# Patient Record
Sex: Female | Born: 1940 | Race: White | Hispanic: No | Smoking: Never smoker
Health system: Southern US, Community
[De-identification: ages and names within clinical notes are randomized; demographics above are authoritative.]

## PROBLEM LIST (undated history)

## (undated) DIAGNOSIS — H269 Unspecified cataract: Secondary | ICD-10-CM

## (undated) DIAGNOSIS — H3530 Unspecified macular degeneration: Secondary | ICD-10-CM

## (undated) DIAGNOSIS — G8929 Other chronic pain: Secondary | ICD-10-CM

## (undated) DIAGNOSIS — R7303 Prediabetes: Secondary | ICD-10-CM

## (undated) DIAGNOSIS — Z972 Presence of dental prosthetic device (complete) (partial): Secondary | ICD-10-CM

## (undated) DIAGNOSIS — F32A Depression, unspecified: Secondary | ICD-10-CM

## (undated) DIAGNOSIS — F419 Anxiety disorder, unspecified: Secondary | ICD-10-CM

## (undated) DIAGNOSIS — R51 Headache: Secondary | ICD-10-CM

## (undated) DIAGNOSIS — K5792 Diverticulitis of intestine, part unspecified, without perforation or abscess without bleeding: Secondary | ICD-10-CM

## (undated) DIAGNOSIS — F329 Major depressive disorder, single episode, unspecified: Secondary | ICD-10-CM

## (undated) DIAGNOSIS — K5790 Diverticulosis of intestine, part unspecified, without perforation or abscess without bleeding: Secondary | ICD-10-CM

## (undated) DIAGNOSIS — D649 Anemia, unspecified: Secondary | ICD-10-CM

## (undated) DIAGNOSIS — G43909 Migraine, unspecified, not intractable, without status migrainosus: Secondary | ICD-10-CM

## (undated) DIAGNOSIS — I4510 Unspecified right bundle-branch block: Secondary | ICD-10-CM

## (undated) DIAGNOSIS — H547 Unspecified visual loss: Secondary | ICD-10-CM

## (undated) DIAGNOSIS — K649 Unspecified hemorrhoids: Secondary | ICD-10-CM

## (undated) DIAGNOSIS — I10 Essential (primary) hypertension: Secondary | ICD-10-CM

## (undated) DIAGNOSIS — R197 Diarrhea, unspecified: Secondary | ICD-10-CM

## (undated) DIAGNOSIS — Z780 Asymptomatic menopausal state: Secondary | ICD-10-CM

## (undated) DIAGNOSIS — Z8619 Personal history of other infectious and parasitic diseases: Secondary | ICD-10-CM

## (undated) DIAGNOSIS — K219 Gastro-esophageal reflux disease without esophagitis: Secondary | ICD-10-CM

## (undated) DIAGNOSIS — M1990 Unspecified osteoarthritis, unspecified site: Secondary | ICD-10-CM

## (undated) DIAGNOSIS — Z87440 Personal history of urinary (tract) infections: Secondary | ICD-10-CM

## (undated) DIAGNOSIS — R519 Headache, unspecified: Secondary | ICD-10-CM

## (undated) DIAGNOSIS — IMO0001 Reserved for inherently not codable concepts without codable children: Secondary | ICD-10-CM

## (undated) DIAGNOSIS — Z9289 Personal history of other medical treatment: Secondary | ICD-10-CM

## (undated) HISTORY — DX: Unspecified right bundle-branch block: I45.10

## (undated) HISTORY — PX: ESOPHAGOGASTRODUODENOSCOPY: SHX1529

## (undated) HISTORY — PX: DILATION AND CURETTAGE OF UTERUS: SHX78

## (undated) HISTORY — DX: Essential (primary) hypertension: I10

## (undated) HISTORY — PX: KNEE ARTHROSCOPY: SUR90

## (undated) HISTORY — DX: Migraine, unspecified, not intractable, without status migrainosus: G43.909

## (undated) HISTORY — PX: COLONOSCOPY: SHX174

## (undated) HISTORY — PX: EYE SURGERY: SHX253

## (undated) HISTORY — PX: FOOT SURGERY: SHX648

## (undated) HISTORY — DX: Unspecified osteoarthritis, unspecified site: M19.90

---
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
## 1977-10-07 HISTORY — PX: NASAL SINUS SURGERY: SHX719

## 1981-10-07 HISTORY — PX: TOTAL ABDOMINAL HYSTERECTOMY: SHX209

## 1993-10-07 HISTORY — PX: CHOLECYSTECTOMY: SHX55

## 2008-10-07 HISTORY — PX: CATARACT EXTRACTION: SUR2

## 2012-01-02 ENCOUNTER — Encounter (INDEPENDENT_AMBULATORY_CARE_PROVIDER_SITE_OTHER): Payer: Self-pay | Admitting: *Deleted

## 2012-01-14 ENCOUNTER — Encounter (INDEPENDENT_AMBULATORY_CARE_PROVIDER_SITE_OTHER): Payer: Self-pay | Admitting: Internal Medicine

## 2012-01-14 ENCOUNTER — Ambulatory Visit (INDEPENDENT_AMBULATORY_CARE_PROVIDER_SITE_OTHER): Payer: Self-pay | Admitting: Internal Medicine

## 2012-01-14 ENCOUNTER — Other Ambulatory Visit (INDEPENDENT_AMBULATORY_CARE_PROVIDER_SITE_OTHER): Payer: Self-pay | Admitting: *Deleted

## 2012-01-14 VITALS — BP 164/100 | HR 76 | Temp 98.4°F | Ht 65.5 in | Wt 194.1 lb

## 2012-01-14 DIAGNOSIS — R194 Change in bowel habit: Secondary | ICD-10-CM

## 2012-01-14 DIAGNOSIS — R1031 Right lower quadrant pain: Secondary | ICD-10-CM

## 2012-01-14 DIAGNOSIS — M1990 Unspecified osteoarthritis, unspecified site: Secondary | ICD-10-CM | POA: Insufficient documentation

## 2012-01-14 DIAGNOSIS — R197 Diarrhea, unspecified: Secondary | ICD-10-CM

## 2012-01-14 DIAGNOSIS — I10 Essential (primary) hypertension: Secondary | ICD-10-CM | POA: Insufficient documentation

## 2012-01-14 DIAGNOSIS — R198 Other specified symptoms and signs involving the digestive system and abdomen: Secondary | ICD-10-CM

## 2012-01-14 DIAGNOSIS — K219 Gastro-esophageal reflux disease without esophagitis: Secondary | ICD-10-CM

## 2012-01-14 NOTE — Patient Instructions (Signed)
EGD/ED and colonoscopy

## 2012-01-14 NOTE — Progress Notes (Signed)
Subjective:     Patient ID: Bonnie Gray, female   DOB: 02-Apr-1941, 71 y.o.   MRN: [id number]  HPI Bonnie Gray is a 71 yr old female referred to our office by Dr. Meredith Mody for rt abdominal pain and diarrhea since February  She was given Nexium which helped.  She started having diarrhea.  She would have 15 loose stools a day. She saw Dr Willaim Bane again and was given Robinul 1mg  .  Now she usually has 5-15 stools a day. She usually only has 2 a day.  Her first BM is normal, then the second one will be looser, and as the day goes on her stools become more looser. She has a lot of indigestion and gas. When she goes to be at night, she sleeps on her rt side. She feels like she is lying on a sock or a rock. She feels a pressure there.   NO melena or bright red rectal bleeding. Her last colonoscopy was in early 2000 by Dr. Cleotis Nipper and was normal.   She also c/o that foods are slow to go down. She will quit eating and the food will eventually go down.  After she eats she will bloat. She underwent an EGD in the 70's for dysphagia and was told her pylorus was very small.   Review of Systems see hpi Current Outpatient Prescriptions  Medication Sig Dispense Refill  . aluminum hydroxide-magnesium carbonate (GAVISCON) 31.7-137 MG/5ML suspension Take by mouth every 6 (six) hours as needed.      Marland Kitchen aspirin 81 MG tablet Take 81 mg by mouth daily.      . Azilsartan-Chlorthalidone (EDARBYCLOR) 40-12.5 MG TABS Take by mouth.      . beta carotene w/minerals (OCUVITE) tablet Take 1 tablet by mouth daily.      . Cholecalciferol (VITAMIN D-3 PO) Take by mouth.      Marland Kitchen glycopyrrolate (ROBINUL) 1 MG tablet Take 1 mg by mouth 3 (three) times daily.      Marland Kitchen LORazepam (ATIVAN) 1 MG tablet Take 1 mg by mouth every 8 (eight) hours.      . Multiple Vitamins-Minerals (CENTRUM SILVER PO) Take by mouth.      . Multiple Vitamins-Minerals (PRESERVISION AREDS) TABS Take by mouth.      . naproxen sodium (ANAPROX) 220 MG tablet Take 220  mg by mouth 2 (two) times daily with a meal.      . phenylephrine-shark liver oil-mineral oil-petrolatum (PREPARATION H) 0.25-3-14-71.9 % rectal ointment Place rectally 2 (two) times daily as needed.       Past Medical History  Diagnosis Date  . Hypertension   . Osteoarthritis     knees   Past Surgical History  Procedure Date  . Knee arthroscopy     2009  . Cholecystectomy   . Total abdominal hysterectomy     endometriosis  . Foot surgery     bunion and growth in arch. ( left foot)  . Appendectomy    History   Social History  . Marital Status: Divorced    Spouse Name: N/A    Number of Children: N/A  . Years of Education: N/A   Occupational History  . Not on file.   Social History Main Topics  . Smoking status: Never Smoker   . Smokeless tobacco: Not on file  . Alcohol Use: No  . Drug Use: No  . Sexually Active: Not on file   Other Topics Concern  . Not on file   Social  History Narrative  . No narrative on file      Objective:   Physical Exam Filed Vitals:   01/14/12 1115  Height: 5' 5.5" (1.664 m)  Weight: 194 lb 1.6 oz (88.043 kg)  Alert and oriented. Skin warm and dry. Oral mucosa is moist.   . Sclera anicteric, conjunctivae is pink. Thyroid not enlarged. No cervical lymphadenopathy. Lungs clear. Heart regular rate and rhythm.  Abdomen is soft. Bowel sounds are positive. No hepatomegaly. No abdominal masses felt. No tenderness.  No edema to lower extremities. Patient is alert and oriented.       Assessment:    Diarrhea, rt lower abdominal pain. Colonic neoplasm needs to be ruled out. Inflammatory process also needs to be ruled out.  Dysphagia: Esophageal stricture, ring needs to be ruled out.    Plan:    EGD/Colonoscopy in the near future.The risks and benefits such as perforation, bleeding, and infection were reviewed with the patient and is agreeable.   Rx for Nexium sent to her pharmacy

## 2012-01-15 ENCOUNTER — Encounter (HOSPITAL_COMMUNITY): Payer: Self-pay | Admitting: Pharmacy Technician

## 2012-01-15 MED ORDER — SODIUM CHLORIDE 0.45 % IV SOLN
Freq: Once | INTRAVENOUS | Status: AC
  Administered 2012-01-16: 12:00:00 via INTRAVENOUS

## 2012-01-16 ENCOUNTER — Encounter (HOSPITAL_COMMUNITY)
Admission: RE | Disposition: A | Discharge status: Home / Self Care | Payer: Self-pay | Source: Ambulatory Visit | Attending: Internal Medicine

## 2012-01-16 ENCOUNTER — Ambulatory Visit (HOSPITAL_COMMUNITY)
Admission: RE | Admit: 2012-01-16 | Discharge: 2012-01-16 | Disposition: A | Discharge status: Home / Self Care | Payer: Medicare Other | Source: Ambulatory Visit | Attending: Internal Medicine | Admitting: Internal Medicine

## 2012-01-16 ENCOUNTER — Encounter (HOSPITAL_COMMUNITY): Payer: Self-pay | Admitting: *Deleted

## 2012-01-16 DIAGNOSIS — R1031 Right lower quadrant pain: Secondary | ICD-10-CM

## 2012-01-16 DIAGNOSIS — K222 Esophageal obstruction: Secondary | ICD-10-CM | POA: Insufficient documentation

## 2012-01-16 DIAGNOSIS — D126 Benign neoplasm of colon, unspecified: Secondary | ICD-10-CM

## 2012-01-16 DIAGNOSIS — R197 Diarrhea, unspecified: Secondary | ICD-10-CM

## 2012-01-16 DIAGNOSIS — R194 Change in bowel habit: Secondary | ICD-10-CM

## 2012-01-16 DIAGNOSIS — Z79899 Other long term (current) drug therapy: Secondary | ICD-10-CM | POA: Insufficient documentation

## 2012-01-16 DIAGNOSIS — K2100 Gastro-esophageal reflux disease with esophagitis, without bleeding: Secondary | ICD-10-CM | POA: Insufficient documentation

## 2012-01-16 DIAGNOSIS — K208 Other esophagitis without bleeding: Secondary | ICD-10-CM

## 2012-01-16 DIAGNOSIS — K449 Diaphragmatic hernia without obstruction or gangrene: Secondary | ICD-10-CM

## 2012-01-16 DIAGNOSIS — K219 Gastro-esophageal reflux disease without esophagitis: Secondary | ICD-10-CM

## 2012-01-16 DIAGNOSIS — R1310 Dysphagia, unspecified: Secondary | ICD-10-CM

## 2012-01-16 DIAGNOSIS — K644 Residual hemorrhoidal skin tags: Secondary | ICD-10-CM | POA: Insufficient documentation

## 2012-01-16 DIAGNOSIS — I10 Essential (primary) hypertension: Secondary | ICD-10-CM | POA: Insufficient documentation

## 2012-01-16 DIAGNOSIS — K6389 Other specified diseases of intestine: Secondary | ICD-10-CM

## 2012-01-16 HISTORY — DX: Diarrhea, unspecified: R19.7

## 2012-01-16 HISTORY — PX: MALONEY DILATION: SHX5535

## 2012-01-16 SURGERY — COLONOSCOPY WITH ESOPHAGOGASTRODUODENOSCOPY (EGD)
Anesthesia: Moderate Sedation

## 2012-01-16 MED ORDER — MIDAZOLAM HCL 5 MG/5ML IJ SOLN
INTRAMUSCULAR | Status: DC | PRN
  Administered 2012-01-16: 1 mg via INTRAVENOUS
  Administered 2012-01-16 (×2): 2 mg via INTRAVENOUS
  Administered 2012-01-16 (×2): 1 mg via INTRAVENOUS
  Administered 2012-01-16: 2 mg via INTRAVENOUS
  Administered 2012-01-16: 1 mg via INTRAVENOUS

## 2012-01-16 MED ORDER — MEPERIDINE HCL 50 MG/ML IJ SOLN
INTRAMUSCULAR | Status: AC
  Filled 2012-01-16: qty 1

## 2012-01-16 MED ORDER — DICYCLOMINE HCL 10 MG PO CAPS: 10.0000 mg | ORAL_CAPSULE | Freq: Three times a day (TID) | ORAL | Status: DC

## 2012-01-16 MED ORDER — MEPERIDINE HCL 50 MG/ML IJ SOLN
INTRAMUSCULAR | Status: DC | PRN
Start: 2012-01-16 — End: 2012-01-16
  Administered 2012-01-16 (×4): 25 mg via SUBCUTANEOUS

## 2012-01-16 MED ORDER — MIDAZOLAM HCL 5 MG/5ML IJ SOLN
INTRAMUSCULAR | Status: AC
  Filled 2012-01-16: qty 10

## 2012-01-16 MED ORDER — BUTAMBEN-TETRACAINE-BENZOCAINE 2-2-14 % EX AERO
INHALATION_SPRAY | CUTANEOUS | Status: DC | PRN
  Administered 2012-01-16: 2 via TOPICAL

## 2012-01-16 NOTE — Discharge Instructions (Signed)
No aspirin for one week. Dicyclomine 10 mg by mouth daily before. Stop Robinul. No driving for 24 hours. Continue anti-reflux measures and Nexium as before. Physician will contact you with biopsy resultsGastroesophageal Reflux Disease, Adult Gastroesophageal reflux disease (GERD) happens when acid from your stomach flows up into the esophagus. When acid comes in contact with the esophagus, the acid causes soreness (inflammation) in the esophagus. Over time, GERD may create small holes (ulcers) in the lining of the esophagus. CAUSES   Increased body weight. This puts pressure on the stomach, making acid rise from the stomach into the esophagus.   Smoking. This increases acid production in the stomach.   Drinking alcohol. This causes decreased pressure in the lower esophageal sphincter (valve or ring of muscle between the esophagus and stomach), allowing acid from the stomach into the esophagus.   Late evening meals and a full stomach. This increases pressure and acid production in the stomach.   A malformed lower esophageal sphincter.  Sometimes, no cause is found. SYMPTOMS   Burning pain in the lower part of the mid-chest behind the breastbone and in the mid-stomach area. This may occur twice a week or more often.   Trouble swallowing.   Sore throat.   Dry cough.   Asthma-like symptoms including chest tightness, shortness of breath, or wheezing.  DIAGNOSIS  Your caregiver may be able to diagnose GERD based on your symptoms. In some cases, X-rays and other tests may be done to check for complications or to check the condition of your stomach and esophagus. TREATMENT  Your caregiver may recommend over-the-counter or prescription medicines to help decrease acid production. Ask your caregiver before starting or adding any new medicines.  HOME CARE INSTRUCTIONS   Change the factors that you can control. Ask your caregiver for guidance concerning weight loss, quitting smoking, and  alcohol consumption.   Avoid foods and drinks that make your symptoms worse, such as:   Caffeine or alcoholic drinks.   Chocolate.   Peppermint or mint flavorings.   Garlic and onions.   Spicy foods.   Citrus fruits, such as oranges, lemons, or limes.   Tomato-based foods such as sauce, chili, salsa, and pizza.   Fried and fatty foods.   Avoid lying down for the 3 hours prior to your bedtime or prior to taking a nap.   Eat small, frequent meals instead of large meals.   Wear loose-fitting clothing. Do not wear anything tight around your waist that causes pressure on your stomach.   Raise the head of your bed 6 to 8 inches with wood blocks to help you sleep. Extra pillows will not help.   Only take over-the-counter or prescription medicines for pain, discomfort, or fever as directed by your caregiver.   Do not take aspirin, ibuprofen, or other nonsteroidal anti-inflammatory drugs (NSAIDs).  SEEK IMMEDIATE MEDICAL CARE IF:   You have pain in your arms, neck, jaw, teeth, or back.   Your pain increases or changes in intensity or duration.   You develop nausea, vomiting, or sweating (diaphoresis).   You develop shortness of breath, or you faint.   Your vomit is green, yellow, black, or looks like coffee grounds or blood.   Your stool is red, bloody, or black.  These symptoms could be signs of other problems, such as heart disease, gastric bleeding, or esophageal bleeding. MAKE SURE YOU:   Understand these instructions.   Will watch your condition.   Will get help right away if you are  not doing well or get worse.  Document Released: 07/03/2005 Document Revised: 09/12/2011 Document Reviewed: 04/12/2011 [facility] Patient Information 2012 [place], [place].Hemorrhoids Hemorrhoids are enlarged (dilated) veins around the rectum. There are 2 types of hemorrhoids, and the type of hemorrhoid is determined by its location. Internal hemorrhoids occur in the veins just inside the  rectum.They are usually not painful, but they may bleed.However, they may poke through to the outside and become irritated and painful. External hemorrhoids involve the veins outside the anus and can be felt as a painful swelling or hard lump near the anus.They are often itchy and may crack and bleed. Sometimes clots will form in the veins. This makes them swollen and painful. These are called thrombosed hemorrhoids. CAUSES Causes of hemorrhoids include:  Pregnancy. This increases the pressure in the hemorrhoidal veins.   Constipation.   Straining to have a bowel movement.   Obesity.   Heavy lifting or other activity that caused you to strain.  TREATMENT Most of the time hemorrhoids improve in 1 to 2 weeks. However, if symptoms do not seem to be getting better or if you have a lot of rectal bleeding, your caregiver may perform a procedure to help make the hemorrhoids get smaller or remove them completely.Possible treatments include:  Rubber band ligation. A rubber band is placed at the base of the hemorrhoid to cut off the circulation.   Sclerotherapy. A chemical is injected to shrink the hemorrhoid.   Infrared light therapy. Tools are used to burn the hemorrhoid.   Hemorrhoidectomy. This is surgical removal of the hemorrhoid.  HOME CARE INSTRUCTIONS   Increase fiber in your diet. Ask your caregiver about using fiber supplements.   Drink enough water and fluids to keep your urine clear or pale yellow.   Exercise regularly.   Go to the bathroom when you have the urge to have a bowel movement. Do not wait.   Avoid straining to have bowel movements.   Keep the anal area dry and clean.   Only take over-the-counter or prescription medicines for pain, discomfort, or fever as directed by your caregiver.  If your hemorrhoids are thrombosed:  Take warm sitz baths for 20 to 30 minutes, 3 to 4 times per day.   If the hemorrhoids are very tender and swollen, place ice packs on the  area as tolerated. Using ice packs between sitz baths may be helpful. Fill a plastic bag with ice. Place a towel between the bag of ice and your skin.   Medicated creams and suppositories may be used or applied as directed.   Do not use a donut-shaped pillow or sit on the toilet for long periods. This increases blood pooling and pain.  SEEK MEDICAL CARE IF:   You have increasing pain and swelling that is not controlled with your medicine.   You have uncontrolled bleeding.   You have difficulty or you are unable to have a bowel movement.   You have pain or inflammation outside the area of the hemorrhoids.   You have chills or an oral temperature above 102 F (38.9 C).  MAKE SURE YOU:   Understand these instructions.   Will watch your condition.   Will get help right away if you are not doing well or get worse.  Document Released: 09/20/2000 Document Revised: 09/12/2011 Document Reviewed: 01/26/2008 [facility] Patient Information 2012 [place], [place].Diverticulosis Diverticulosis is a common condition that develops when small pouches (diverticula) form in the wall of the colon. The risk of diverticulosis increases  with age. It happens more often in people who eat a low-fiber diet. Most individuals with diverticulosis have no symptoms. Those individuals with symptoms usually experience abdominal pain, constipation, or loose stools (diarrhea). HOME CARE INSTRUCTIONS   Increase the amount of fiber in your diet as directed by your caregiver or dietician. This may reduce symptoms of diverticulosis.   Your caregiver may recommend taking a dietary fiber supplement.   Drink at least 6 to 8 glasses of water each day to prevent constipation.   Try not to strain when you have a bowel movement.   Your caregiver may recommend avoiding nuts and seeds to prevent complications, although this is still an uncertain benefit.   Only take over-the-counter or prescription medicines for pain, discomfort,  or fever as directed by your caregiver.  FOODS WITH HIGH FIBER CONTENT INCLUDE:  Fruits. Apple, peach, pear, tangerine, raisins, prunes.   Vegetables. Brussels sprouts, asparagus, broccoli, cabbage, carrot, cauliflower, romaine lettuce, spinach, summer squash, tomato, winter squash, zucchini.   Starchy Vegetables. Baked beans, kidney beans, lima beans, split peas, lentils, potatoes (with skin).   Grains. Whole wheat bread, brown rice, bran flake cereal, plain oatmeal, white rice, shredded wheat, bran muffins.  SEEK IMMEDIATE MEDICAL CARE IF:   You develop increasing pain or severe bloating.   You have an oral temperature above 102 F (38.9 C), not controlled by medicine.   You develop vomiting or bowel movements that are bloody or black.  Document Released: 06/20/2004 Document Revised: 09/12/2011 Document Reviewed: 02/21/2010 [facility] Patient Information 2012 [place], [place].Colon Polyps A polyp is extra tissue that grows inside your body. Colon polyps grow in the large intestine. The large intestine, also called the colon, is part of your digestive system. It is a long, hollow tube at the end of your digestive tract where your body makes and stores stool. Most polyps are not dangerous. They are benign. This means they are not cancerous. But over time, some types of polyps can turn into cancer. Polyps that are smaller than a pea are usually not harmful. But larger polyps could someday become or may already be cancerous. To be safe, doctors remove all polyps and test them.  WHO GETS POLYPS? Anyone can get polyps, but certain people are more likely than others. You may have a greater chance of getting polyps if:  You are over 50.   You have had polyps before.   Someone in your family has had polyps.   Someone in your family has had cancer of the large intestine.   Find out if someone in your family has had polyps. You may also be more likely to get polyps if you:   Eat a lot of fatty  foods.   Smoke.   Drink alcohol.   Do not exercise.   Eat too much.  SYMPTOMS  Most small polyps do not cause symptoms. People often do not know they have one until their caregiver finds it during a regular checkup or while testing them for something else. Some people do have symptoms like these:  Bleeding from the anus. You might notice blood on your underwear or on toilet paper after you have had a bowel movement.   Constipation or diarrhea that lasts more than a week.   Blood in the stool. Blood can make stool look black or it can show up as red streaks in the stool.  If you have any of these symptoms, see your caregiver. HOW DOES THE DOCTOR TEST FOR POLYPS? The  doctor can use four tests to check for polyps:  Digital rectal exam. The caregiver wears gloves and checks your rectum (the last part of the large intestine) to see if it feels normal. This test would find polyps only in the rectum. Your caregiver may need to do one of the other tests listed below to find polyps higher up in the intestine.   Barium enema. The caregiver puts a liquid called barium into your rectum before taking x-rays of your large intestine. Barium makes your intestine look white in the pictures. Polyps are dark, so they are easy to see.   Sigmoidoscopy. With this test, the caregiver can see inside your large intestine. A thin flexible tube is placed into your rectum. The device is called a sigmoidoscope, which has a light and a tiny video camera in it. The caregiver uses the sigmoidoscope to look at the last third of your large intestine.   Colonoscopy. This test is like sigmoidoscopy, but the caregiver looks at all of the large intestine. It usually requires sedation. This is the most common method for finding and removing polyps.  TREATMENT   The caregiver will remove the polyp during sigmoidoscopy or colonoscopy. The polyp is then tested for cancer.   If you have had polyps, your caregiver may want you  to get tested regularly in the future.  PREVENTION  There is not one sure way to prevent polyps. You might be able to lower your risk of getting them if you:  Eat more fruits and vegetables and less fatty food.   Do not smoke.   Avoid alcohol.   Exercise every day.   Lose weight if you are overweight.   Eating more calcium and folate can also lower your risk of getting polyps. Some foods that are rich in calcium are milk, cheese, and broccoli. Some foods that are rich in folate are chickpeas, kidney beans, and spinach.   Aspirin might help prevent polyps. Studies are under way.  Document Released: 06/19/2004 Document Revised: 09/12/2011 Document Reviewed: 11/25/2007 [facility] Patient Information 2012 [place], [place].Esophagogastroduodenoscopy This is an endoscopic procedure (a procedure that uses a device like a flexible telescope) that allows your caregiver to view the upper stomach and small bowel. This test allows your caregiver to look at the esophagus. The esophagus carries food from your mouth to your stomach. They can also look at your duodenum. This is the first part of the small intestine that attaches to the stomach. This test is used to detect problems in the bowel such as ulcers and inflammation. PREPARATION FOR TEST Nothing to eat after midnight the day before the test. NORMAL FINDINGS Normal esophagus, stomach, and duodenum. Ranges for normal findings may vary among different laboratories and hospitals. You should always check with your doctor after having lab work or other tests done to discuss the meaning of your test results and whether your values are considered within normal limits. MEANING OF TEST  Your caregiver will go over the test results with you and discuss the importance and meaning of your results, as well as treatment options and the need for additional tests if necessary. OBTAINING THE TEST RESULTS It is your responsibility to obtain your test results. Ask the  lab or department performing the test when and how you will get your results. Document Released: 01/24/2005 Document Revised: 09/12/2011 Document Reviewed: 09/02/2008 [facility] Patient Information 2012 [place], [place].Colonoscopy Care After Read the instructions outlined below and refer to this sheet in the next few weeks.  These discharge instructions provide you with general information on caring for yourself after you leave the hospital. Your doctor may also give you specific instructions. While your treatment has been planned according to the most current medical practices available, unavoidable complications occasionally occur. If you have any problems or questions after discharge, call your doctor. HOME CARE INSTRUCTIONS ACTIVITY:  You may resume your regular activity, but move at a slower pace for the next 24 hours.   Take frequent rest periods for the next 24 hours.   Walking will help get rid of the air and reduce the bloated feeling in your belly (abdomen).   No driving for 24 hours (because of the medicine (anesthesia) used during the test).   You may shower.   Do not sign any important legal documents or operate any machinery for 24 hours (because of the anesthesia used during the test).  NUTRITION:  Drink plenty of fluids.   You may resume your normal diet as instructed by your doctor.   Begin with a light meal and progress to your normal diet. Heavy or fried foods are harder to digest and may make you feel sick to your stomach (nauseated).   Avoid alcoholic beverages for 24 hours or as instructed.  MEDICATIONS:  You may resume your normal medications unless your doctor tells you otherwise.  WHAT TO EXPECT TODAY:  Some feelings of bloating in the abdomen.   Passage of more gas than usual.   Spotting of blood in your stool or on the toilet paper.  IF YOU HAD POLYPS REMOVED DURING THE COLONOSCOPY:  No aspirin products for 7 days or as instructed.   No alcohol for 7  days or as instructed.   Eat a soft diet for the next 24 hours.  FINDING OUT THE RESULTS OF YOUR TEST Not all test results are available during your visit. If your test results are not back during the visit, make an appointment with your caregiver to find out the results. Do not assume everything is normal if you have not heard from your caregiver or the medical facility. It is important for you to follow up on all of your test results.  SEEK IMMEDIATE MEDICAL CARE IF:  You have more than a spotting of blood in your stool.   Your belly is swollen (abdominal distention).   You are nauseated or vomiting.   You have a fever.   You have abdominal pain or discomfort that is severe or gets worse throughout the day.  Document Released: 05/07/2004 Document Revised: 09/12/2011 Document Reviewed: 05/05/2008 [facility] Patient Information 2012 [place], [place].

## 2012-01-16 NOTE — Op Note (Signed)
EGD PROCEDURE REPORT  PATIENT:  Bonnie Gray  MR#:  [id number] Birthdate:  1941/07/10, 71 y.o., female Endoscopist:  Dr. Malissa Hippo, MD Referred By:  Dr. Ardyth Man, M.D. Procedure Date: 01/16/2012  Procedure:   EGD, ED & Colonoscopy.  Indications:  Patient is a 71 year old Caucasian female who was dysphagia primarily to solids. She also has diarrhea of 4 months duration.             Informed Consent:  The risks, benefits, alternatives & imponderables which include, but are not limited to, bleeding, infection, perforation, drug reaction and potential missed lesion have been reviewed.  The potential for biopsy, lesion removal, esophageal dilation, etc. have also been discussed.  Questions have been answered.  All parties agreeable.  Please see history & physical in medical record for more information.  Medications:  Demerol 100 mg IV Versed 10 mg IV Cetacaine spray topically for oropharyngeal anesthesia  EGD  Description of procedure:  The endoscope was introduced through the mouth and advanced to the second portion of the duodenum without difficulty or limitations. The mucosal surfaces were surveyed very carefully during advancement of the scope and upon withdrawal.  Findings:  Esophagus:  Mucosa of the esophagus was normal with exception of single erosion at GE junction. It was 5 cm long. Non-critical ring at GE junction. GEJ:  35 cm Hiatus:  38  cm Stomach:  Bonnie Gray was empty and distended very well with insufflation. Fold in the proximal stomach are normal. Examination mucosa body, antrum, pyloric channel, and there is, fundus and cardia was normal. Duodenum:  Normal bulbar and post bulbar mucosa.  Therapeutic/Diagnostic Maneuvers Performed:  Esophagus dilated by passing a 54 French Maloney dilator for the insertion. 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  COLONOSCOPY Description of procedure:   After a digital rectal exam was performed, that colonoscope was advanced from the anus through the rectum and colon to the area of the cecum, ileocecal valve and appendiceal orifice. The cecum was deeply intubated. These structures were well-seen and photographed for the record. From the level of the cecum and ileocecal valve, the scope was slowly and cautiously withdrawn. The mucosal surfaces were carefully surveyed utilizing scope tip to flexion to facilitate fold flattening as needed. The scope was pulled down into the rectum where a thorough exam including retroflexion was performed.  Findings:   Prep excellent. 8 mm polyp on a fold at the hepatic flexure with difficult approach. Initially it was biopsied and residual polyp snared. Mucosa of the rest of the colon was normal. 2 diverticula and sigmoid colon. MM biopsy taken from mucosa of sigmoid colon looking for microscopic or collagenous colitis. Normal rectal mucosa. Thickened anoderm and small hemorrhoids below the dentate line.  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 Esophagus dilated by passing 54 [place] Maloney dilators of the mucosal disruption and proximal esophagus indicative of web.  8 mm polyp at hepatic flexure removed with combination of biopsy and hot snare(difficult approach). No endoscopic evidence of colitis. Cozaar biopsy taken from sigmoid colon to rule out microscopic and/or collagenous colitis. Small external hemorrhoids.  Recommendations:  Standard instructions given. Discontinue Robinul and start dicyclomine 10 mg by mouth every morning. I will contact patient with results of biopsy and further recommendations.  Bonnie Gray  01/16/2012  2:00 PM  CC: Dr. Ardyth Man, MD, MD & Dr. Bonnetta Barry ref. provider found

## 2012-01-16 NOTE — H&P (Signed)
Bonnie Gray is an 71 y.o. female.   Chief Complaint: Patient is here for EGD, EGD and colonoscopy. HPI: Patient is a 71 year-old Caucasian female who presents to the GI symptoms. She complains of intermittent dysphagia primarily to solids and occasionally to liquids. She points to her suprasternal area where the food seems to hang  before it goes down. She says her heartburn is better since she was begun on Nexium for GERD she is still having intermittent heartburn. She denies epigastric pain or melena. She also complains of diarrhea of 4 months duration. She has anywhere from 5-15 stools per day. Her stool is usually performed and subsequent stools were loose. She is hematochezia she believes is coming from hemorrhoids. She also complains of right-sided abdominal pain. She feels as if she is lump in this area. She also complains of flatulence. She says she has gained 40 pounds over the last 5 years. Most of this occurred while she was caring for her sick mother. Past Medical History  Diagnosis Date  . Hypertension   . Osteoarthritis     knees  . Diarrhea   . Dysphagia     Past Surgical History  Procedure Date  . Knee arthroscopy     2009  . Cholecystectomy   . Total abdominal hysterectomy     endometriosis  . Foot surgery     bunion and growth in arch. ( left foot)  . Appendectomy   . Colonoscopy   . Esophagogastroduodenoscopy     Family History  Problem Relation Age of Onset  . Colon cancer Neg Hx    Social History:  reports that she has never smoked. She does not have any smokeless tobacco history on file. She reports that she does not drink alcohol or use illicit drugs.  Allergies:  Allergies  Allergen Reactions  . Celebrex (Celecoxib)     hives  . Norvasc (Amlodipine Besylate)     Rash    Medications Prior to Admission  Medication Dose Route Frequency Provider Last Rate Last Dose  . 0.45 % sodium chloride infusion   Intravenous Once Malissa Hippo, MD 20 mL/hr  at 01/16/12 1202    . meperidine (DEMEROL) 50 MG/ML injection           . midazolam (VERSED) 5 MG/5ML injection            No current outpatient prescriptions on file as of 01/16/2012.    No results found for this or any previous visit (from the past 48 hour(s)). No results found.  Review of Systems  Constitutional: Negative for weight loss.  Gastrointestinal: Negative for constipation and melena.    Blood pressure 143/77, pulse 90, temperature 98 F (36.7 C), temperature source Oral, resp. rate 20, height 5' 5.5" (1.664 m), weight 194 lb (87.998 kg), SpO2 97.00%. Physical Exam  Constitutional: She appears well-developed and well-nourished.  HENT:  Mouth/Throat: Oropharynx is clear and moist.  Eyes: Conjunctivae are normal. No scleral icterus.  Neck: No thyromegaly present.  Cardiovascular: Normal rate, regular rhythm and normal heart sounds.   No murmur heard. Respiratory: Effort normal and breath sounds normal.  GI: Soft. She exhibits no mass. There is Tenderness: mild tenderness at RUQ and LUQ.Marland Kitchen  Musculoskeletal: She exhibits no edema.  Lymphadenopathy:    She has no cervical adenopathy.  Neurological: She is alert.  Skin: Skin is warm and dry.     Assessment/Plan Dysphagia. Diarrhea. EGD with ED and colonoscopy  Bonnie Gray 01/16/2012, 12:55 PM

## 2012-01-22 ENCOUNTER — Encounter (INDEPENDENT_AMBULATORY_CARE_PROVIDER_SITE_OTHER): Payer: Self-pay

## 2012-01-22 ENCOUNTER — Encounter (HOSPITAL_COMMUNITY): Payer: Self-pay | Admitting: Internal Medicine

## 2012-01-25 ENCOUNTER — Telehealth (INDEPENDENT_AMBULATORY_CARE_PROVIDER_SITE_OTHER): Payer: Self-pay | Admitting: Internal Medicine

## 2012-01-26 NOTE — Telephone Encounter (Signed)
error 

## 2012-01-28 ENCOUNTER — Telehealth (INDEPENDENT_AMBULATORY_CARE_PROVIDER_SITE_OTHER): Payer: Self-pay | Admitting: *Deleted

## 2012-01-28 NOTE — Telephone Encounter (Signed)
Patient called to let Dr. Karilyn Cota know her diarrhea is better with medication and diet changes. She also said thank you for the call.

## 2012-01-28 NOTE — Telephone Encounter (Signed)
Nice to know  that patient is doing better

## 2012-01-29 ENCOUNTER — Encounter (INDEPENDENT_AMBULATORY_CARE_PROVIDER_SITE_OTHER): Payer: Self-pay | Admitting: *Deleted

## 2014-11-08 NOTE — Progress Notes (Signed)
Please put orders in Epic surgery 11-18-14 pre op 11-11-14 Thanks

## 2014-11-10 ENCOUNTER — Ambulatory Visit: Payer: Self-pay | Admitting: Orthopedic Surgery

## 2014-11-10 NOTE — Progress Notes (Signed)
Preoperative surgical orders have been place into the Epic hospital system for Bonnie Gray on 11/10/2014, 1:09 PM  by Mickel Crow for surgery on 11-18-2014.  Preop Total Knee orders including Experal, IV Tylenol, and IV Decadron as long as there are no contraindications to the above medications. [name], PA-C

## 2014-11-11 ENCOUNTER — Inpatient Hospital Stay (HOSPITAL_COMMUNITY): Admission: RE | Admit: 2014-11-11 | Payer: Self-pay | Source: Ambulatory Visit

## 2014-11-14 NOTE — Patient Instructions (Addendum)
Bonnie Gray  11/14/2014   Your procedure is scheduled on: 11/18/2014    Report to [facility] Main  Entrance and follow signs to               [place] at       130pm  Call this number if you have problems the morning of surgery [phone]   Remember:  Do not eat food after midnight. May have clear liquids until 0830am then nothing by mouth.       Take these medicines the morning of surgery with A SIP OF WATER: eye drops, loratadine, flonase                               You may not have any metal on your body including hair pins and              piercings  Do not wear jewelry, make-up, lotions, powders or perfumes.             Do not wear nail polish.  Do not shave  48 hours prior to surgery.   Do not bring valuables to the hospital. [place].  Contacts, dentures or bridgework may not be worn into surgery.  Leave suitcase in the car. After surgery it may be brought to your room.               Please read over the following fact sheets you were given: MRSA information  _____________________________________________________________________             [facility] - Preparing for Surgery Before surgery, you can play an important role.  Because skin is not sterile, your skin needs to be as free of germs as possible.  You can reduce the number of germs on your skin by washing with CHG (chlorahexidine gluconate) soap before surgery.  CHG is an antiseptic cleaner which kills germs and bonds with the skin to continue killing germs even after washing. Please DO NOT use if you have an allergy to CHG or antibacterial soaps.  If your skin becomes reddened/irritated stop using the CHG and inform your nurse when you arrive at Short Stay. Do not shave (including legs and underarms) for at least 48 hours prior to the first CHG shower.  You may shave your face/neck. Please follow these instructions  carefully:  1.  Shower with CHG Soap the night before surgery and the  morning of Surgery.  2.  If you choose to wash your hair, wash your hair first as usual with your  normal  shampoo.  3.  After you shampoo, rinse your hair and body thoroughly to remove the  shampoo.                            4.  Use CHG as you would any other liquid soap.  You can apply chg directly  to the skin and wash                       Gently with a scrungie or clean washcloth.  5.  Apply the CHG Soap to your body ONLY FROM THE NECK DOWN.   Do not  use on face/ open                           Wound or open sores. Avoid contact with eyes, ears mouth and genitals (private parts).                       Wash face,  Genitals (private parts) with your normal soap.             6.  Wash thoroughly, paying special attention to the area where your surgery  will be performed.  7.  Thoroughly rinse your body with warm water from the neck down.  8.  DO NOT shower/wash with your normal soap after using and rinsing off  the CHG Soap.                9.  Pat yourself dry with a clean towel.            10.  Wear clean pajamas.            11.  Place clean sheets on your bed the night of your first shower and do not  sleep with pets. Day of Surgery : Do not apply any lotions/deodorants the morning of surgery.  Please wear clean clothes to the hospital/surgery center.  FAILURE TO FOLLOW THESE INSTRUCTIONS MAY RESULT IN THE CANCELLATION OF YOUR SURGERY PATIENT SIGNATURE_________________________________  NURSE SIGNATURE__________________________________  ________________________________________________________________________    CLEAR LIQUID DIET   Foods Allowed                                                                     Foods Excluded  Coffee and tea, regular and decaf                             liquids that you cannot  Plain Jell-O in any flavor                                             see through such as: Fruit  ices (not with fruit pulp)                                     milk, soups, orange juice  Iced Popsicles                                    All solid food Carbonated beverages, regular and diet                                    Cranberry, grape and apple juices Sports drinks like Gatorade Lightly seasoned clear broth or consume(fat free) Sugar, honey syrup  Sample Menu Breakfast  Lunch                                     Supper Cranberry juice                    Beef broth                            Chicken broth Jell-O                                     Grape juice                           Apple juice Coffee or tea                        Jell-O                                      Popsicle                                                Coffee or tea                        Coffee or tea  _____________________________________________________________________   WHAT IS A BLOOD TRANSFUSION? Blood Transfusion Information  A transfusion is the replacement of blood or some of its parts. Blood is made up of multiple cells which provide different functions.  Red blood cells carry oxygen and are used for blood loss replacement.  White blood cells fight against infection.  Platelets control bleeding.  Plasma helps clot blood.  Other blood products are available for specialized needs, such as hemophilia or other clotting disorders. BEFORE THE TRANSFUSION  Who gives blood for transfusions?   Healthy volunteers who are fully evaluated to make sure their blood is safe. This is blood bank blood. Transfusion therapy is the safest it has ever been in the practice of medicine. Before blood is taken from a donor, a complete history is taken to make sure that person has no history of diseases nor engages in risky social behavior (examples are intravenous drug use or sexual activity with multiple partners). The donor's travel history is screened to minimize risk of  transmitting infections, such as malaria. The donated blood is tested for signs of infectious diseases, such as HIV and hepatitis. The blood is then tested to be sure it is compatible with you in order to minimize the chance of a transfusion reaction. If you or a relative donates blood, this is often done in anticipation of surgery and is not appropriate for emergency situations. It takes many days to process the donated blood. RISKS AND COMPLICATIONS Although transfusion therapy is very safe and saves many lives, the main dangers of transfusion include:  1. Getting an infectious disease. 2. Developing a transfusion reaction. This is an allergic reaction to something in the blood you were given. Every precaution is taken to prevent this. The decision to have  a blood transfusion has been considered carefully by your caregiver before blood is given. Blood is not given unless the benefits outweigh the risks. AFTER THE TRANSFUSION  Right after receiving a blood transfusion, you will usually feel much better and more energetic. This is especially true if your red blood cells have gotten low (anemic). The transfusion raises the level of the red blood cells which carry oxygen, and this usually causes an energy increase.  The nurse administering the transfusion will monitor you carefully for complications. HOME CARE INSTRUCTIONS  No special instructions are needed after a transfusion. You may find your energy is better. Speak with your caregiver about any limitations on activity for underlying diseases you may have. SEEK MEDICAL CARE IF:   Your condition is not improving after your transfusion.  You develop redness or irritation at the intravenous (IV) site. SEEK IMMEDIATE MEDICAL CARE IF:  Any of the following symptoms occur over the next 12 hours:  Shaking chills.  You have a temperature by mouth above 102 F (38.9 C), not controlled by medicine.  Chest, back, or muscle pain.  People around you  feel you are not acting correctly or are confused.  Shortness of breath or difficulty breathing.  Dizziness and fainting.  You get a rash or develop hives.  You have a decrease in urine output.  Your urine turns a dark color or changes to pink, red, or brown. Any of the following symptoms occur over the next 10 days:  You have a temperature by mouth above 102 F (38.9 C), not controlled by medicine.  Shortness of breath.  Weakness after normal activity.  The white part of the eye turns yellow (jaundice).  You have a decrease in the amount of urine or are urinating less often.  Your urine turns a dark color or changes to pink, red, or brown. Document Released: 09/20/2000 Document Revised: 12/16/2011 Document Reviewed: 05/09/2008 ExitCare Patient Information 2014 [place].  _______________________________________________________________________  Incentive Spirometer  An incentive spirometer is a tool that can help keep your lungs clear and active. This tool measures how well you are filling your lungs with each breath. Taking long deep breaths may help reverse or decrease the chance of developing breathing (pulmonary) problems (especially infection) following:  A long period of time when you are unable to move or be active. BEFORE THE PROCEDURE   If the spirometer includes an indicator to show your best effort, your nurse or respiratory therapist will set it to a desired goal.  If possible, sit up straight or lean slightly forward. Try not to slouch.  Hold the incentive spirometer in an upright position. INSTRUCTIONS FOR USE  3. Sit on the edge of your bed if possible, or sit up as far as you can in bed or on a chair. 4. Hold the incentive spirometer in an upright position. 5. Breathe out normally. 6. Place the mouthpiece in your mouth and seal your lips tightly around it. 7. Breathe in slowly and as deeply as possible, raising the piston or the ball toward the top  of the column. 8. Hold your breath for 3-5 seconds or for as long as possible. Allow the piston or ball to fall to the bottom of the column. 9. Remove the mouthpiece from your mouth and breathe out normally. 10. Rest for a few seconds and repeat Steps 1 through 7 at least 10 times every 1-2 hours when you are awake. Take your time and take a few normal breaths between  deep breaths. 11. The spirometer may include an indicator to show your best effort. Use the indicator as a goal to work toward during each repetition. 12. After each set of 10 deep breaths, practice coughing to be sure your lungs are clear. If you have an incision (the cut made at the time of surgery), support your incision when coughing by placing a pillow or rolled up towels firmly against it. Once you are able to get out of bed, walk around indoors and cough well. You may stop using the incentive spirometer when instructed by your caregiver.  RISKS AND COMPLICATIONS  Take your time so you do not get dizzy or light-headed.  If you are in pain, you may need to take or ask for pain medication before doing incentive spirometry. It is harder to take a deep breath if you are having pain. AFTER USE  Rest and breathe slowly and easily.  It can be helpful to keep track of a log of your progress. Your caregiver can provide you with a simple table to help with this. If you are using the spirometer at home, follow these instructions: Center IF:   You are having difficultly using the spirometer.  You have trouble using the spirometer as often as instructed.  Your pain medication is not giving enough relief while using the spirometer.  You develop fever of 100.5 F (38.1 C) or higher. SEEK IMMEDIATE MEDICAL CARE IF:   You cough up bloody sputum that had not been present before.  You develop fever of 102 F (38.9 C) or greater.  You develop worsening pain at or near the incision site. MAKE SURE YOU:   Understand  these instructions.  Will watch your condition.  Will get help right away if you are not doing well or get worse. Document Released: 02/03/2007 Document Revised: 12/16/2011 Document Reviewed: 04/06/2007 [facility] Patient Information 2014 [place], [place].   ________________________________________________________________________

## 2014-11-15 ENCOUNTER — Encounter (HOSPITAL_COMMUNITY): Payer: Self-pay

## 2014-11-15 ENCOUNTER — Encounter (HOSPITAL_COMMUNITY)
Admission: RE | Admit: 2014-11-15 | Discharge: 2014-11-15 | Disposition: A | Discharge status: Home / Self Care | Payer: Medicare Other | Source: Ambulatory Visit | Attending: Orthopedic Surgery | Admitting: Orthopedic Surgery

## 2014-11-15 ENCOUNTER — Ambulatory Visit (HOSPITAL_COMMUNITY)
Admission: RE | Admit: 2014-11-15 | Discharge: 2014-11-15 | Disposition: A | Discharge status: Home / Self Care | Payer: Medicare Other | Source: Ambulatory Visit | Attending: Anesthesiology | Admitting: Anesthesiology

## 2014-11-15 DIAGNOSIS — Z01818 Encounter for other preprocedural examination: Secondary | ICD-10-CM | POA: Insufficient documentation

## 2014-11-15 DIAGNOSIS — R0602 Shortness of breath: Secondary | ICD-10-CM

## 2014-11-15 HISTORY — DX: Anxiety disorder, unspecified: F41.9

## 2014-11-15 HISTORY — DX: Unspecified cataract: H26.9

## 2014-11-15 HISTORY — DX: Reserved for inherently not codable concepts without codable children: IMO0001

## 2014-11-15 HISTORY — DX: Diverticulosis of intestine, part unspecified, without perforation or abscess without bleeding: K57.90

## 2014-11-15 HISTORY — DX: Anemia, unspecified: D64.9

## 2014-11-15 LAB — URINALYSIS, ROUTINE W REFLEX MICROSCOPIC
Bilirubin Urine: NEGATIVE
Glucose, UA: NEGATIVE mg/dL
HGB URINE DIPSTICK: NEGATIVE
Ketones, ur: NEGATIVE mg/dL
Leukocytes, UA: NEGATIVE
NITRITE: NEGATIVE
PROTEIN: NEGATIVE mg/dL
SPECIFIC GRAVITY, URINE: 1.007 (ref 1.005–1.030)
UROBILINOGEN UA: 0.2 mg/dL (ref 0.0–1.0)
pH: 7 (ref 5.0–8.0)

## 2014-11-15 LAB — CBC
HCT: 41.8 % (ref 36.0–46.0)
Hemoglobin: 13.6 g/dL (ref 12.0–15.0)
MCH: 29.2 pg (ref 26.0–34.0)
MCHC: 32.5 g/dL (ref 30.0–36.0)
MCV: 89.7 fL (ref 78.0–100.0)
PLATELETS: 257 10*3/uL (ref 150–400)
RBC: 4.66 MIL/uL (ref 3.87–5.11)
RDW: 13.4 % (ref 11.5–15.5)
WBC: 9.2 10*3/uL (ref 4.0–10.5)

## 2014-11-15 LAB — COMPREHENSIVE METABOLIC PANEL
ALBUMIN: 4.3 g/dL (ref 3.5–5.2)
ALK PHOS: 49 U/L (ref 39–117)
ALT: 15 U/L (ref 0–35)
AST: 18 U/L (ref 0–37)
Anion gap: 11 (ref 5–15)
BILIRUBIN TOTAL: 0.9 mg/dL (ref 0.3–1.2)
BUN: 10 mg/dL (ref 6–23)
CHLORIDE: 100 mmol/L (ref 96–112)
CO2: 28 mmol/L (ref 19–32)
Calcium: 9.3 mg/dL (ref 8.4–10.5)
Creatinine, Ser: 0.67 mg/dL (ref 0.50–1.10)
GFR calc Af Amer: >90 mL/min (ref >90)
GFR calc non Af Amer: 84 mL/min — ABNORMAL LOW (ref >90)
Glucose, Bld: 101 mg/dL — ABNORMAL HIGH (ref 70–99)
POTASSIUM: 4 mmol/L (ref 3.5–5.1)
SODIUM: 139 mmol/L (ref 135–145)
Total Protein: 7.5 g/dL (ref 6.0–8.3)

## 2014-11-15 LAB — PROTIME-INR
INR: 1.03 (ref 0.00–1.49)
PROTHROMBIN TIME: 13.6 s (ref 11.6–15.2)

## 2014-11-15 LAB — ABO/RH: ABO/RH(D): A POS

## 2014-11-15 LAB — SURGICAL PCR SCREEN
MRSA, PCR: NEGATIVE
Staphylococcus aureus: NEGATIVE

## 2014-11-15 LAB — APTT: aPTT: 34 seconds (ref 24–37)

## 2014-11-15 NOTE — Progress Notes (Signed)
Pt requests anxiety medicine for morning of surgery. Please address

## 2014-11-15 NOTE — Progress Notes (Signed)
Patient was seen preop by Dr. Hampton Abbot and felt to be stable for upcoming surgery.  [name], PA-C

## 2014-11-15 NOTE — Progress Notes (Addendum)
Spoke with dr Marcell Barlow and made aware 11-15-14 ekg results, pt needs cardiac clearance prior to 11-18-14 surgery, left message for wendy caton pt needs cardiac clearance per dr Marcell Barlow prior to 11-18-14 surgery, pt aware abnoram ekg, pt states feeling ok, does not need to go to emergency room at this time.

## 2014-11-15 NOTE — H&P (Signed)
TOTAL KNEE ADMISSION H&P  Patient is being admitted for right total knee arthroplasty.  Subjective:  Chief Complaint:right knee pain.  HPI: Bonnie Gray, 74 y.o. female, has a history of pain and functional disability in the right knee due to arthritis and has failed non-surgical conservative treatments for greater than 12 weeks to includeNSAID's and/or analgesics, corticosteriod injections, viscosupplementation injections and activity modification.  Onset of symptoms was gradual, starting 7 years ago with gradually worsening course since that time. The patient noted prior procedures on the knee to include  arthroscopy and menisectomy on the right knee(s).  Patient currently rates pain in the right knee(s) at 8 out of 10 with activity. Patient has night pain, worsening of pain with activity and weight bearing, pain that interferes with activities of daily living, pain with passive range of motion, crepitus and joint swelling.  Patient has evidence of periarticular osteophytes and joint space narrowing by imaging studies.  There is no active infection.  Patient Active Problem List   Diagnosis Date Noted  . Hypertension 01/14/2012  . Osteoarthritis 01/14/2012  . Change in bowel habits 01/14/2012  . Diarrhea 01/14/2012   Past Medical History  Diagnosis Date  . Hypertension   . Osteoarthritis     knees  . Diarrhea   . Dysphagia     Past Surgical History  Procedure Laterality Date  . Knee arthroscopy      2009  . Cholecystectomy    . Total abdominal hysterectomy      endometriosis  . Foot surgery      bunion and growth in arch. ( left foot)  . Appendectomy    . Colonoscopy    . Esophagogastroduodenoscopy    . Maloney dilation  01/16/2012    Procedure: MALONEY DILATION;  Surgeon: Rogene Houston, MD;  Location: [facility];  Service: Endoscopy;  Laterality: N/A;     Current outpatient prescriptions:  .  alum hydroxide-mag trisilicate (GAVISCON) 23-30 MG CHEW, Chew 2  tablets by mouth 2 (two) times daily as needed for indigestion. , Disp: , Rfl:  .  aspirin EC 81 MG tablet, Take 81 mg by mouth daily., Disp: , Rfl:  .  beta carotene w/minerals (OCUVITE) tablet, Take 1 tablet by mouth daily., Disp: , Rfl:  .  cholecalciferol (VITAMIN D) 1000 UNITS tablet, Take 1,000 Units by mouth daily as needed (if feeling sluggish)., Disp: , Rfl:  .  Cyanocobalamin (VITAMIN B 12 PO), Take 1 tablet by mouth daily as needed (if feeling sluggish)., Disp: , Rfl:  .  cyclobenzaprine (FLEXERIL) 5 MG tablet, Take 5 mg by mouth 2 (two) times daily as needed for muscle spasms., Disp: , Rfl:  .  fluticasone (FLONASE) 50 MCG/ACT nasal spray, Place 1 spray into both nostrils daily as needed for allergies or rhinitis., Disp: , Rfl:  .  guaiFENesin-codeine (ROBITUSSIN AC) 100-10 MG/5ML syrup, Take 15 mLs by mouth daily as needed for cough., Disp: , Rfl:  .  lisinopril-hydrochlorothiazide (PRINZIDE,ZESTORETIC) 20-12.5 MG per tablet, Take 2 tablets by mouth every morning., Disp: , Rfl:  .  loratadine (CLARITIN) 10 MG tablet, Take 10 mg by mouth daily., Disp: , Rfl:  .  meloxicam (MOBIC) 15 MG tablet, Take 15 mg by mouth daily., Disp: , Rfl:  .  Multiple Vitamin (MULTIVITAMIN WITH MINERALS) TABS tablet, Take 1 tablet by mouth daily., Disp: , Rfl:  .  Multiple Vitamins-Minerals (PRESERVISION AREDS) TABS, Take 1 tablet by mouth 2 (two) times daily. , Disp: , Rfl:  .  Polyethyl Glycol-Propyl Glycol (SYSTANE OP), Apply 1 drop to eye 2 (two) times daily as needed (Dry eyes)., Disp: , Rfl:     Allergies  Allergen Reactions  . Celebrex [Celecoxib]     hives  . Norvasc [Amlodipine Besylate]     Rash    History  Substance Use Topics  . Smoking status: Never Smoker   . Smokeless tobacco: No  . Alcohol Use: No    Family History  Problem Relation Age of Onset  . Colon cancer Neg Hx      Review of Systems  Constitutional: Negative.   HENT: Negative for congestion, ear discharge, ear pain,  hearing loss, nosebleeds, sore throat and tinnitus.   Respiratory: Positive for shortness of breath. Negative for cough, hemoptysis, sputum production, wheezing and stridor.        SOB with exertion  Gastrointestinal: Positive for diarrhea.  Genitourinary: Positive for frequency. Negative for dysuria, urgency, hematuria and flank pain.  Musculoskeletal: Positive for myalgias, back pain and joint pain. Negative for falls and neck pain.       Right knee pain  Skin: Positive for rash. Negative for itching.  Neurological: Positive for headaches. Negative for dizziness, tingling, tremors, sensory change, speech change, focal weakness, seizures and loss of consciousness.  Endo/Heme/Allergies: Positive for environmental allergies. Negative for polydipsia. Does not bruise/bleed easily.  Psychiatric/Behavioral: Negative.     Objective:  Physical Exam  Constitutional: She is oriented to person, place, and time. She appears well-developed. No distress.  Obese  HENT:  Head: Normocephalic and atraumatic.  Right Ear: External ear normal.  Left Ear: External ear normal.  Nose: Nose normal.  Mouth/Throat: Oropharynx is clear and moist.  Eyes: Conjunctivae and EOM are normal.  Neck: Normal range of motion. Neck supple.  Cardiovascular: Normal rate, regular rhythm, normal heart sounds and intact distal pulses.   No murmur heard. Respiratory: Effort normal and breath sounds normal. No respiratory distress. She has no wheezes.  GI: Soft. Bowel sounds are normal. She exhibits no distension. There is no tenderness.  Musculoskeletal:       Right hip: Normal.       Left hip: Normal.       Right knee: She exhibits decreased range of motion and swelling. She exhibits no effusion and no erythema. Tenderness found. Medial joint line and lateral joint line tenderness noted.       Left knee: She exhibits decreased range of motion and swelling. She exhibits no effusion and no erythema. Tenderness found. Medial  joint line and lateral joint line tenderness noted.       Right lower leg: She exhibits no tenderness and no swelling.       Left lower leg: She exhibits no tenderness and no swelling.  Evaluation of her hips show normal range of motion with no discomfort. The right knee shows no effusion. Her range of motion on the right is about 5 to 120. There is marked crepitus on range of motion. She is tender lateral greater than medial with no instability noted. Her left knee shows no effusion, with range about 5 to 125 with marked crepitus on range of motion, tender medial greater than lateral with no instability.  Neurological: She is alert and oriented to person, place, and time. She has normal strength and normal reflexes. No sensory deficit.  Skin: No rash noted. She is not diaphoretic. No erythema.  Psychiatric: She has a normal mood and affect. Her behavior is normal.    Vitals  Weight: 203 lb Height: 64.5in Body Surface Area: 1.98 m Body Mass Index: 34.31 kg/m  Pulse: 80 (Regular)  BP: 158/84 (Sitting, Left Arm, Standard)   Imaging Review Plain radiographs demonstrate severe degenerative joint disease of the right knee(s). The overall alignment ismild valgus. The bone quality appears to be good for age and reported activity level.  Assessment/Plan:  End stage primary osteoarthritis, right knee   The patient history, physical examination, clinical judgment of the provider and imaging studies are consistent with end stage degenerative joint disease of the right knee(s) and total knee arthroplasty is deemed medically necessary. The treatment options including medical management, injection therapy arthroscopy and arthroplasty were discussed at length. The risks and benefits of total knee arthroplasty were presented and reviewed. The risks due to aseptic loosening, infection, stiffness, patella tracking problems, thromboembolic complications and other imponderables were discussed. The  patient acknowledged the explanation, agreed to proceed with the plan and consent was signed. Patient is being admitted for inpatient treatment for surgery, pain control, PT, OT, prophylactic antibiotics, VTE prophylaxis, progressive ambulation and ADL's and discharge planning. The patient is planning to be discharged home with home health services   TXA IV PCP: Dr. Johnnette Gourd, PA-C

## 2014-11-16 ENCOUNTER — Telehealth: Payer: Self-pay | Admitting: Cardiovascular Disease

## 2014-11-16 NOTE — Telephone Encounter (Signed)
Received records from Dr Hector Shade for appointment with Dr Gwenlyn Found on 11/17/14.  Records given to [facility] (medical records) for Dr Kennon Holter schedule on 11/17/14.  lp

## 2014-11-17 ENCOUNTER — Ambulatory Visit (INDEPENDENT_AMBULATORY_CARE_PROVIDER_SITE_OTHER): Payer: Medicare Other | Admitting: Cardiovascular Disease

## 2014-11-17 ENCOUNTER — Encounter (HOSPITAL_COMMUNITY): Payer: Self-pay | Admitting: *Deleted

## 2014-11-17 ENCOUNTER — Encounter: Payer: Self-pay | Admitting: Cardiovascular Disease

## 2014-11-17 VITALS — BP 178/100 | HR 95 | Ht 64.5 in | Wt 203.5 lb

## 2014-11-17 DIAGNOSIS — I4510 Unspecified right bundle-branch block: Secondary | ICD-10-CM | POA: Insufficient documentation

## 2014-11-17 DIAGNOSIS — R0600 Dyspnea, unspecified: Secondary | ICD-10-CM | POA: Insufficient documentation

## 2014-11-17 DIAGNOSIS — Z01818 Encounter for other preprocedural examination: Secondary | ICD-10-CM

## 2014-11-17 DIAGNOSIS — R0609 Other forms of dyspnea: Secondary | ICD-10-CM

## 2014-11-17 DIAGNOSIS — I10 Essential (primary) hypertension: Secondary | ICD-10-CM

## 2014-11-17 NOTE — Progress Notes (Signed)
11/17/2014 Bonnie Gray   12/21/1940  [id number]  Primary Physician Bonnie Palms, MD Primary Cardiologist: Bonnie Harp MD Bonnie Gray   HPI:  Bonnie Gray is a 74 year old moderately overweight divorced Caucasian female mother of 2 children, grandmother of 2 grandchildren who is accompanied by her daughter Bonnie Gray today. She was referred by Bonnie Gray office for preoperative clearance for elective right total knee replacement because of dyspnea and right bundle-branch block. Her cardiovascular risk factor profile is notable for hypertension and family history with his father who had a myocardial infarction at age 25. She has never had a heart attack or stroke and denies chest pain but does get dyspneic on exertion.   Current Outpatient Prescriptions  Medication Sig Dispense Refill  . alum hydroxide-mag trisilicate (GAVISCON) 41-28 MG CHEW Chew 2 tablets by mouth 2 (two) times daily as needed for indigestion.     Marland Kitchen aspirin EC 81 MG tablet Take 81 mg by mouth daily.    . beta carotene w/minerals (OCUVITE) tablet Take 1 tablet by mouth daily.    . cholecalciferol (VITAMIN D) 1000 UNITS tablet Take 1,000 Units by mouth daily as needed (if feeling sluggish).    . Cyanocobalamin (VITAMIN B 12 PO) Take 1 tablet by mouth daily as needed (if feeling sluggish).    . fluticasone (FLONASE) 50 MCG/ACT nasal spray Place 1 spray into both nostrils daily as needed for allergies or rhinitis.    Marland Kitchen guaiFENesin-codeine (ROBITUSSIN AC) 100-10 MG/5ML syrup Take 15 mLs by mouth daily as needed for cough.    Marland Kitchen lisinopril-hydrochlorothiazide (PRINZIDE,ZESTORETIC) 20-12.5 MG per tablet Take 2 tablets by mouth every morning.    . loratadine (CLARITIN) 10 MG tablet Take 10 mg by mouth daily.    . meloxicam (MOBIC) 15 MG tablet Take 15 mg by mouth daily.    . Multiple Vitamin (MULTIVITAMIN WITH MINERALS) TABS tablet Take 1 tablet by mouth daily.    . Multiple Vitamins-Minerals  (PRESERVISION AREDS) TABS Take 1 tablet by mouth 2 (two) times daily.     Bonnie Gray (SYSTANE OP) Apply 1 drop to eye 2 (two) times daily as needed (Dry eyes).    Marland Kitchen dicyclomine (BENTYL) 10 MG capsule Take 1 capsule (10 mg total) by mouth 4 (four) times daily -  before meals and at bedtime. (Patient not taking: Reported on 11/09/2014) 30 capsule 2   No current facility-administered medications for this visit.    Allergies  Allergen Reactions  . Celebrex [Celecoxib]     hives  . Norvasc [Amlodipine Besylate]     Rash    History   Social History  . Marital Status: Divorced    Spouse Name: N/A  . Number of Children: N/A  . Years of Education: N/A   Occupational History  . Not on file.   Social History Main Topics  . Smoking status: Never Smoker   . Smokeless tobacco: Never Used  . Alcohol Use: Yes     Comment: rare  . Drug Use: No  . Sexual Activity: Not on file   Other Topics Concern  . Not on file   Social History Narrative     Review of Systems: General: negative for chills, fever, night sweats or weight changes.  Cardiovascular: negative for chest pain, dyspnea on exertion, edema, orthopnea, palpitations, paroxysmal nocturnal dyspnea or shortness of breath Dermatological: negative for rash Respiratory: negative for cough or wheezing Urologic: negative for hematuria Abdominal: negative for nausea, vomiting, diarrhea, bright  red blood per rectum, melena, or hematemesis Neurologic: negative for visual changes, syncope, or dizziness All other systems reviewed and are otherwise negative except as noted above.    Blood pressure 178/100, pulse 95, height 5' 4.5" (1.638 m), weight 203 lb 8 oz (92.307 kg).  General appearance: alert and no distress Neck: no adenopathy, no carotid bruit, no JVD, supple, symmetrical, trachea midline and thyroid not enlarged, symmetric, no tenderness/mass/nodules Lungs: clear to auscultation bilaterally Heart: regular  rate and rhythm, S1, S2 normal, no murmur, click, rub or gallop Extremities: extremities normal, atraumatic, no cyanosis or edema and 2+ pedal pulses bilaterally  EKG normal sinus rhythm at 95 with right bundle branch block left axis deviation with left ventricular hypertrophy. I personally reviewed this EKG  ASSESSMENT AND PLAN:   Hypertension History of hypertension with blood pressure measured today at 178/100 though at home this, this morning, her blood pressure measured 140/82. She is on lisinopril hydrochlorothiazide 20/12.5 twice a day. Continue current meds at current dose   Dyspnea on exertion History of dyspnea on exertion though she presents to her weight. She does have hypertension and family history. I'm going to get a 2-D echo for LV function as well as a pharmacologic Myoview stress test to rule out an ischemic etiology       Bonnie Harp MD [facility], [facility] 11/17/2014 2:16 PM

## 2014-11-17 NOTE — Patient Instructions (Signed)
Your physician has requested that you have a lexiscan myoview. For further information please visit [email or web address]. Please follow instruction sheet, as given.   Your physician has requested that you have an echocardiogram. Echocardiography is a painless test that uses sound waves to create images of your heart. It provides your doctor with information about the size and shape of your heart and how well your heart's chambers and valves are working. This procedure takes approximately one hour. There are no restrictions for this procedure.   Your physician recommends that you schedule a follow-up appointment as needed. Sooner if tests are abnormal.

## 2014-11-17 NOTE — Assessment & Plan Note (Signed)
History of hypertension with blood pressure measured today at 178/100 though at home this, this morning, her blood pressure measured 140/82. She is on lisinopril hydrochlorothiazide 20/12.5 twice a day. Continue current meds at current dose

## 2014-11-17 NOTE — Assessment & Plan Note (Signed)
History of dyspnea on exertion though she presents to her weight. She does have hypertension and family history. I'm going to get a 2-D echo for LV function as well as a pharmacologic Myoview stress test to rule out an ischemic etiology

## 2014-11-18 ENCOUNTER — Encounter (HOSPITAL_COMMUNITY): Admission: RE | Payer: Self-pay | Source: Ambulatory Visit

## 2014-11-18 ENCOUNTER — Inpatient Hospital Stay (HOSPITAL_COMMUNITY): Admission: RE | Admit: 2014-11-18 | Payer: Medicare Other | Source: Ambulatory Visit | Admitting: Orthopedic Surgery

## 2014-11-18 LAB — TYPE AND SCREEN
ABO/RH(D): A POS
Antibody Screen: NEGATIVE

## 2014-11-18 SURGERY — ARTHROPLASTY, KNEE, TOTAL
Anesthesia: Choice | Site: Knee | Laterality: Right

## 2014-11-22 ENCOUNTER — Telehealth (HOSPITAL_COMMUNITY): Payer: Self-pay

## 2014-11-22 NOTE — Telephone Encounter (Signed)
Encounter complete. 

## 2014-11-23 ENCOUNTER — Telehealth (HOSPITAL_COMMUNITY): Payer: Self-pay

## 2014-11-23 NOTE — Telephone Encounter (Signed)
Encounter complete. 

## 2014-11-24 ENCOUNTER — Ambulatory Visit (HOSPITAL_COMMUNITY)
Admission: RE | Admit: 2014-11-24 | Discharge: 2014-11-24 | Disposition: A | Discharge status: Home / Self Care | Payer: Medicare Other | Source: Ambulatory Visit | Attending: Cardiology | Admitting: Cardiology

## 2014-11-24 DIAGNOSIS — Z8249 Family history of ischemic heart disease and other diseases of the circulatory system: Secondary | ICD-10-CM | POA: Insufficient documentation

## 2014-11-24 DIAGNOSIS — R002 Palpitations: Secondary | ICD-10-CM | POA: Insufficient documentation

## 2014-11-24 DIAGNOSIS — R0609 Other forms of dyspnea: Secondary | ICD-10-CM | POA: Diagnosis not present

## 2014-11-24 DIAGNOSIS — R42 Dizziness and giddiness: Secondary | ICD-10-CM | POA: Insufficient documentation

## 2014-11-24 DIAGNOSIS — I4510 Unspecified right bundle-branch block: Secondary | ICD-10-CM | POA: Insufficient documentation

## 2014-11-24 DIAGNOSIS — E669 Obesity, unspecified: Secondary | ICD-10-CM | POA: Diagnosis not present

## 2014-11-24 DIAGNOSIS — I10 Essential (primary) hypertension: Secondary | ICD-10-CM

## 2014-11-24 DIAGNOSIS — R079 Chest pain, unspecified: Secondary | ICD-10-CM | POA: Diagnosis not present

## 2014-11-24 DIAGNOSIS — R5383 Other fatigue: Secondary | ICD-10-CM | POA: Insufficient documentation

## 2014-11-24 DIAGNOSIS — Z01818 Encounter for other preprocedural examination: Secondary | ICD-10-CM | POA: Insufficient documentation

## 2014-11-24 DIAGNOSIS — R0600 Dyspnea, unspecified: Secondary | ICD-10-CM

## 2014-11-24 MED ORDER — TECHNETIUM TC 99M SESTAMIBI GENERIC - CARDIOLITE
10.4000 | Freq: Once | INTRAVENOUS | Status: AC | PRN
  Administered 2014-11-24: 10 via INTRAVENOUS

## 2014-11-24 MED ORDER — REGADENOSON 0.4 MG/5ML IV SOLN
0.4000 mg | Freq: Once | INTRAVENOUS | Status: AC
  Administered 2014-11-24: 0.4 mg via INTRAVENOUS

## 2014-11-24 MED ORDER — TECHNETIUM TC 99M SESTAMIBI GENERIC - CARDIOLITE
30.5000 | Freq: Once | INTRAVENOUS | Status: AC | PRN
  Administered 2014-11-24: 31 via INTRAVENOUS

## 2014-11-24 NOTE — Progress Notes (Signed)
2D Echocardiogram Complete.  11/24/2014   Akashdeep Chuba Frazer, RDCS

## 2014-11-24 NOTE — Procedures (Addendum)
[facility] [address] [place] [address] [phone]  Cardiology Nuclear Med Study  DENNISSE SWADER is a 74 y.o. female     MRN : [id number]     DOB: [birth year]  Procedure Date: 11/24/2014  Nuclear Med Background Indication for Stress Test:  Surgical Clearance History:  No prior cardiac or respiratory history reported;No prior NUC MPI for comparison. Cardiac Risk Factors: Family History - CAD, Hypertension, Obesity and RBBB  Symptoms:  Chest Pain, Dizziness, DOE, Fatigue, Light-Headedness and Palpitations   Nuclear Pre-Procedure Caffeine/Decaff Intake:  7:00pm NPO After: 5:00am   IV Site: R Forearm  IV 0.9% NS with Angio Cath:  20g  Chest Size (in):  n/a IV Started by: Rolene Course, RN  Height: 5\' 4"  (1.626 m)  Cup Size: D  BMI:  Body mass index is 34.83 kg/(m^2). Weight:  203 lb (92.08 kg)   Tech Comments:  n/a    Nuclear Med Study 1 or 2 day study: 1 day  Stress Test Type:  [place] Provider:  Quay Burow, MD   Resting Radionuclide: Technetium 38m Sestamibi  Resting Radionuclide Dose: 10.4 mCi   Stress Radionuclide:  Technetium 38m Sestamibi  Stress Radionuclide Dose: 30.5 mCi           Stress Protocol Rest HR: 77 Stress HR: 90  Rest BP: 182/87 Stress BP: 185/85  Exercise Time (min): n/a METS: n/a   Predicted Max HR: 145 bpm % Max HR: 62.07 bpm Rate Pressure Product: 16830  Dose of Adenosine (mg):  n/a Dose of Lexiscan: 0.4 mg  Dose of Atropine (mg): n/a Dose of Dobutamine: n/a mcg/kg/min (at max HR)  Stress Test Technologist: Leane Para, CCT Nuclear Technologist: Imagene Riches, CNMT   Rest Procedure:  Myocardial perfusion imaging was performed at rest 45 minutes following the intravenous administration of Technetium 30m Sestamibi. Stress Procedure:  The patient received IV Lexiscan 0.4 mg over 15-seconds.  Technetium 44m Sestamibi injected IV at 30-seconds.  There were  no significant changes with Lexiscan.  Quantitative spect images were obtained after a 45 minute delay.  Transient Ischemic Dilatation (Normal <1.22):  1.12  QGS EDV:  62 ml QGS ESV:  19 ml LV Ejection Fraction: 69%  Rest ECG: NSR-RBBB  Stress ECG: No significant change from baseline ECG  QPS Raw Data Images:  Normal; no motion artifact; normal heart/lung ratio. Stress Images:  There is decreased uptake in the apex. Rest Images:  Normal homogeneous uptake in all areas of the myocardium. Subtraction (SDS):  5  Impression Exercise Capacity:  Lexiscan with no exercise. BP Response:  Normal blood pressure response. Clinical Symptoms:  There is dyspnea. ECG Impression:  No significant ECG changes with Lexiscan. Comparison with Prior Nuclear Study: No previous nuclear study performed  Overall Impression:  Intermediate risk stress nuclear study with small area of mild, apical reversibility. Suspect shifting breast artifact, however distal LAD ischemia cannot be excluded..  LV Wall Motion:  NL LV Function; NL Wall Motion; LVEF 69%  Pixie Casino, MD, [facility] Board Certified in Nuclear Cardiology Attending Cardiologist [place], MD  11/24/2014 12:03 PM

## 2014-11-30 ENCOUNTER — Telehealth: Payer: Self-pay | Admitting: Cardiovascular Disease

## 2014-11-30 NOTE — Telephone Encounter (Signed)
Will contact patient with study results once the study has been reviewed by Dr. Gwenlyn Found.

## 2014-11-30 NOTE — Telephone Encounter (Signed)
Pt says she is anxious to get her echo and stress test results from 11-24-14 please.

## 2014-11-30 NOTE — Telephone Encounter (Signed)
Results not read, reported this to patient, she voiced understanding.

## 2014-12-12 ENCOUNTER — Encounter: Payer: Self-pay | Admitting: *Deleted

## 2014-12-12 ENCOUNTER — Telehealth: Payer: Self-pay | Admitting: Cardiovascular Disease

## 2014-12-12 NOTE — Telephone Encounter (Signed)
Low risk Myoview. The patient's cleared for upcoming total hip replacement low risk

## 2014-12-12 NOTE — Telephone Encounter (Signed)
Patient notified of results. Results routed to PCP via EPIC  Patient needs clearance letter sent to Dr. Maureen Ralphs  Will defer to Dr. Gwenlyn Found [place], RN to advise

## 2014-12-12 NOTE — Telephone Encounter (Signed)
Mrs. Calkin is calling to get her results of her Echo and Myoview . Please call .   Thanks

## 2014-12-13 NOTE — Telephone Encounter (Signed)
Hand delivered surgical clearance letter to [facility].   Patient aware she has been cleared

## 2014-12-22 ENCOUNTER — Ambulatory Visit: Payer: Self-pay | Admitting: Orthopedic Surgery

## 2014-12-22 NOTE — Progress Notes (Signed)
Preoperative surgical orders have been place into the Epic hospital system for Bonnie Gray on 12/22/2014, 1:54 PM  by Mickel Crow for surgery on 01-09-15.  Preop Total Knee orders including Experal, IV Tylenol, and IV Decadron as long as there are no contraindications to the above medications. [name], PA-C

## 2014-12-29 ENCOUNTER — Other Ambulatory Visit (HOSPITAL_COMMUNITY): Payer: Self-pay | Admitting: *Deleted

## 2014-12-29 NOTE — Patient Instructions (Addendum)
Bonnie Gray  12/29/2014   Your procedure is scheduled on: Monday April 4th, 2016  Report to [facility] Main  Entrance and follow signs to               [place] at 730 AM.  Call this number if you have problems the morning of surgery [phone]   Remember:  Do not eat food or drink liquids :After Midnight. Sunday NIGHT     Take these medicines the morning of surgery with A SIP OF WATER: Loratadine May take Diazepam if needed                               You may not have any metal on your body including hair pins and              piercings  Do not wear jewelry, make-up, lotions, powders or perfumes.             Do not wear nail polish.  Do not shave  48 hours prior to surgery.              Men may shave face and neck.   Do not bring valuables to the hospital. [place].  Contacts, dentures or bridgework may not be worn into surgery.  Leave suitcase in the car. After surgery it may be brought to your room.     Patients discharged the day of surgery will not be allowed to drive home.    Special Instructions: N/A              Please read over the following fact sheets you were given: _____________________________________________________________________             [facility] - Preparing for Surgery Before surgery, you can play an important role.  Because skin is not sterile, your skin needs to be as free of germs as possible.  You can reduce the number of germs on your skin by washing with CHG (chlorahexidine gluconate) soap before surgery.  CHG is an antiseptic cleaner which kills germs and bonds with the skin to continue killing germs even after washing. Please DO NOT use if you have an allergy to CHG or antibacterial soaps.  If your skin becomes reddened/irritated stop using the CHG and inform your nurse when you arrive at Short Stay. Do not shave (including legs and underarms) for at  least 48 hours prior to the first CHG shower.  You may shave your face/neck. Please follow these instructions carefully:  1.  Shower with CHG Soap the night before surgery and the  morning of Surgery.  2.  If you choose to wash your hair, wash your hair first as usual with your  normal  shampoo.  3.  After you shampoo, rinse your hair and body thoroughly to remove the  shampoo.                           4.  Use CHG as you would any other liquid soap.  You can apply chg directly  to the skin and wash  Gently with a scrungie or clean washcloth.  5.  Apply the CHG Soap to your body ONLY FROM THE NECK DOWN.   Do not use on face/ open                           Wound or open sores. Avoid contact with eyes, ears mouth and genitals (private parts).                       Wash face,  Genitals (private parts) with your normal soap.             6.  Wash thoroughly, paying special attention to the area where your surgery  will be performed.  7.  Thoroughly rinse your body with warm water from the neck down.  8.  DO NOT shower/wash with your normal soap after using and rinsing off  the CHG Soap.                9.  Pat yourself dry with a clean towel.            10.  Wear clean pajamas.            11.  Place clean sheets on your bed the night of your first shower and do not  sleep with pets. Day of Surgery : Do not apply any lotions/deodorants the morning of surgery.  Please wear clean clothes to the hospital/surgery center.  FAILURE TO FOLLOW THESE INSTRUCTIONS MAY RESULT IN THE CANCELLATION OF YOUR SURGERY PATIENT SIGNATURE_________________________________  NURSE SIGNATURE__________________________________  ________________________________________________________________________   [name]  An incentive spirometer is a tool that can help keep your lungs clear and active. This tool measures how well you are filling your lungs with each breath. Taking long deep breaths  may help reverse or decrease the chance of developing breathing (pulmonary) problems (especially infection) following:  A long period of time when you are unable to move or be active. BEFORE THE PROCEDURE   If the spirometer includes an indicator to show your best effort, your nurse or respiratory therapist will set it to a desired goal.  If possible, sit up straight or lean slightly forward. Try not to slouch.  Hold the incentive spirometer in an upright position. INSTRUCTIONS FOR USE   Sit on the edge of your bed if possible, or sit up as far as you can in bed or on a chair.  Hold the incentive spirometer in an upright position.  Breathe out normally.  Place the mouthpiece in your mouth and seal your lips tightly around it.  Breathe in slowly and as deeply as possible, raising the piston or the ball toward the top of the column.  Hold your breath for 3-5 seconds or for as long as possible. Allow the piston or ball to fall to the bottom of the column.  Remove the mouthpiece from your mouth and breathe out normally.  Rest for a few seconds and repeat Steps 1 through 7 at least 10 times every 1-2 hours when you are awake. Take your time and take a few normal breaths between deep breaths.  The spirometer may include an indicator to show your best effort. Use the indicator as a goal to work toward during each repetition.  After each set of 10 deep breaths, practice coughing to be sure your lungs are clear. If you have an incision (the cut made at the time of surgery),  support your incision when coughing by placing a pillow or rolled up towels firmly against it. Once you are able to get out of bed, walk around indoors and cough well. You may stop using the incentive spirometer when instructed by your caregiver.  RISKS AND COMPLICATIONS  Take your time so you do not get dizzy or light-headed.  If you are in pain, you may need to take or ask for pain medication before doing incentive  spirometry. It is harder to take a deep breath if you are having pain. AFTER USE  Rest and breathe slowly and easily.  It can be helpful to keep track of a log of your progress. Your caregiver can provide you with a simple table to help with this. If you are using the spirometer at home, follow these instructions: [place] IF:   You are having difficultly using the spirometer.  You have trouble using the spirometer as often as instructed.  Your pain medication is not giving enough relief while using the spirometer.  You develop fever of 100.5 F (38.1 C) or higher. SEEK IMMEDIATE MEDICAL CARE IF:   You cough up bloody sputum that had not been present before.  You develop fever of 102 F (38.9 C) or greater.  You develop worsening pain at or near the incision site. MAKE SURE YOU:   Understand these instructions.  Will watch your condition.  Will get help right away if you are not doing well or get worse. Document Released: 02/03/2007 Document Revised: 12/16/2011 Document Reviewed: 04/06/2007 Exit[employer] Patient Information 2014 [employer], [place].   ________________________________________________________________________  WHAT IS A BLOOD TRANSFUSION? Blood Transfusion Information  A transfusion is the replacement of blood or some of its parts. Blood is made up of multiple cells which provide different functions.  Red blood cells carry oxygen and are used for blood loss replacement.  White blood cells fight against infection.  Platelets control bleeding.  Plasma helps clot blood.  Other blood products are available for specialized needs, such as hemophilia or other clotting disorders. BEFORE THE TRANSFUSION  Who gives blood for transfusions?   Healthy volunteers who are fully evaluated to make sure their blood is safe. This is blood bank blood. Transfusion therapy is the safest it has ever been in the practice of medicine. Before blood is taken from a donor, a  complete history is taken to make sure that person has no history of diseases nor engages in risky social behavior (examples are intravenous drug use or sexual activity with multiple partners). The donor's travel history is screened to minimize risk of transmitting infections, such as malaria. The donated blood is tested for signs of infectious diseases, such as HIV and hepatitis. The blood is then tested to be sure it is compatible with you in order to minimize the chance of a transfusion reaction. If you or a relative donates blood, this is often done in anticipation of surgery and is not appropriate for emergency situations. It takes many days to process the donated blood. RISKS AND COMPLICATIONS Although transfusion therapy is very safe and saves many lives, the main dangers of transfusion include:   Getting an infectious disease.  Developing a transfusion reaction. This is an allergic reaction to something in the blood you were given. Every precaution is taken to prevent this. The decision to have a blood transfusion has been considered carefully by your caregiver before blood is given. Blood is not given unless the benefits outweigh the risks. AFTER THE TRANSFUSION  Right after receiving a blood transfusion, you will usually feel much better and more energetic. This is especially true if your red blood cells have gotten low (anemic). The transfusion raises the level of the red blood cells which carry oxygen, and this usually causes an energy increase.  The nurse administering the transfusion will monitor you carefully for complications. HOME CARE INSTRUCTIONS  No special instructions are needed after a transfusion. You may find your energy is better. Speak with your caregiver about any limitations on activity for underlying diseases you may have. SEEK MEDICAL CARE IF:   Your condition is not improving after your transfusion.  You develop redness or irritation at the intravenous (IV)  site. SEEK IMMEDIATE MEDICAL CARE IF:  Any of the following symptoms occur over the next 12 hours:  Shaking chills.  You have a temperature by mouth above 102 F (38.9 C), not controlled by medicine.  Chest, back, or muscle pain.  People around you feel you are not acting correctly or are confused.  Shortness of breath or difficulty breathing.  Dizziness and fainting.  You get a rash or develop hives.  You have a decrease in urine output.  Your urine turns a dark color or changes to pink, red, or brown. Any of the following symptoms occur over the next 10 days:  You have a temperature by mouth above 102 F (38.9 C), not controlled by medicine.  Shortness of breath.  Weakness after normal activity.  The white part of the eye turns yellow (jaundice).  You have a decrease in the amount of urine or are urinating less often.  Your urine turns a dark color or changes to pink, red, or brown. Document Released: 09/20/2000 Document Revised: 12/16/2011 Document Reviewed: 05/09/2008 [facility] Patient Information 2014 [place], [place].  _______________________________________________________________________

## 2014-12-29 NOTE — Progress Notes (Signed)
Echo 11-24-14 epic Myocardial perfusion 11-24-14 epic Cardiac clearance note dr berry on chart for 01-10-15 surgery Chest xray 11-15-14 epic ekg 11-17-14 epic

## 2015-01-02 ENCOUNTER — Encounter (HOSPITAL_COMMUNITY): Payer: Self-pay

## 2015-01-02 ENCOUNTER — Encounter (HOSPITAL_COMMUNITY)
Admission: RE | Admit: 2015-01-02 | Discharge: 2015-01-02 | Disposition: A | Discharge status: Home / Self Care | Payer: Medicare Other | Source: Ambulatory Visit | Attending: Orthopedic Surgery | Admitting: Orthopedic Surgery

## 2015-01-02 DIAGNOSIS — M1711 Unilateral primary osteoarthritis, right knee: Secondary | ICD-10-CM | POA: Insufficient documentation

## 2015-01-02 DIAGNOSIS — Z01812 Encounter for preprocedural laboratory examination: Secondary | ICD-10-CM | POA: Diagnosis not present

## 2015-01-02 HISTORY — DX: Personal history of other medical treatment: Z92.89

## 2015-01-02 LAB — CBC
HCT: 41.3 % (ref 36.0–46.0)
Hemoglobin: 13.7 g/dL (ref 12.0–15.0)
MCH: 29.5 pg (ref 26.0–34.0)
MCHC: 33.2 g/dL (ref 30.0–36.0)
MCV: 88.8 fL (ref 78.0–100.0)
PLATELETS: 239 10*3/uL (ref 150–400)
RBC: 4.65 MIL/uL (ref 3.87–5.11)
RDW: 13.2 % (ref 11.5–15.5)
WBC: 8.5 10*3/uL (ref 4.0–10.5)

## 2015-01-02 LAB — URINALYSIS, ROUTINE W REFLEX MICROSCOPIC
Bilirubin Urine: NEGATIVE
Glucose, UA: NEGATIVE mg/dL
Hgb urine dipstick: NEGATIVE
Ketones, ur: NEGATIVE mg/dL
LEUKOCYTES UA: NEGATIVE
Nitrite: NEGATIVE
PH: 7.5 (ref 5.0–8.0)
Protein, ur: NEGATIVE mg/dL
SPECIFIC GRAVITY, URINE: 1.007 (ref 1.005–1.030)
UROBILINOGEN UA: 0.2 mg/dL (ref 0.0–1.0)

## 2015-01-02 LAB — COMPREHENSIVE METABOLIC PANEL
ALT: 17 U/L (ref 0–35)
ANION GAP: 11 (ref 5–15)
AST: 18 U/L (ref 0–37)
Albumin: 4.5 g/dL (ref 3.5–5.2)
Alkaline Phosphatase: 52 U/L (ref 39–117)
BILIRUBIN TOTAL: 0.8 mg/dL (ref 0.3–1.2)
BUN: 10 mg/dL (ref 6–23)
CHLORIDE: 98 mmol/L (ref 96–112)
CO2: 29 mmol/L (ref 19–32)
Calcium: 9.7 mg/dL (ref 8.4–10.5)
Creatinine, Ser: 0.63 mg/dL (ref 0.50–1.10)
GFR calc non Af Amer: 86 mL/min — ABNORMAL LOW (ref >90)
GLUCOSE: 104 mg/dL — AB (ref 70–99)
POTASSIUM: 3.9 mmol/L (ref 3.5–5.1)
SODIUM: 138 mmol/L (ref 135–145)
Total Protein: 7.5 g/dL (ref 6.0–8.3)

## 2015-01-02 LAB — PROTIME-INR
INR: 0.96 (ref 0.00–1.49)
Prothrombin Time: 12.9 seconds (ref 11.6–15.2)

## 2015-01-02 LAB — SURGICAL PCR SCREEN
MRSA, PCR: NEGATIVE
Staphylococcus aureus: NEGATIVE

## 2015-01-02 LAB — APTT: aPTT: 30 seconds (ref 24–37)

## 2015-01-06 NOTE — H&P (Signed)
TOTAL KNEE ADMISSION H&P  Patient is being admitted for right total knee arthroplasty.  Subjective:  Chief Complaint:right knee pain.  HPI: Bonnie Gray, 74 y.o. female, has a history of pain and functional disability in the right knee due to arthritis and has failed non-surgical conservative treatments for greater than 12 weeks to includeNSAID's and/or analgesics, corticosteriod injections, viscosupplementation injections, use of assistive devices and activity modification.  Onset of symptoms was gradual, starting 5 years ago with gradually worsening course since that time. The patient noted prior procedures on the knee to include  arthroscopy and menisectomy on the right knee(s).  Patient currently rates pain in the right knee(s) at 8 out of 10 with activity. Patient has night pain, worsening of pain with activity and weight bearing, pain that interferes with activities of daily living, pain with passive range of motion, crepitus and joint swelling.  Patient has evidence of periarticular osteophytes and joint space narrowing by imaging studies.  There is no active infection.  Patient Active Problem List   Diagnosis Date Noted  . Dyspnea on exertion 11/17/2014  . Right bundle branch block 11/17/2014  . Hypertension 01/14/2012  . Osteoarthritis 01/14/2012  . Change in bowel habits 01/14/2012  . Diarrhea 01/14/2012   Past Medical History  Diagnosis Date  . Hypertension   . Osteoarthritis     knees  . Diarrhea   . Diverticulosis   . Anxiety   . Anemia     as a child  . Cataract     right  . Right bundle branch block   . History of blood transfusion   . Shortness of breath dyspnea     "because of the weight"/01/02/15- states unchanged    Past Surgical History  Procedure Laterality Date  . Knee arthroscopy Right     2009  . Foot surgery      bunion and growth in arch. ( left foot)  . Colonoscopy  4 years ago  . Esophagogastroduodenoscopy  4 years ago  . Maloney dilation   01/16/2012    Procedure: MALONEY DILATION;  Surgeon: Rogene Houston, MD;  Location: [facility];  Service: Endoscopy;  Laterality: N/A;  . Total abdominal hysterectomy  1983    endometriosis  . Appendectomy  1956  . Cholecystectomy  1995  . Nasal sinus surgery  1979  . Cataract extraction Left 2010  . Eye surgery Left     cataract extraction with IOL     Current outpatient prescriptions:  .  alum hydroxide-mag trisilicate (GAVISCON) 96-28 MG CHEW, Chew 2 tablets by mouth 2 (two) times daily as needed for indigestion. , Disp: , Rfl:  .  aspirin EC 81 MG tablet, Take 81 mg by mouth every morning. , Disp: , Rfl:  .  beta carotene w/minerals (OCUVITE) tablet, Take 1 tablet by mouth every morning. , Disp: , Rfl:  .  cholecalciferol (VITAMIN D) 1000 UNITS tablet, Take 1,000 Units by mouth daily as needed (if feeling sluggish)., Disp: , Rfl:  .  Cyanocobalamin (VITAMIN B 12 PO), Take 1 tablet by mouth daily as needed (if feeling sluggish)., Disp: , Rfl:  .  diazepam (VALIUM) 10 MG tablet, Take 5-10 mg by mouth 2 (two) times daily as needed for anxiety (mucsle spasm.)., Disp: , Rfl:  .  DimenhyDRINATE (DRAMAMINE PO), Take 0.5-1 tablets by mouth at bedtime as needed (sleep.)., Disp: , Rfl:  .  fluticasone (FLONASE) 50 MCG/ACT nasal spray, Place 1 spray into both nostrils daily as  needed for allergies or rhinitis., Disp: , Rfl:  .  guaiFENesin-codeine (ROBITUSSIN AC) 100-10 MG/5ML syrup, Take 15 mLs by mouth 2 (two) times daily as needed for cough or congestion. , Disp: , Rfl:  .  lisinopril-hydrochlorothiazide (PRINZIDE,ZESTORETIC) 20-12.5 MG per tablet, Take 2 tablets by mouth every morning., Disp: , Rfl:  .  loratadine (CLARITIN) 10 MG tablet, Take 10 mg by mouth every morning. , Disp: , Rfl:  .  meloxicam (MOBIC) 15 MG tablet, Take 15 mg by mouth every morning. , Disp: , Rfl:  .  Multiple Vitamin (MULTIVITAMIN WITH MINERALS) TABS tablet, Take 1 tablet by mouth daily at 12 noon. Centrum Silver.,  Disp: , Rfl:  .  Multiple Vitamins-Minerals (PRESERVISION AREDS) TABS, Take 1 tablet by mouth 2 (two) times daily. , Disp: , Rfl:  .  Polyethyl Glycol-Propyl Glycol (SYSTANE OP), Apply 1 drop to eye 2 (two) times daily as needed (Dry eyes)., Disp: , Rfl:    Allergies  Allergen Reactions  . Celebrex [Celecoxib]     hives  . Norvasc [Amlodipine Besylate]     Rash  . Flexeril [Cyclobenzaprine] Rash    History  Substance Use Topics  . Smoking status: Never Smoker   . Smokeless tobacco: Never Used  . Alcohol Use: Yes     Comment: rare    Family History  Problem Relation Age of Onset  . Colon cancer Neg Hx      Review of Systems  Constitutional: Negative.   HENT: Negative for congestion, ear discharge, ear pain, hearing loss, nosebleeds, sore throat and tinnitus.   Eyes: Positive for blurred vision.  Respiratory: Positive for shortness of breath. Negative for cough, hemoptysis, sputum production, wheezing and stridor.        SOB on exertion  Cardiovascular: Negative.   Gastrointestinal: Negative.   Genitourinary: Positive for frequency. Negative for dysuria, urgency, hematuria and flank pain.  Musculoskeletal: Positive for back pain and joint pain. Negative for myalgias, falls and neck pain.       Right knee pain  Skin: Negative.   Neurological: Positive for headaches. Negative for dizziness, tingling, tremors, sensory change, speech change, focal weakness, seizures and loss of consciousness.  Endo/Heme/Allergies: Positive for environmental allergies. Negative for polydipsia. Does not bruise/bleed easily.  Psychiatric/Behavioral: Negative for depression, suicidal ideas, hallucinations and memory loss. The patient is nervous/anxious. The patient does not have insomnia.     Objective:  Physical Exam  Constitutional: She appears well-developed. No distress.  Overweight  HENT:  Head: Normocephalic and atraumatic.  Right Ear: External ear normal.  Left Ear: External ear normal.   Nose: Nose normal.  Mouth/Throat: Oropharynx is clear and moist.  Eyes: Conjunctivae and EOM are normal.  Neck: Normal range of motion. Neck supple.  Cardiovascular: Normal rate and intact distal pulses.  An irregular rhythm present.  Murmur heard.  Systolic murmur is present with a grade of 3/6  Respiratory: Effort normal and breath sounds normal. No respiratory distress. She has no wheezes.  GI: Soft. Bowel sounds are normal. She exhibits no distension.  Musculoskeletal:       Right hip: Normal.       Left hip: Normal.       Right knee: She exhibits decreased range of motion and swelling. She exhibits no effusion and no erythema. Tenderness found. Medial joint line and lateral joint line tenderness noted.       Left knee: She exhibits decreased range of motion and swelling. She exhibits no effusion and no  erythema. Tenderness found. Medial joint line and lateral joint line tenderness noted.  Evaluation of her hips show normal range of motion with no discomfort. The right knee shows no effusion. Her range of motion on the right is about 5 to 120. There is marked crepitus on range of motion. She is tender lateral greater than medial with no instability noted. Her left knee shows no effusion, with range about 5 to 125 with marked crepitus on range of motion, tender medial greater than lateral with no instability.  Neurological: She has normal strength. No sensory deficit.  Skin: She is not diaphoretic.    Vitals  Weight: 203 lb Height: 65in Body Surface Area: 1.99 m Body Mass Index: 33.78 kg/m  Pulse: 88 (Regular)  BP: 142/88 (Sitting, Left Arm, Standard)   Imaging Review Plain radiographs demonstrate severe degenerative joint disease of the right knee(s). The overall alignment ismild valgus. The bone quality appears to be good for age and reported activity level.  Assessment/Plan:  End stage primary osteoarthritis, right knee   The patient history, physical  examination, clinical judgment of the provider and imaging studies are consistent with end stage degenerative joint disease of the right knee(s) and total knee arthroplasty is deemed medically necessary. The treatment options including medical management, injection therapy arthroscopy and arthroplasty were discussed at length. The risks and benefits of total knee arthroplasty were presented and reviewed. The risks due to aseptic loosening, infection, stiffness, patella tracking problems, thromboembolic complications and other imponderables were discussed. The patient acknowledged the explanation, agreed to proceed with the plan and consent was signed. Patient is being admitted for inpatient treatment for surgery, pain control, PT, OT, prophylactic antibiotics, VTE prophylaxis, progressive ambulation and ADL's and discharge planning. The patient is planning to be discharged home with home health services   Intra-articular injection of cortisone into the left knee same day as right TKA TXA IV PCP: Dr. Hampton Abbot Cardio: Dr. Bernadette Hoit, PA-C

## 2015-01-09 ENCOUNTER — Encounter (HOSPITAL_COMMUNITY): Payer: Self-pay | Admitting: *Deleted

## 2015-01-09 ENCOUNTER — Inpatient Hospital Stay (HOSPITAL_COMMUNITY)
Admission: RE | Admit: 2015-01-09 | Discharge: 2015-01-11 | DRG: 470 | Disposition: A | Discharge status: Home Health | Payer: Medicare Other | Source: Ambulatory Visit | Attending: Orthopedic Surgery | Admitting: Orthopedic Surgery

## 2015-01-09 ENCOUNTER — Encounter (HOSPITAL_COMMUNITY)
Admission: RE | Disposition: A | Discharge status: Home Health | Payer: Self-pay | Source: Ambulatory Visit | Attending: Orthopedic Surgery

## 2015-01-09 ENCOUNTER — Inpatient Hospital Stay (HOSPITAL_COMMUNITY): Payer: Medicare Other | Admitting: Anesthesiology

## 2015-01-09 ENCOUNTER — Other Ambulatory Visit: Payer: Self-pay

## 2015-01-09 DIAGNOSIS — Z7982 Long term (current) use of aspirin: Secondary | ICD-10-CM

## 2015-01-09 DIAGNOSIS — I10 Essential (primary) hypertension: Secondary | ICD-10-CM | POA: Diagnosis present

## 2015-01-09 DIAGNOSIS — Z79899 Other long term (current) drug therapy: Secondary | ICD-10-CM

## 2015-01-09 DIAGNOSIS — Z01812 Encounter for preprocedural laboratory examination: Secondary | ICD-10-CM

## 2015-01-09 DIAGNOSIS — E663 Overweight: Secondary | ICD-10-CM | POA: Diagnosis present

## 2015-01-09 DIAGNOSIS — R0602 Shortness of breath: Secondary | ICD-10-CM

## 2015-01-09 DIAGNOSIS — M179 Osteoarthritis of knee, unspecified: Secondary | ICD-10-CM | POA: Diagnosis present

## 2015-01-09 DIAGNOSIS — I4510 Unspecified right bundle-branch block: Secondary | ICD-10-CM | POA: Diagnosis present

## 2015-01-09 DIAGNOSIS — M1710 Unilateral primary osteoarthritis, unspecified knee: Secondary | ICD-10-CM | POA: Diagnosis present

## 2015-01-09 DIAGNOSIS — Z6833 Body mass index (BMI) 33.0-33.9, adult: Secondary | ICD-10-CM | POA: Diagnosis not present

## 2015-01-09 DIAGNOSIS — M170 Bilateral primary osteoarthritis of knee: Principal | ICD-10-CM | POA: Diagnosis present

## 2015-01-09 DIAGNOSIS — M1711 Unilateral primary osteoarthritis, right knee: Secondary | ICD-10-CM

## 2015-01-09 DIAGNOSIS — M25561 Pain in right knee: Secondary | ICD-10-CM | POA: Diagnosis present

## 2015-01-09 HISTORY — PX: TOTAL KNEE ARTHROPLASTY: SHX125

## 2015-01-09 HISTORY — PX: INJECTION KNEE: SHX2446

## 2015-01-09 LAB — TYPE AND SCREEN
ABO/RH(D): A POS
Antibody Screen: NEGATIVE

## 2015-01-09 SURGERY — ARTHROPLASTY, KNEE, TOTAL
Anesthesia: Spinal | Site: Knee | Laterality: Right

## 2015-01-09 MED ORDER — DIAZEPAM 5 MG PO TABS
5.0000 mg | ORAL_TABLET | Freq: Two times a day (BID) | ORAL | Status: DC | PRN
Start: 2015-01-09 — End: 2015-01-11

## 2015-01-09 MED ORDER — BUPIVACAINE LIPOSOME 1.3 % IJ SUSP
INTRAMUSCULAR | Status: DC | PRN
  Administered 2015-01-09: 20 mL

## 2015-01-09 MED ORDER — MORPHINE SULFATE 2 MG/ML IJ SOLN
1.0000 mg | INTRAMUSCULAR | Status: DC | PRN
  Administered 2015-01-09 (×2): 1 mg via INTRAVENOUS
  Filled 2015-01-09 (×2): qty 1

## 2015-01-09 MED ORDER — ACETAMINOPHEN 10 MG/ML IV SOLN
INTRAVENOUS | Status: DC | PRN
  Administered 2015-01-09: 1000 mg via INTRAVENOUS

## 2015-01-09 MED ORDER — LACTATED RINGERS IV SOLN
INTRAVENOUS | Status: DC
Start: 2015-01-09 — End: 2015-01-09
  Administered 2015-01-09: 11:00:00 via INTRAVENOUS
  Administered 2015-01-09: 1000 mL via INTRAVENOUS

## 2015-01-09 MED ORDER — METHYLPREDNISOLONE ACETATE 40 MG/ML IJ SUSP
INTRAMUSCULAR | Status: AC
  Filled 2015-01-09: qty 2

## 2015-01-09 MED ORDER — BUPIVACAINE HCL 0.25 % IJ SOLN
INTRAMUSCULAR | Status: DC | PRN
  Administered 2015-01-09: 20 mL

## 2015-01-09 MED ORDER — METOCLOPRAMIDE HCL 5 MG/ML IJ SOLN
INTRAMUSCULAR | Status: AC
  Filled 2015-01-09: qty 2

## 2015-01-09 MED ORDER — PROPOFOL 10 MG/ML IV BOLUS
INTRAVENOUS | Status: DC | PRN
  Administered 2015-01-09: 30 mg via INTRAVENOUS
  Administered 2015-01-09: 20 mg via INTRAVENOUS

## 2015-01-09 MED ORDER — MIDAZOLAM HCL 5 MG/5ML IJ SOLN
INTRAMUSCULAR | Status: DC | PRN
  Administered 2015-01-09: 2 mg via INTRAVENOUS

## 2015-01-09 MED ORDER — DEXAMETHASONE SODIUM PHOSPHATE 10 MG/ML IJ SOLN
10.0000 mg | Freq: Once | INTRAMUSCULAR | Status: AC
  Administered 2015-01-10: 10 mg via INTRAVENOUS
  Filled 2015-01-09: qty 1

## 2015-01-09 MED ORDER — TRANEXAMIC ACID 100 MG/ML IV SOLN
1000.0000 mg | INTRAVENOUS | Status: AC
  Administered 2015-01-09: 1000 mg via INTRAVENOUS
  Filled 2015-01-09: qty 10

## 2015-01-09 MED ORDER — METOCLOPRAMIDE HCL 5 MG PO TABS
5.0000 mg | ORAL_TABLET | Freq: Three times a day (TID) | ORAL | Status: DC | PRN
  Filled 2015-01-09: qty 2

## 2015-01-09 MED ORDER — FLEET ENEMA 7-19 GM/118ML RE ENEM: 1.0000 | ENEMA | Freq: Once | RECTAL | Status: AC | PRN

## 2015-01-09 MED ORDER — PROPOFOL 10 MG/ML IV BOLUS
INTRAVENOUS | Status: AC
  Filled 2015-01-09: qty 20

## 2015-01-09 MED ORDER — ACETAMINOPHEN 10 MG/ML IV SOLN
1000.0000 mg | Freq: Once | INTRAVENOUS | Status: DC
  Filled 2015-01-09: qty 100

## 2015-01-09 MED ORDER — SODIUM CHLORIDE 0.9 % IJ SOLN
INTRAMUSCULAR | Status: DC | PRN
  Administered 2015-01-09: 30 mL

## 2015-01-09 MED ORDER — 0.9 % SODIUM CHLORIDE (POUR BTL) OPTIME
TOPICAL | Status: DC | PRN
  Administered 2015-01-09: 1000 mL

## 2015-01-09 MED ORDER — SODIUM CHLORIDE 0.9 % IJ SOLN
INTRAMUSCULAR | Status: AC
  Filled 2015-01-09: qty 50

## 2015-01-09 MED ORDER — LORATADINE 10 MG PO TABS
10.0000 mg | ORAL_TABLET | Freq: Every morning | ORAL | Status: DC
  Administered 2015-01-10 – 2015-01-11 (×2): 10 mg via ORAL
  Filled 2015-01-09 (×2): qty 1

## 2015-01-09 MED ORDER — METHOCARBAMOL 500 MG PO TABS
500.0000 mg | ORAL_TABLET | Freq: Four times a day (QID) | ORAL | Status: DC | PRN
  Administered 2015-01-09 – 2015-01-11 (×5): 500 mg via ORAL
  Filled 2015-01-09 (×5): qty 1

## 2015-01-09 MED ORDER — PROPOFOL INFUSION 10 MG/ML OPTIME
INTRAVENOUS | Status: DC | PRN
  Administered 2015-01-09: 75 ug/kg/min via INTRAVENOUS

## 2015-01-09 MED ORDER — CEFAZOLIN SODIUM-DEXTROSE 2-3 GM-% IV SOLR
2.0000 g | Freq: Four times a day (QID) | INTRAVENOUS | Status: AC
  Administered 2015-01-09 (×2): 2 g via INTRAVENOUS
  Filled 2015-01-09 (×2): qty 50

## 2015-01-09 MED ORDER — ACETAMINOPHEN 500 MG PO TABS
1000.0000 mg | ORAL_TABLET | Freq: Four times a day (QID) | ORAL | Status: AC
  Administered 2015-01-09 – 2015-01-10 (×4): 1000 mg via ORAL
  Filled 2015-01-09 (×4): qty 2

## 2015-01-09 MED ORDER — ONDANSETRON HCL 4 MG PO TABS: 4.0000 mg | ORAL_TABLET | Freq: Four times a day (QID) | ORAL | Status: DC | PRN

## 2015-01-09 MED ORDER — FLUTICASONE PROPIONATE 50 MCG/ACT NA SUSP
1.0000 | Freq: Every day | NASAL | Status: DC | PRN
  Filled 2015-01-09: qty 16

## 2015-01-09 MED ORDER — FENTANYL CITRATE 0.05 MG/ML IJ SOLN
INTRAMUSCULAR | Status: DC | PRN
  Administered 2015-01-09: 50 ug via INTRAVENOUS

## 2015-01-09 MED ORDER — PHENYLEPHRINE HCL 10 MG/ML IJ SOLN
10.0000 mg | INTRAVENOUS | Status: DC | PRN
  Administered 2015-01-09: 40 ug/min via INTRAVENOUS

## 2015-01-09 MED ORDER — DIPHENHYDRAMINE HCL 12.5 MG/5ML PO ELIX: 12.5000 mg | ORAL_SOLUTION | ORAL | Status: DC | PRN

## 2015-01-09 MED ORDER — METHOCARBAMOL 1000 MG/10ML IJ SOLN
500.0000 mg | Freq: Four times a day (QID) | INTRAVENOUS | Status: DC | PRN
  Administered 2015-01-09: 500 mg via INTRAVENOUS
  Filled 2015-01-09 (×2): qty 5

## 2015-01-09 MED ORDER — MENTHOL 3 MG MT LOZG: 1.0000 | LOZENGE | OROMUCOSAL | Status: DC | PRN

## 2015-01-09 MED ORDER — METHYLPREDNISOLONE ACETATE 40 MG/ML IJ SUSP
INTRAMUSCULAR | Status: DC | PRN
  Administered 2015-01-09: 80 mg via INTRA_ARTICULAR

## 2015-01-09 MED ORDER — POTASSIUM CHLORIDE IN NACL 20-0.9 MEQ/L-% IV SOLN
INTRAVENOUS | Status: DC
  Administered 2015-01-09: 15:00:00 via INTRAVENOUS
  Filled 2015-01-09 (×2): qty 1000

## 2015-01-09 MED ORDER — PROSIGHT PO TABS
1.0000 | ORAL_TABLET | Freq: Every morning | ORAL | Status: DC
  Administered 2015-01-10 – 2015-01-11 (×2): 1 via ORAL
  Filled 2015-01-09 (×2): qty 1

## 2015-01-09 MED ORDER — FAMOTIDINE IN NACL 20-0.9 MG/50ML-% IV SOLN
20.0000 mg | Freq: Once | INTRAVENOUS | Status: AC
  Administered 2015-01-09: 20 mg via INTRAVENOUS
  Filled 2015-01-09: qty 50

## 2015-01-09 MED ORDER — ONDANSETRON HCL 4 MG/2ML IJ SOLN: 4.0000 mg | Freq: Four times a day (QID) | INTRAMUSCULAR | Status: DC | PRN

## 2015-01-09 MED ORDER — SODIUM CHLORIDE 0.9 % IV SOLN: INTRAVENOUS | Status: DC

## 2015-01-09 MED ORDER — TRAMADOL HCL 50 MG PO TABS: 50.0000 mg | ORAL_TABLET | Freq: Four times a day (QID) | ORAL | Status: DC | PRN

## 2015-01-09 MED ORDER — BISACODYL 10 MG RE SUPP
10.0000 mg | Freq: Every day | RECTAL | Status: DC | PRN
  Administered 2015-01-11: 10 mg via RECTAL
  Filled 2015-01-09: qty 1

## 2015-01-09 MED ORDER — OXYCODONE HCL 5 MG PO TABS
5.0000 mg | ORAL_TABLET | ORAL | Status: DC | PRN
  Administered 2015-01-09 – 2015-01-11 (×10): 5 mg via ORAL
  Filled 2015-01-09 (×5): qty 1
  Filled 2015-01-09: qty 2
  Filled 2015-01-09 (×4): qty 1

## 2015-01-09 MED ORDER — DEXAMETHASONE SODIUM PHOSPHATE 10 MG/ML IJ SOLN
10.0000 mg | Freq: Once | INTRAMUSCULAR | Status: AC
  Administered 2015-01-09: 10 mg via INTRAVENOUS

## 2015-01-09 MED ORDER — METHYLPREDNISOLONE SODIUM SUCC 125 MG IJ SOLR
INTRAMUSCULAR | Status: AC
  Filled 2015-01-09: qty 2

## 2015-01-09 MED ORDER — RIVAROXABAN 10 MG PO TABS
10.0000 mg | ORAL_TABLET | Freq: Every day | ORAL | Status: DC
  Administered 2015-01-10 – 2015-01-11 (×2): 10 mg via ORAL
  Filled 2015-01-09 (×3): qty 1

## 2015-01-09 MED ORDER — BUPIVACAINE IN DEXTROSE 0.75-8.25 % IT SOLN
INTRATHECAL | Status: DC | PRN
  Administered 2015-01-09: 1.8 mL via INTRATHECAL

## 2015-01-09 MED ORDER — PHENOL 1.4 % MT LIQD: 1.0000 | OROMUCOSAL | Status: DC | PRN

## 2015-01-09 MED ORDER — HYDROMORPHONE HCL 1 MG/ML IJ SOLN: 0.2500 mg | INTRAMUSCULAR | Status: DC | PRN

## 2015-01-09 MED ORDER — SODIUM CHLORIDE 0.9 % IR SOLN
Status: DC | PRN
  Administered 2015-01-09: 1000 mL

## 2015-01-09 MED ORDER — BUPIVACAINE LIPOSOME 1.3 % IJ SUSP
20.0000 mL | Freq: Once | INTRAMUSCULAR | Status: DC
  Filled 2015-01-09: qty 20

## 2015-01-09 MED ORDER — ACETAMINOPHEN 325 MG PO TABS
650.0000 mg | ORAL_TABLET | Freq: Four times a day (QID) | ORAL | Status: DC | PRN
  Administered 2015-01-10: 650 mg via ORAL
  Filled 2015-01-09: qty 2

## 2015-01-09 MED ORDER — FENTANYL CITRATE 0.05 MG/ML IJ SOLN
INTRAMUSCULAR | Status: AC
  Filled 2015-01-09: qty 2

## 2015-01-09 MED ORDER — DOCUSATE SODIUM 100 MG PO CAPS
100.0000 mg | ORAL_CAPSULE | Freq: Two times a day (BID) | ORAL | Status: DC
  Administered 2015-01-09 – 2015-01-11 (×4): 100 mg via ORAL

## 2015-01-09 MED ORDER — BUPIVACAINE HCL (PF) 0.25 % IJ SOLN
INTRAMUSCULAR | Status: AC
  Filled 2015-01-09: qty 30

## 2015-01-09 MED ORDER — LACTATED RINGERS IV SOLN: INTRAVENOUS | Status: DC

## 2015-01-09 MED ORDER — METOCLOPRAMIDE HCL 5 MG/ML IJ SOLN
5.0000 mg | Freq: Three times a day (TID) | INTRAMUSCULAR | Status: DC | PRN
  Administered 2015-01-09: 5 mg via INTRAVENOUS

## 2015-01-09 MED ORDER — ACETAMINOPHEN 650 MG RE SUPP: 650.0000 mg | Freq: Four times a day (QID) | RECTAL | Status: DC | PRN

## 2015-01-09 MED ORDER — DEXAMETHASONE SODIUM PHOSPHATE 10 MG/ML IJ SOLN
INTRAMUSCULAR | Status: AC
  Filled 2015-01-09: qty 1

## 2015-01-09 MED ORDER — MIDAZOLAM HCL 2 MG/2ML IJ SOLN
INTRAMUSCULAR | Status: AC
  Filled 2015-01-09: qty 2

## 2015-01-09 MED ORDER — CEFAZOLIN SODIUM-DEXTROSE 2-3 GM-% IV SOLR
2.0000 g | INTRAVENOUS | Status: AC
  Administered 2015-01-09: 2 g via INTRAVENOUS

## 2015-01-09 MED ORDER — POLYETHYLENE GLYCOL 3350 17 G PO PACK: 17.0000 g | PACK | Freq: Every day | ORAL | Status: DC | PRN

## 2015-01-09 MED ORDER — PHENYLEPHRINE HCL 10 MG/ML IJ SOLN
INTRAMUSCULAR | Status: AC
  Filled 2015-01-09: qty 1

## 2015-01-09 MED ORDER — CEFAZOLIN SODIUM-DEXTROSE 2-3 GM-% IV SOLR
INTRAVENOUS | Status: AC
  Filled 2015-01-09: qty 50

## 2015-01-09 SURGICAL SUPPLY — 69 items
BAG DECANTER FOR FLEXI CONT (MISCELLANEOUS) IMPLANT
BAG ZIPLOCK 12X15 (MISCELLANEOUS) ×3 IMPLANT
BANDAGE ELASTIC 6 VELCRO ST LF (GAUZE/BANDAGES/DRESSINGS) ×3 IMPLANT
BANDAGE ESMARK 6X9 LF (GAUZE/BANDAGES/DRESSINGS) ×2 IMPLANT
BLADE SAG 18X100X1.27 (BLADE) ×3 IMPLANT
BLADE SAW SGTL 11.0X1.19X90.0M (BLADE) ×3 IMPLANT
BNDG ESMARK 6X9 LF (GAUZE/BANDAGES/DRESSINGS) ×3
BOWL SMART MIX CTS (DISPOSABLE) ×3 IMPLANT
CAP KNEE TOTAL 3 SIGMA ×3 IMPLANT
CEMENT HV SMART SET (Cement) ×6 IMPLANT
CUFF TOURN SGL QUICK 34 (TOURNIQUET CUFF) ×1
CUFF TRNQT CYL 34X4X40X1 (TOURNIQUET CUFF) ×2 IMPLANT
DECANTER SPIKE VIAL GLASS SM (MISCELLANEOUS) ×3 IMPLANT
DRAPE EXTREMITY T 121X128X90 (DRAPE) ×3 IMPLANT
DRAPE POUCH INSTRU U-SHP 10X18 (DRAPES) ×3 IMPLANT
DRAPE U-SHAPE 47X51 STRL (DRAPES) ×3 IMPLANT
DRSG ADAPTIC 3X8 NADH LF (GAUZE/BANDAGES/DRESSINGS) ×3 IMPLANT
DRSG PAD ABDOMINAL 8X10 ST (GAUZE/BANDAGES/DRESSINGS) ×3 IMPLANT
DURAPREP 26ML APPLICATOR (WOUND CARE) ×3 IMPLANT
ELECT REM PT RETURN 9FT ADLT (ELECTROSURGICAL) ×3
ELECTRODE REM PT RTRN 9FT ADLT (ELECTROSURGICAL) ×2 IMPLANT
EVACUATOR 1/8 PVC DRAIN (DRAIN) ×3 IMPLANT
FACESHIELD WRAPAROUND (MASK) ×12 IMPLANT
GAUZE SPONGE 4X4 12PLY STRL (GAUZE/BANDAGES/DRESSINGS) ×3 IMPLANT
GLOVE BIO SURGEON STRL SZ7 (GLOVE) ×3 IMPLANT
GLOVE BIO SURGEON STRL SZ7.5 (GLOVE) ×3 IMPLANT
GLOVE BIO SURGEON STRL SZ8 (GLOVE) ×6 IMPLANT
GLOVE BIOGEL PI IND STRL 6.5 (GLOVE) IMPLANT
GLOVE BIOGEL PI IND STRL 7.0 (GLOVE) ×2 IMPLANT
GLOVE BIOGEL PI IND STRL 7.5 (GLOVE) ×2 IMPLANT
GLOVE BIOGEL PI IND STRL 8 (GLOVE) ×4 IMPLANT
GLOVE BIOGEL PI IND STRL 8.5 (GLOVE) ×2 IMPLANT
GLOVE BIOGEL PI INDICATOR 6.5 (GLOVE)
GLOVE BIOGEL PI INDICATOR 7.0 (GLOVE) ×1
GLOVE BIOGEL PI INDICATOR 7.5 (GLOVE) ×1
GLOVE BIOGEL PI INDICATOR 8 (GLOVE) ×2
GLOVE BIOGEL PI INDICATOR 8.5 (GLOVE) ×1
GLOVE SURG SS PI 6.5 STRL IVOR (GLOVE) IMPLANT
GOWN STRL REUS W/TWL LRG LVL3 (GOWN DISPOSABLE) ×3 IMPLANT
GOWN STRL REUS W/TWL XL LVL3 (GOWN DISPOSABLE) ×9 IMPLANT
HANDPIECE INTERPULSE COAX TIP (DISPOSABLE) ×1
IMMOBILIZER KNEE 20 (SOFTGOODS) ×3
IMMOBILIZER KNEE 20 THIGH 36 (SOFTGOODS) ×2 IMPLANT
KIT BASIN OR (CUSTOM PROCEDURE TRAY) ×3 IMPLANT
MANIFOLD NEPTUNE II (INSTRUMENTS) ×3 IMPLANT
NDL SAFETY ECLIPSE 18X1.5 (NEEDLE) ×4 IMPLANT
NEEDLE HYPO 18GX1.5 SHARP (NEEDLE) ×2
NS IRRIG 1000ML POUR BTL (IV SOLUTION) ×3 IMPLANT
PACK TOTAL JOINT (CUSTOM PROCEDURE TRAY) ×3 IMPLANT
PAD ABD 8X10 STRL (GAUZE/BANDAGES/DRESSINGS) ×3 IMPLANT
PADDING CAST COTTON 6X4 STRL (CAST SUPPLIES) ×3 IMPLANT
PEN SKIN MARKING BROAD (MISCELLANEOUS) ×3 IMPLANT
POSITIONER SURGICAL ARM (MISCELLANEOUS) ×3 IMPLANT
SET HNDPC FAN SPRY TIP SCT (DISPOSABLE) ×2 IMPLANT
STRIP CLOSURE SKIN 1/2X4 (GAUZE/BANDAGES/DRESSINGS) ×3 IMPLANT
SUCTION FRAZIER 12FR DISP (SUCTIONS) ×3 IMPLANT
SUT ETHIBOND NAB CT1 #1 30IN (SUTURE) ×3 IMPLANT
SUT MNCRL AB 4-0 PS2 18 (SUTURE) ×3 IMPLANT
SUT VIC AB 2-0 CT1 27 (SUTURE) ×3
SUT VIC AB 2-0 CT1 TAPERPNT 27 (SUTURE) ×6 IMPLANT
SUT VLOC 180 0 24IN GS25 (SUTURE) ×3 IMPLANT
SYR 20CC LL (SYRINGE) ×3 IMPLANT
SYR 50ML LL SCALE MARK (SYRINGE) ×3 IMPLANT
TOWEL OR 17X26 10 PK STRL BLUE (TOWEL DISPOSABLE) ×3 IMPLANT
TOWEL OR NON WOVEN STRL DISP B (DISPOSABLE) ×3 IMPLANT
TRAY FOLEY CATH 14FRSI W/METER (CATHETERS) ×3 IMPLANT
WATER STERILE IRR 1500ML POUR (IV SOLUTION) ×3 IMPLANT
WRAP KNEE MAXI GEL POST OP (GAUZE/BANDAGES/DRESSINGS) ×3 IMPLANT
YANKAUER SUCT BULB TIP 10FT TU (MISCELLANEOUS) ×3 IMPLANT

## 2015-01-09 NOTE — Op Note (Signed)
Pre-operative diagnosis- Osteoarthritis  Bilateral knee(s)  Post-operative diagnosis- Osteoarthritis Bilateral knee(s)  Procedure-  Right Total Knee Arthroplasty   Left knee cortisone injection  Surgeon- Dione Plover. Tola Meas, MD  Assistant- [name], PA-C   Anesthesia-  Spinal  EBL-* No blood loss amount entered *   Drains Hemovac  Tourniquet time-  Total Tourniquet Time Documented: Thigh (Right) - 32 minutes Total: Thigh (Right) - 32 minutes     Complications- None  Condition-PACU - hemodynamically stable.   Brief Clinical Note  Bonnie Gray is a 74 y.o. year old female with end stage OA of her right knee with progressively worsening pain and dysfunction. She has constant pain, with activity and at rest and significant functional deficits with difficulties even with ADLs. She has had extensive non-op management including analgesics, injections of cortisone and viscosupplements, and home exercise program, but remains in significant pain with significant dysfunction.Radiographs show bone on bone arthritis all 3 compartments. She presents now for right Total Knee Arthroplasty.    Procedure in detail---   The patient is brought into the operating room and positioned supine on the operating table. After successful administration of  Spinal,   a tourniquet is placed high on the  Right thigh(s) and the lower extremity is prepped and draped in the usual sterile fashion. Time out is performed by the operating team. I then prepped the left knee with alcohol and injected 80 mg Depomedrol into the left knee. The  Right lower extremity is then wrapped in Esmarch, knee flexed and the tourniquet inflated to 300 mmHg.       A midline incision is made with a ten blade through the subcutaneous tissue to the level of the extensor mechanism. A fresh blade is used to make a medial parapatellar arthrotomy. Soft tissue over the proximal medial tibia is subperiosteally elevated to the joint line with  a knife and into the semimembranosus bursa with a Cobb elevator. Soft tissue over the proximal lateral tibia is elevated with attention being paid to avoiding the patellar tendon on the tibial tubercle. The patella is everted, knee flexed 90 degrees and the ACL and PCL are removed. Findings are exposed bone all 3 compartments with large global osteophytes.        The drill is used to create a starting hole in the distal femur and the canal is thoroughly irrigated with sterile saline to remove the fatty contents. The 5 degree Right  valgus alignment guide is placed into the femoral canal and the distal femoral cutting block is pinned to remove 10 mm off the distal femur. Resection is made with an oscillating saw.      The tibia is subluxed forward and the menisci are removed. The extramedullary alignment guide is placed referencing proximally at the medial aspect of the tibial tubercle and distally along the second metatarsal axis and tibial crest. The block is pinned to remove 80mm off the more deficient medial  side. Resection is made with an oscillating saw. Size 3is the most appropriate size for the tibia and the proximal tibia is prepared with the modular drill and keel punch for that size.      The femoral sizing guide is placed and size 3 is most appropriate. Rotation is marked off the epicondylar axis and confirmed by creating a rectangular flexion gap at 90 degrees. The size 3 cutting block is pinned in this rotation and the anterior, posterior and chamfer cuts are made with the oscillating saw. The intercondylar  block is then placed and that cut is made.      Trial size 3 tibial component, trial size 3 posterior stabilized femur and a 12.5  mm posterior stabilized rotating platform insert trial is placed. Full extension is achieved with excellent varus/valgus and anterior/posterior balance throughout full range of motion. The patella is everted and thickness measured to be 22  mm. Free hand resection is  taken to 12 mm, a 35 template is placed, lug holes are drilled, trial patella is placed, and it tracks normally. Osteophytes are removed off the posterior femur with the trial in place. All trials are removed and the cut bone surfaces prepared with pulsatile lavage. Cement is mixed and once ready for implantation, the size 3 tibial implant, size  3 posterior stabilized femoral component, and the size 35 patella are cemented in place and the patella is held with the clamp. The trial insert is placed and the knee held in full extension. The Exparel (20 ml mixed with 30 ml saline) and .25% Bupivicaine, are injected into the extensor mechanism, posterior capsule, medial and lateral gutters and subcutaneous tissues.  All extruded cement is removed and once the cement is hard the permanent 12.5 mm posterior stabilized rotating platform insert is placed into the tibial tray.      The wound is copiously irrigated with saline solution and the extensor mechanism closed over a hemovac drain with #1 V-loc suture. The tourniquet is released for a total tourniquet time of 32  minutes. Flexion against gravity is 140 degrees and the patella tracks normally. Subcutaneous tissue is closed with 2.0 vicryl and subcuticular with running 4.0 Monocryl. The incision is cleaned and dried and steri-strips and a bulky sterile dressing are applied. The limb is placed into a knee immobilizer and the patient is awakened and transported to recovery in stable condition.      Please note that a surgical assistant was a medical necessity for this procedure in order to perform it in a safe and expeditious manner. Surgical assistant was necessary to retract the ligaments and vital neurovascular structures to prevent injury to them and also necessary for proper positioning of the limb to allow for anatomic placement of the prosthesis.   Dione Plover Sharetta Ricchio, MD    01/09/2015, 10:50 AM

## 2015-01-09 NOTE — Anesthesia Postprocedure Evaluation (Signed)
  Anesthesia Post-op Note  Patient: Bonnie Gray  Procedure(s) Performed: Procedure(s) (LRB): RIGHT TOTAL KNEE ARTHROPLASTY (Right) KNEE INJECTION (Left)  Patient Location: PACU  Anesthesia Type: Spinal  Level of Consciousness: awake and alert   Airway and Oxygen Therapy: Patient Spontanous Breathing  Post-op Pain: mild  Post-op Assessment: Post-op Vital signs reviewed, Patient's Cardiovascular Status Stable, Respiratory Function Stable, Patent Airway and No signs of Nausea or vomiting  Last Vitals:  Filed Vitals:   01/09/15 1230  BP: 156/76  Pulse: 65  Temp: 36.7 C  Resp: 16    Post-op Vital Signs: stable   Complications: No apparent anesthesia complications

## 2015-01-09 NOTE — Anesthesia Preprocedure Evaluation (Addendum)
Anesthesia Evaluation  Patient identified by MRN, date of birth, ID band Patient awake    Reviewed: Allergy & Precautions, H&P , NPO status , Patient's Chart, lab work & pertinent test results  Airway Mallampati: II  TM Distance: >3 FB Neck ROM: full    Dental  (+) Missing, Dental Advisory Given Many side teeth missing:   Pulmonary shortness of breath and with exertion,  breath sounds clear to auscultation  Pulmonary exam normal       Cardiovascular Exercise Tolerance: Good hypertension, Pt. on medications Rhythm:regular Rate:Normal  RBBB   Neuro/Psych negative neurological ROS  negative psych ROS   GI/Hepatic negative GI ROS, Neg liver ROS,   Endo/Other  negative endocrine ROS  Renal/GU negative Renal ROS  negative genitourinary   Musculoskeletal   Abdominal (+) + obese,   Peds  Hematology negative hematology ROS (+)   Anesthesia Other Findings   Reproductive/Obstetrics negative OB ROS                           Anesthesia Physical Anesthesia Plan  ASA: II  Anesthesia Plan: Spinal   Post-op Pain Management:    Induction:   Airway Management Planned: Simple Face Mask  Additional Equipment:   Intra-op Plan:   Post-operative Plan:   Informed Consent: I have reviewed the patients History and Physical, chart, labs and discussed the procedure including the risks, benefits and alternatives for the proposed anesthesia with the patient or authorized representative who has indicated his/her understanding and acceptance.   Dental Advisory Given  Plan Discussed with: CRNA and Surgeon  Anesthesia Plan Comments:        Anesthesia Quick Evaluation

## 2015-01-09 NOTE — Evaluation (Signed)
Physical Therapy Evaluation Patient Details Name: Bonnie Gray MRN: [id number] DOB: 07/05/1941 Today's Date: 01/09/2015   History of Present Illness  s/p RTKA  Clinical Impression  S/p RTKA presents with decreased mobility, ROM and strength to benefit from PT to increase safety and independence to return home safely.     Follow Up Recommendations Home health PT    Equipment Recommendations  Rolling walker with 5" wheels    Recommendations for Other Services       Precautions / Restrictions Precautions Precautions: Knee Required Braces or Orthoses: Knee Immobilizer - Right Knee Immobilizer - Right: Discontinue once straight leg raise with < 10 degree lag Restrictions Weight Bearing Restrictions: No      Mobility  Bed Mobility Overal bed mobility: Needs Assistance Bed Mobility: Supine to Sit;Sit to Supine     Supine to sit: Min assist     General bed mobility comments: assisted LE   Transfers Overall transfer level: Needs assistance Equipment used: Rolling walker (2 wheeled) Transfers: Sit to/from Stand Sit to Stand: Min assist         General transfer comment: cues for RW safety and assistance to rise due to L knee very painful and "not in that good of shape" per pt  Ambulation/Gait Ambulation/Gait assistance: Min assist Ambulation Distance (Feet): 45 Feet Assistive device: Rolling walker (2 wheeled) Gait Pattern/deviations: Step-to pattern     General Gait Details: cues for sequencing  Stairs            Wheelchair Mobility    Modified Rankin (Stroke Patients Only)       Balance Overall balance assessment: No apparent balance deficits (not formally assessed)                                           Pertinent Vitals/Pain Pain Assessment: 0-10 Pain Score: 4  Pain Location: R knee incision  Pain Descriptors / Indicators: Aching Pain Intervention(s): Monitored during session;Premedicated before session;Ice applied     Home Living Family/patient expects to be discharged to:: Private residence Living Arrangements: Children (dtr coming to stay with her for a while) Available Help at Discharge: Family Type of Home: House Home Access: Stairs to enter Entrance Stairs-Rails: None Entrance Stairs-Number of Steps: 1 +1 Home Layout: One level Home Equipment: Walker - 4 wheels (not her though, it is borrowed)      Prior Function Level of Independence: Independent with assistive device(s)         Comments: used cane     Hand Dominance        Extremity/Trunk Assessment               Lower Extremity Assessment: RLE deficits/detail RLE Deficits / Details: grossly 0-60 supine, adn almost SLR 10x not quite yet       Communication   Communication: No difficulties  Cognition Arousal/Alertness: Awake/alert Behavior During Therapy: WFL for tasks assessed/performed Overall Cognitive Status: Within Functional Limits for tasks assessed                      General Comments      Exercises Total Joint Exercises Ankle Circles/Pumps: PROM;Right;5 reps;Supine Quad Sets: AROM;Right;5 reps;Supine Heel Slides: AAROM;Supine;Right;5 reps Straight Leg Raises: AAROM;Supine;Right;5 reps      Assessment/Plan    PT Assessment Patient needs continued PT services  PT Diagnosis Difficulty walking   PT Problem  List Decreased strength;Decreased range of motion;Decreased mobility;Decreased activity tolerance  PT Treatment Interventions DME instruction;Gait training;Stair training;Patient/family education;Functional mobility training;Therapeutic activities;Therapeutic exercise   PT Goals (Current goals can be found in the Care Plan section) Acute Rehab PT Goals Patient Stated Goal: to be able to walk agian without so much pain and giving way PT Goal Formulation: With patient Time For Goal Achievement: 01/23/15 Potential to Achieve Goals: Good    Frequency 7X/week   Barriers to discharge         Co-evaluation               End of Session Equipment Utilized During Treatment: Gait belt Activity Tolerance: Patient tolerated treatment well Patient left: in chair;with family/visitor present Nurse Communication: Mobility status         Time: 1740-1809 PT Time Calculation (min) (ACUTE ONLY): 29 min   Charges:   PT Evaluation $Initial PT Evaluation Tier I: 1 Procedure PT Treatments $Gait Training: 8-22 mins   PT G CodesClide Dales 01-10-2015, 7:41 PM  Clide Dales, PT Pager: [phone] 2015-01-10

## 2015-01-09 NOTE — Anesthesia Procedure Notes (Signed)
Spinal Patient location during procedure: OR Start time: 01/09/2015 9:52 AM End time: 01/09/2015 9:57 AM Reason for block: at surgeon's request Staffing Resident/CRNA: Anne Fu Performed by: resident/CRNA  Preanesthetic Checklist Completed: patient identified, site marked, surgical consent, pre-op evaluation, timeout performed, IV checked, risks and benefits discussed, monitors and equipment checked and at surgeon's request Spinal Block Patient position: sitting Approach: right paramedian Location: L3-4 Injection technique: single-shot Needle Needle type: Spinocan  Needle gauge: 22 G Needle length: 9 cm Assessment Sensory level: T6 Additional Notes Expiration date of kit checked and confirmed. Patient tolerated procedure well, without complications. X 1 attempt with noted clear CSF return. Loss of motor and sensory on exam post injection.

## 2015-01-09 NOTE — Plan of Care (Signed)
Problem: Consults Goal: Diagnosis- Total Joint Replacement Primary Total Knee, Right

## 2015-01-09 NOTE — Transfer of Care (Signed)
Immediate Anesthesia Transfer of Care Note  Patient: Bonnie Gray  Procedure(s) Performed: Procedure(s) (LRB): RIGHT TOTAL KNEE ARTHROPLASTY (Right) KNEE INJECTION (Left)  Patient Location: PACU  Anesthesia Type: Spinal  Level of Consciousness: sedated, patient cooperative and responds to stimulation  Airway & Oxygen Therapy: Patient Spontanous Breathing and Patient connected to face mask oxgen  Post-op Assessment: Report given to PACU RN and Post -op Vital signs reviewed and stable  Post vital signs: Reviewed and stable  Complications: No apparent anesthesia complications

## 2015-01-09 NOTE — Interval H&P Note (Signed)
History and Physical Interval Note:  01/09/2015 8:56 AM  Bonnie Gray  has presented today for surgery, with the diagnosis of right knee osteoarthritis  The various methods of treatment have been discussed with the patient and family. After consideration of risks, benefits and other options for treatment, the patient has consented to  Procedure(s): RIGHT TOTAL KNEE ARTHROPLASTY (Right) as a surgical intervention .  The patient's history has been reviewed, patient examined, no change in status, stable for surgery.  I have reviewed the patient's chart and labs.  Questions were answered to the patient's satisfaction.     Gearlean Alf

## 2015-01-10 ENCOUNTER — Inpatient Hospital Stay (HOSPITAL_COMMUNITY): Payer: Medicare Other

## 2015-01-10 LAB — CBC
HCT: 34.6 % — ABNORMAL LOW (ref 36.0–46.0)
HEMOGLOBIN: 11.4 g/dL — AB (ref 12.0–15.0)
MCH: 29.5 pg (ref 26.0–34.0)
MCHC: 32.9 g/dL (ref 30.0–36.0)
MCV: 89.4 fL (ref 78.0–100.0)
Platelets: 241 10*3/uL (ref 150–400)
RBC: 3.87 MIL/uL (ref 3.87–5.11)
RDW: 13 % (ref 11.5–15.5)
WBC: 18.3 10*3/uL — ABNORMAL HIGH (ref 4.0–10.5)

## 2015-01-10 LAB — BASIC METABOLIC PANEL
Anion gap: 8 (ref 5–15)
BUN: 8 mg/dL (ref 6–23)
CHLORIDE: 101 mmol/L (ref 96–112)
CO2: 27 mmol/L (ref 19–32)
Calcium: 8.1 mg/dL — ABNORMAL LOW (ref 8.4–10.5)
Creatinine, Ser: 0.61 mg/dL (ref 0.50–1.10)
GFR calc Af Amer: >90 mL/min (ref >90)
GFR calc non Af Amer: 87 mL/min — ABNORMAL LOW (ref >90)
Glucose, Bld: 177 mg/dL — ABNORMAL HIGH (ref 70–99)
POTASSIUM: 4 mmol/L (ref 3.5–5.1)
Sodium: 136 mmol/L (ref 135–145)

## 2015-01-10 MED ORDER — METHOCARBAMOL 500 MG PO TABS: 500.0000 mg | ORAL_TABLET | Freq: Four times a day (QID) | ORAL | Status: DC | PRN

## 2015-01-10 MED ORDER — OXYCODONE HCL 5 MG PO TABS: 5.0000 mg | ORAL_TABLET | ORAL | Status: DC | PRN

## 2015-01-10 MED ORDER — TRAMADOL HCL 50 MG PO TABS: 50.0000 mg | ORAL_TABLET | Freq: Four times a day (QID) | ORAL | Status: DC | PRN

## 2015-01-10 MED ORDER — RIVAROXABAN 10 MG PO TABS: 10.0000 mg | ORAL_TABLET | Freq: Every day | ORAL | Status: DC

## 2015-01-10 NOTE — Progress Notes (Signed)
   Subjective: 1 Day Post-Op Procedure(s) (LRB): RIGHT TOTAL KNEE ARTHROPLASTY (Right) KNEE INJECTION (Left) Patient reports pain as moderate last night but better this morning.  Sat up in the chair for three hours yesterday. Patient seen in rounds with Dr. Wynelle Link. Patient is well, but has had some minor complaints of pain in the knee, requiring pain medications We will start therapy today.  Plan is to go Home after hospital stay.  Objective: Vital signs in last 24 hours: Temp:  [97.5 F (36.4 C)-98.2 F (36.8 C)] 97.9 F (36.6 C) (04/05 0717) Pulse Rate:  [64-84] 70 (04/05 0717) Resp:  [13-18] 16 (04/05 0724) BP: (133-169)/(53-82) 169/77 mmHg (04/05 0717) SpO2:  [91 %-100 %] 95 % (04/05 0717)  Intake/Output from previous day:  Intake/Output Summary (Last 24 hours) at 01/10/15 0855 Last data filed at 01/10/15 0630  Gross per 24 hour  Intake 3973.17 ml  Output   3320 ml  Net 653.17 ml    Intake/Output this shift: UOP 425 since MN  Labs:  Recent Labs  01/10/15 0441  HGB 11.4*    Recent Labs  01/10/15 0441  WBC 18.3*  RBC 3.87  HCT 34.6*  PLT 241    Recent Labs  01/10/15 0441  NA 136  K 4.0  CL 101  CO2 27  BUN 8  CREATININE 0.61  GLUCOSE 177*  CALCIUM 8.1*   No results for input(s): LABPT, INR in the last 72 hours.  EXAM General - Patient is Alert, Appropriate and Oriented Extremity - Neurovascular intact Sensation intact distally Dorsiflexion/Plantar flexion intact Dressing - dressing C/D/I Motor Function - intact, moving foot and toes well on exam.  Hemovac pulled without difficulty.  Past Medical History  Diagnosis Date  . Hypertension   . Osteoarthritis     knees  . Diarrhea   . Diverticulosis   . Anxiety   . Anemia     as a child  . Cataract     right  . Right bundle branch block   . History of blood transfusion   . Shortness of breath dyspnea     "because of the weight"/01/02/15- states unchanged    Assessment/Plan: 1  Day Post-Op Procedure(s) (LRB): RIGHT TOTAL KNEE ARTHROPLASTY (Right) KNEE INJECTION (Left) Principal Problem:   OA (osteoarthritis) of knee  Estimated body mass index is 33.61 kg/(m^2) as calculated from the following:   Height as of this encounter: 5\' 5"  (1.651 m).   Weight as of this encounter: 91.627 kg (202 lb). Advance diet Up with therapy Plan for discharge tomorrow Discharge home with home health  DVT Prophylaxis - Xarelto Weight-Bearing as tolerated to right leg D/C O2 and Pulse OX and try on Room Air  [name], PA-C Orthopaedic Surgery 01/10/2015, 8:55 AM

## 2015-01-10 NOTE — Progress Notes (Signed)
Physical Therapy Treatment Patient Details Name: Bonnie Gray MRN: [id number] DOB: 10-Apr-1941 Today's Date: 01/10/2015    History of Present Illness s/p RTKA    PT Comments    POD # 1 applied KI and assisted pt OOB to [facility] to void then amb in hallway.  Pt progressing well.  RA during gait 96%.  Pt plans to D/C to home tomorrow with help from daughter.  Follow Up Recommendations  Home health PT     Equipment Recommendations  Rolling walker with 5" wheels    Recommendations for Other Services       Precautions / Restrictions Precautions Precautions: Knee;Fall Precaution Comments: instructed pt and daughter on KI use for amb Required Braces or Orthoses: Knee Immobilizer - Right Knee Immobilizer - Right: Discontinue once straight leg raise with < 10 degree lag Restrictions Weight Bearing Restrictions: No    Mobility  Bed Mobility               General bed mobility comments: Pt OOB in recliner  Transfers Overall transfer level: Needs assistance Equipment used: Rolling walker (2 wheeled) Transfers: Sit to/from Stand Sit to Stand: Supervision;Min guard         General transfer comment: 50% VC's on proper hand placement  Ambulation/Gait Ambulation/Gait assistance: Min assist Ambulation Distance (Feet): 55 Feet Assistive device: Rolling walker (2 wheeled) Gait Pattern/deviations: Step-to pattern Gait velocity: decreased   General Gait Details: cues for sequencing and proper walker to self distance   Stairs            Wheelchair Mobility    Modified Rankin (Stroke Patients Only)       Balance                                    Cognition Arousal/Alertness: Awake/alert Behavior During Therapy: WFL for tasks assessed/performed Overall Cognitive Status: Within Functional Limits for tasks assessed                      Exercises      General Comments        Pertinent Vitals/Pain Pain Assessment: 0-10 Pain  Score: 6  Pain Location: R knee Pain Descriptors / Indicators: Aching;Sore;Tender Pain Intervention(s): Monitored during session;Premedicated before session;Repositioned;Ice applied    Home Living                      Prior Function            PT Goals (current goals can now be found in the care plan section) Progress towards PT goals: Progressing toward goals    Frequency  7X/week    PT Plan      Co-evaluation             End of Session Equipment Utilized During Treatment: Gait belt;Right knee immobilizer Activity Tolerance: Patient tolerated treatment well Patient left: in chair;with family/visitor present     Time: 1430-1456 PT Time Calculation (min) (ACUTE ONLY): 26 min  Charges:  $Gait Training: 8-22 mins $Therapeutic Activity: 8-22 mins                    G Codes:      Rica Koyanagi  PTA [facility]  Acute  Rehab Pager      [phone]

## 2015-01-10 NOTE — Plan of Care (Signed)
Problem: Consults Goal: Diagnosis- Total Joint Replacement Outcome: Completed/Met Date Met:  01/10/15 Primary Total Knee RIGHT

## 2015-01-10 NOTE — Discharge Summary (Signed)
Physician Discharge Summary   Patient ID: Bonnie Gray MRN: [id number] DOB/AGE: 06-29-1941 74 y.o.  Admit date: 01/09/2015 Discharge date: 01/11/2015  Primary Diagnosis:  Osteoarthritis Bilateral knee(s) Admission Diagnoses:  Past Medical History  Diagnosis Date  . Hypertension   . Osteoarthritis     knees  . Diarrhea   . Diverticulosis   . Anxiety   . Anemia     as a child  . Cataract     right  . Right bundle branch block   . History of blood transfusion   . Shortness of breath dyspnea     "because of the weight"/01/02/15- states unchanged   Discharge Diagnoses:   Principal Problem:   OA (osteoarthritis) of knee  Estimated body mass index is 33.61 kg/(m^2) as calculated from the following:   Height as of this encounter: _0  (1.651 m).   Weight as of this encounter: 91.627 kg (202 lb).  Procedure:  Procedure(s) (LRB): RIGHT TOTAL KNEE ARTHROPLASTY (Right) KNEE INJECTION (Left)   Consults: None  HPI: Bonnie Gray is a 74 y.o. year old female with end stage OA of her right knee with progressively worsening pain and dysfunction. She has constant pain, with activity and at rest and significant functional deficits with difficulties even with ADLs. She has had extensive non-op management including analgesics, injections of cortisone and viscosupplements, and home exercise program, but remains in significant pain with significant dysfunction.Radiographs show bone on bone arthritis all 3 compartments. She presents now for right Total Knee Arthroplasty.   Laboratory Data: Admission on 01/09/2015  Component Date Value Ref Range Status  . WBC 01/10/2015 18.3* 4.0 - 10.5 K/uL Final  . RBC 01/10/2015 3.87  3.87 - 5.11 MIL/uL Final  . Hemoglobin 01/10/2015 11.4* 12.0 - 15.0 g/dL Final  . HCT 01/10/2015 34.6* 36.0 - 46.0 % Final  . MCV 01/10/2015 89.4  78.0 - 100.0 fL Final  . MCH 01/10/2015 29.5  26.0 - 34.0 pg Final  . MCHC 01/10/2015 32.9  30.0 - 36.0 g/dL Final   . RDW 01/10/2015 13.0  11.5 - 15.5 % Final  . Platelets 01/10/2015 241  150 - 400 K/uL Final  . Sodium 01/10/2015 136  135 - 145 mmol/L Final  . Potassium 01/10/2015 4.0  3.5 - 5.1 mmol/L Final  . Chloride 01/10/2015 101  96 - 112 mmol/L Final  . CO2 01/10/2015 27  19 - 32 mmol/L Final  . Glucose, Bld 01/10/2015 177* 70 - 99 mg/dL Final  . BUN 01/10/2015 8  6 - 23 mg/dL Final  . Creatinine, Ser 01/10/2015 0.61  0.50 - 1.10 mg/dL Final  . Calcium 01/10/2015 8.1* 8.4 - 10.5 mg/dL Final  . GFR calc non Af Amer 01/10/2015 87* >90 mL/min Final  . GFR calc Af Amer 01/10/2015 >90  >90 mL/min Final   Comment: (NOTE) The eGFR has been calculated using the CKD EPI equation. This calculation has not been validated in all clinical situations. eGFR's persistently <90 mL/min signify possible Chronic Kidney Disease.   Georgiann Hahn gap 01/10/2015 8  5 - 15 Final  Hospital Outpatient Visit on 01/02/2015  Component Date Value Ref Range Status  . aPTT 01/02/2015 30  24 - 37 seconds Final  . WBC 01/02/2015 8.5  4.0 - 10.5 K/uL Final  . RBC 01/02/2015 4.65  3.87 - 5.11 MIL/uL Final  . Hemoglobin 01/02/2015 13.7  12.0 - 15.0 g/dL Final  . HCT 01/02/2015 41.3  36.0 - 46.0 % Final  .  MCV 01/02/2015 88.8  78.0 - 100.0 fL Final  . MCH 01/02/2015 29.5  26.0 - 34.0 pg Final  . MCHC 01/02/2015 33.2  30.0 - 36.0 g/dL Final  . RDW 01/02/2015 13.2  11.5 - 15.5 % Final  . Platelets 01/02/2015 239  150 - 400 K/uL Final  . Sodium 01/02/2015 138  135 - 145 mmol/L Final  . Potassium 01/02/2015 3.9  3.5 - 5.1 mmol/L Final  . Chloride 01/02/2015 98  96 - 112 mmol/L Final  . CO2 01/02/2015 29  19 - 32 mmol/L Final  . Glucose, Bld 01/02/2015 104* 70 - 99 mg/dL Final  . BUN 01/02/2015 10  6 - 23 mg/dL Final  . Creatinine, Ser 01/02/2015 0.63  0.50 - 1.10 mg/dL Final  . Calcium 01/02/2015 9.7  8.4 - 10.5 mg/dL Final  . Total Protein 01/02/2015 7.5  6.0 - 8.3 g/dL Final  . Albumin 01/02/2015 4.5  3.5 - 5.2 g/dL Final  .  AST 01/02/2015 18  0 - 37 U/L Final  . ALT 01/02/2015 17  0 - 35 U/L Final  . Alkaline Phosphatase 01/02/2015 52  39 - 117 U/L Final  . Total Bilirubin 01/02/2015 0.8  0.3 - 1.2 mg/dL Final  . GFR calc non Af Amer 01/02/2015 86* >90 mL/min Final  . GFR calc Af Amer 01/02/2015 >90  >90 mL/min Final   Comment: (NOTE) The eGFR has been calculated using the CKD EPI equation. This calculation has not been validated in all clinical situations. eGFR's persistently <90 mL/min signify possible Chronic Kidney Disease.   . Anion gap 01/02/2015 11  5 - 15 Final  . Prothrombin Time 01/02/2015 12.9  11.6 - 15.2 seconds Final  . INR 01/02/2015 0.96  0.00 - 1.49 Final  . ABO/RH(D) 01/02/2015 A POS   Final  . Antibody Screen 01/02/2015 NEG   Final  . Sample Expiration 01/02/2015 01/12/2015   Final  . Color, Urine 01/02/2015 YELLOW  YELLOW Final  . APPearance 01/02/2015 CLEAR  CLEAR Final  . Specific Gravity, Urine 01/02/2015 1.007  1.005 - 1.030 Final  . pH 01/02/2015 7.5  5.0 - 8.0 Final  . Glucose, UA 01/02/2015 NEGATIVE  NEGATIVE mg/dL Final  . Hgb urine dipstick 01/02/2015 NEGATIVE  NEGATIVE Final  . Bilirubin Urine 01/02/2015 NEGATIVE  NEGATIVE Final  . Ketones, ur 01/02/2015 NEGATIVE  NEGATIVE mg/dL Final  . Protein, ur 01/02/2015 NEGATIVE  NEGATIVE mg/dL Final  . Urobilinogen, UA 01/02/2015 0.2  0.0 - 1.0 mg/dL Final  . Nitrite 01/02/2015 NEGATIVE  NEGATIVE Final  . Leukocytes, UA 01/02/2015 NEGATIVE  NEGATIVE Final   MICROSCOPIC NOT DONE ON URINES WITH NEGATIVE PROTEIN, BLOOD, LEUKOCYTES, NITRITE, OR GLUCOSE <1000 mg/dL.  Marland Kitchen MRSA, PCR 01/02/2015 NEGATIVE  NEGATIVE Final  . Staphylococcus aureus 01/02/2015 NEGATIVE  NEGATIVE Final   Comment:        The Xpert SA Assay (FDA approved for NASAL specimens in patients over 71 years of age), is one component of a comprehensive surveillance program.  Test performance has been validated by [facility] for patients greater than or equal to 71  year old. It is not intended to diagnose infection nor to guide or monitor treatment.      X-Rays:Dg Chest Port 1 View  01/10/2015   CLINICAL DATA:  Chronic shortness of breath for 6 years  EXAM: PORTABLE CHEST - 1 VIEW  COMPARISON:  11/15/2014  FINDINGS: Cardiac shadow is stable. The lungs are clear bilaterally. No focal infiltrate or sizable  effusion is seen. No bony abnormality is noted.  IMPRESSION: No active disease.   Electronically Signed   By: Inez Catalina M.D.   On: 01/10/2015 13:44    EKG: Orders placed or performed in visit on 01/09/15  . EKG 12-Lead     Hospital Course: Bonnie Gray is a 74 y.o. who was admitted to [facility]. They were brought to the operating room on 01/09/2015 and underwent Procedure(s): RIGHT TOTAL KNEE ARTHROPLASTY KNEE INJECTION.  Patient tolerated the procedure well and was later transferred to the recovery room and then to the orthopaedic floor for postoperative care.  They were given PO and IV analgesics for pain control following their surgery.  They were given 24 hours of postoperative antibiotics of  Anti-infectives    Start     Dose/Rate Route Frequency Ordered Stop   01/09/15 1600  ceFAZolin (ANCEF) IVPB 2 g/50 mL premix     2 g 100 mL/hr over 30 Minutes Intravenous Every 6 hours 01/09/15 1332 01/09/15 2156   01/09/15 0725  ceFAZolin (ANCEF) IVPB 2 g/50 mL premix     2 g 100 mL/hr over 30 Minutes Intravenous On call to O.R. 01/09/15 0725 01/09/15 1000     and started on DVT prophylaxis in the form of Xarelto.   PT and OT were ordered for total joint protocol.  Discharge planning consulted to help with postop disposition and equipment needs.  Patient had a tough night on the evening of surgery with pain but better the next morning.  They started to get up OOB with therapy on day one. Hemovac drain was pulled without difficulty.  Continued to work with therapy into day two.  Dressing was changed on day two and the incision was healing  well.   Patient was seen in rounds and was ready to go home.  Discharge home with home health Diet - Cardiac diet Follow up - in 2 weeks Activity - WBAT Disposition - Home Condition Upon Discharge - Good D/C Meds - See DC Summary DVT Prophylaxis - Xarelto      Discharge Instructions    Call MD / Call 911    Complete by:  As directed   If you experience chest pain or shortness of breath, CALL 911 and be transported to the hospital emergency room.  If you develope a fever above 101 F, pus (white drainage) or increased drainage or redness at the wound, or calf pain, call your surgeon's office.     Change dressing    Complete by:  As directed   Change dressing daily with sterile 4 x 4 inch gauze dressing and apply TED hose. Do not submerge the incision under water.     Constipation Prevention    Complete by:  As directed   Drink plenty of fluids.  Prune juice may be helpful.  You may use a stool softener, such as Colace (over the counter) 100 mg twice a day.  Use MiraLax (over the counter) for constipation as needed.     Diet - low sodium heart healthy    Complete by:  As directed      Discharge instructions    Complete by:  As directed   Pick up stool softner and laxative for home use following surgery while on pain medications. Do not submerge incision under water. Please use good hand washing techniques while changing dressing each day. May shower starting three days after surgery. Please use a clean towel to pat the incision  dry following showers. Continue to use ice for pain and swelling after surgery. Do not use any lotions or creams on the incision until instructed by your surgeon.  Take Xarelto for two and a half more weeks, then discontinue Xarelto. Once the patient has completed the Xarelto, they may resume the 81 mg Aspirin.  Postoperative Constipation Protocol  Constipation - defined medically as fewer than three stools per week and severe constipation as less than one  stool per week.  One of the most common issues patients have following surgery is constipation.  Even if you have a regular bowel pattern at home, your normal regimen is likely to be disrupted due to multiple reasons following surgery.  Combination of anesthesia, postoperative narcotics, change in appetite and fluid intake all can affect your bowels.  In order to avoid complications following surgery, here are some recommendations in order to help you during your recovery period.  Colace (docusate) - Pick up an over-the-counter form of Colace or another stool softener and take twice a day as long as you are requiring postoperative pain medications.  Take with a full glass of water daily.  If you experience loose stools or diarrhea, hold the colace until you stool forms back up.  If your symptoms do not get better within 1 week or if they get worse, check with your doctor.  Dulcolax (bisacodyl) - Pick up over-the-counter and take as directed by the product packaging as needed to assist with the movement of your bowels.  Take with a full glass of water.  Use this product as needed if not relieved by Colace only.   MiraLax (polyethylene glycol) - Pick up over-the-counter to have on hand.  MiraLax is a solution that will increase the amount of water in your bowels to assist with bowel movements.  Take as directed and can mix with a glass of water, juice, soda, coffee, or tea.  Take if you go more than two days without a movement. Do not use MiraLax more than once per day. Call your doctor if you are still constipated or irregular after using this medication for 7 days in a row.  If you continue to have problems with postoperative constipation, please contact the office for further assistance and recommendations.  If you experience "the worst abdominal pain ever" or develop nausea or vomiting, please contact the office immediatly for further recommendations for treatment.     Do not put a pillow under the  knee. Place it under the heel.    Complete by:  As directed      Do not sit on low chairs, stoools or toilet seats, as it may be difficult to get up from low surfaces    Complete by:  As directed      Driving restrictions    Complete by:  As directed   No driving until released by the physician.     Increase activity slowly as tolerated    Complete by:  As directed      Lifting restrictions    Complete by:  As directed   No lifting until released by the physician.     Patient may shower    Complete by:  As directed   You may shower without a dressing once there is no drainage.  Do not wash over the wound.  If drainage remains, do not shower until drainage stops.     TED hose    Complete by:  As directed   Use stockings (  TED hose) for 3 weeks on both leg(s).  You may remove them at night for sleeping.     Weight bearing as tolerated    Complete by:  As directed   Laterality:  right  Extremity:  Lower            Medication List    STOP taking these medications        aspirin EC 81 MG tablet     cholecalciferol 1000 UNITS tablet  Commonly known as:  VITAMIN D     meloxicam 15 MG tablet  Commonly known as:  MOBIC     multivitamin with minerals Tabs tablet     VITAMIN B 12 PO      TAKE these medications        alum hydroxide-mag trisilicate 41-74 MG Chew chewable tablet  Commonly known as:  GAVISCON  Chew 2 tablets by mouth 2 (two) times daily as needed for indigestion.     beta carotene w/minerals tablet  Take 1 tablet by mouth every morning.     diazepam 10 MG tablet  Commonly known as:  VALIUM  Take 5-10 mg by mouth 2 (two) times daily as needed for anxiety (mucsle spasm.).     dicyclomine 10 MG capsule  Commonly known as:  BENTYL  Take 1 capsule (10 mg total) by mouth 4 (four) times daily -  before meals and at bedtime.     DRAMAMINE PO  Take 0.5-1 tablets by mouth at bedtime as needed (sleep.).     fluticasone 50 MCG/ACT nasal spray  Commonly known as:   FLONASE  Place 1 spray into both nostrils daily as needed for allergies or rhinitis.     guaiFENesin-codeine 100-10 MG/5ML syrup  Commonly known as:  ROBITUSSIN AC  Take 15 mLs by mouth 2 (two) times daily as needed for cough or congestion.     lisinopril-hydrochlorothiazide 20-12.5 MG per tablet  Commonly known as:  PRINZIDE,ZESTORETIC  Take 2 tablets by mouth every morning.     loratadine 10 MG tablet  Commonly known as:  CLARITIN  Take 10 mg by mouth every morning.     methocarbamol 500 MG tablet  Commonly known as:  ROBAXIN  Take 1 tablet (500 mg total) by mouth every 6 (six) hours as needed for muscle spasms.     oxyCODONE 5 MG immediate release tablet  Commonly known as:  Oxy IR/ROXICODONE  Take 1-2 tablets (5-10 mg total) by mouth every 3 (three) hours as needed for moderate pain, severe pain or breakthrough pain.     rivaroxaban 10 MG Tabs tablet  Commonly known as:  XARELTO  - Take 1 tablet (10 mg total) by mouth daily with breakfast. Take Xarelto for two and a half more weeks, then discontinue Xarelto.  - Once the patient has completed the Xarelto, they may resume the 81 mg Aspirin.     SYSTANE OP  Apply 1 drop to eye 2 (two) times daily as needed (Dry eyes).     traMADol 50 MG tablet  Commonly known as:  ULTRAM  Take 1-2 tablets (50-100 mg total) by mouth every 6 (six) hours as needed (mild pain).       Follow-up Information    Follow up with [facility].   Why:  home health physical therapy   Contact information:   [place] [address] [phone]       Follow up with [facility].  Why:  rolling walker   Contact information:   [address] [place] [address] [phone]       Follow up with Gearlean Alf, MD. Schedule an appointment as soon as possible for a visit on 01/24/2015.   Specialty:  Orthopedic Surgery   Why:  Call office at [phone] to setup appointment with Dr. Denman George information:   [place] [address] [phone]       Signed: [name], PA-C Orthopaedic Surgery 01/10/2015, 7:46 PM

## 2015-01-10 NOTE — Care Management Note (Signed)
    Page 1 of 2   01/10/2015     2:39:03 PM CARE MANAGEMENT NOTE 01/10/2015  Patient:  EVAH, RASHID   Account Number:  [id number]  Date Initiated:  01/10/2015  Documentation initiated by:  [facility]  Subjective/Objective Assessment:   adm: RIGHT TOTAL KNEE ARTHROPLASTY (Right)  KNEE INJECTION (Left)     Action/Plan:   discharge planning   Anticipated DC Date:  01/11/2015   Anticipated DC Plan:  [place]  CM consult      [facility] Choice  HOME HEALTH   Choice offered to / List presented to:  C-1 Patient   DME arranged  Vassie Moselle      DME agency  [place] arranged  [place]   Status of service:  Completed, signed off Medicare Important Message given?   (If response is "NO", the following Medicare IM given date fields will be blank) Date Medicare IM given:   Medicare IM given by:   Date Additional Medicare IM given:   Additional Medicare IM given by:    Discharge Disposition:  [place]  Per UR Regulation:    If discussed at Long Length of Stay Meetings, dates discussed:    Comments:  01/10/15 14:00 Cm met with pt in room to offer choice of home health agency.  Pt chooses [facility] to render HHPT.  Address and contact information verified by pt.  Referral emailed to [employer], Tim.  CM called [facility] DME rep, Lecretia to please deliver rolling walker to room. No other CM needs were communicated.  Mariane Masters, BSN, CM [phone].

## 2015-01-10 NOTE — Discharge Instructions (Addendum)
Dr. Gaynelle Arabian Total Joint Specialist [facility] [address] [place], [place] [address] [phone]  TOTAL KNEE REPLACEMENT POSTOPERATIVE DIRECTIONS  Knee Rehabilitation, Guidelines Following Surgery  Results after knee surgery are often greatly improved when you follow the exercise, range of motion and muscle strengthening exercises prescribed by your doctor. Safety measures are also important to protect the knee from further injury. Any time any of these exercises cause you to have increased pain or swelling in your knee joint, decrease the amount until you are comfortable again and slowly increase them. If you have problems or questions, call your caregiver or physical therapist for advice.   HOME CARE INSTRUCTIONS  Remove items at home which could result in a fall. This includes throw rugs or furniture in walking pathways.   ICE to the affected knee every three hours for 30 minutes at a time and then as needed for pain and swelling.  Continue to use ice on the knee for pain and swelling from surgery. You may notice swelling that will progress down to the foot and ankle.  This is normal after surgery.  Elevate the leg when you are not up walking on it.    Continue to use the breathing machine which will help keep your temperature down.  It is common for your temperature to cycle up and down following surgery, especially at night when you are not up moving around and exerting yourself.  The breathing machine keeps your lungs expanded and your temperature down.  Do not place pillow under knee, focus on keeping the knee straight while resting  DIET You may resume your previous home diet once your are discharged from the hospital.  DRESSING / WOUND CARE / SHOWERING You may change your dressing 3-5 days after surgery.  Then change the dressing every day with sterile gauze.  Please use good hand washing techniques before changing the dressing.  Do not use any  lotions or creams on the incision until instructed by your surgeon. You may start showering once you are discharged home but do not submerge the incision under water. Just pat the incision dry and apply a dry gauze dressing on daily. Change the surgical dressing daily and reapply a dry dressing each time.  ACTIVITY Walk with your walker as instructed. Use walker as long as suggested by your caregivers. Avoid periods of inactivity such as sitting longer than an hour when not asleep. This helps prevent blood clots.  You may resume a sexual relationship in one month or when given the OK by your doctor.  You may return to work once you are cleared by your doctor.  Do not drive a car for 6 weeks or until released by you surgeon.  Do not drive while taking narcotics.  WEIGHT BEARING Weight bearing as tolerated with assist device (walker, cane, etc) as directed, use it as long as suggested by your surgeon or therapist, typically at least 4-6 weeks.  POSTOPERATIVE CONSTIPATION PROTOCOL Constipation - defined medically as fewer than three stools per week and severe constipation as less than one stool per week.  One of the most common issues patients have following surgery is constipation.  Even if you have a regular bowel pattern at home, your normal regimen is likely to be disrupted due to multiple reasons following surgery.  Combination of anesthesia, postoperative narcotics, change in appetite and fluid intake all can affect your bowels.  In order to avoid complications following surgery, here are some recommendations  in order to help you during your recovery period.  Colace (docusate) - Pick up an over-the-counter form of Colace or another stool softener and take twice a day as long as you are requiring postoperative pain medications.  Take with a full glass of water daily.  If you experience loose stools or diarrhea, hold the colace until you stool forms back up.  If your symptoms do not get better  within 1 week or if they get worse, check with your doctor.  Dulcolax (bisacodyl) - Pick up over-the-counter and take as directed by the product packaging as needed to assist with the movement of your bowels.  Take with a full glass of water.  Use this product as needed if not relieved by Colace only.   MiraLax (polyethylene glycol) - Pick up over-the-counter to have on hand.  MiraLax is a solution that will increase the amount of water in your bowels to assist with bowel movements.  Take as directed and can mix with a glass of water, juice, soda, coffee, or tea.  Take if you go more than two days without a movement. Do not use MiraLax more than once per day. Call your doctor if you are still constipated or irregular after using this medication for 7 days in a row.  If you continue to have problems with postoperative constipation, please contact the office for further assistance and recommendations.  If you experience "the worst abdominal pain ever" or develop nausea or vomiting, please contact the office immediatly for further recommendations for treatment.  ITCHING  If you experience itching with your medications, try taking only a single pain pill, or even half a pain pill at a time.  You can also use Benadryl over the counter for itching or also to help with sleep.   TED HOSE STOCKINGS Wear the elastic stockings on both legs for three weeks following surgery during the day but you may remove then at night for sleeping.  MEDICATIONS See your medication summary on the After Visit Summary that the nursing staff will review with you prior to discharge.  You may have some home medications which will be placed on hold until you complete the course of blood thinner medication.  It is important for you to complete the blood thinner medication as prescribed by your surgeon.  Continue your approved medications as instructed at time of discharge.  PRECAUTIONS If you experience chest pain or shortness  of breath - call 911 immediately for transfer to the hospital emergency department.  If you develop a fever greater that 101 F, purulent drainage from wound, increased redness or drainage from wound, foul odor from the wound/dressing, or calf pain - CONTACT YOUR SURGEON.                                                   FOLLOW-UP APPOINTMENTS Make sure you keep all of your appointments after your operation with your surgeon and caregivers. You should call the office at the above phone number and make an appointment for approximately two weeks after the date of your surgery or on the date instructed by your surgeon outlined in the "After Visit Summary".   RANGE OF MOTION AND STRENGTHENING EXERCISES  Rehabilitation of the knee is important following a knee injury or an operation. After just a few days of immobilization, the muscles  of the thigh which control the knee become weakened and shrink (atrophy). Knee exercises are designed to build up the tone and strength of the thigh muscles and to improve knee motion. Often times heat used for twenty to thirty minutes before working out will loosen up your tissues and help with improving the range of motion but do not use heat for the first two weeks following surgery. These exercises can be done on a training (exercise) mat, on the floor, on a table or on a bed. Use what ever works the best and is most comfortable for you Knee exercises include:  Leg Lifts - While your knee is still immobilized in a splint or cast, you can do straight leg raises. Lift the leg to 60 degrees, hold for 3 sec, and slowly lower the leg. Repeat 10-20 times 2-3 times daily. Perform this exercise against resistance later as your knee gets better.  Quad and Hamstring Sets - Tighten up the muscle on the front of the thigh (Quad) and hold for 5-10 sec. Repeat this 10-20 times hourly. Hamstring sets are done by pushing the foot backward against an object and holding for 5-10 sec. Repeat as  with quad sets.   Leg Slides: Lying on your back, slowly slide your foot toward your buttocks, bending your knee up off the floor (only go as far as is comfortable). Then slowly slide your foot back down until your leg is flat on the floor again.  Angel Wings: Lying on your back spread your legs to the side as far apart as you can without causing discomfort.  A rehabilitation program following serious knee injuries can speed recovery and prevent re-injury in the future due to weakened muscles. Contact your doctor or a physical therapist for more information on knee rehabilitation.   IF YOU ARE TRANSFERRED TO A SKILLED REHAB FACILITY If the patient is transferred to a skilled rehab facility following release from the hospital, a list of the current medications will be sent to the facility for the patient to continue.  When discharged from the skilled rehab facility, please have the facility set up the patient's [place] prior to being released. Also, the skilled facility will be responsible for providing the patient with their medications at time of release from the facility to include their pain medication, the muscle relaxants, and their blood thinner medication. If the patient is still at the rehab facility at time of the two week follow up appointment, the skilled rehab facility will also need to assist the patient in arranging follow up appointment in our office and any transportation needs.  MAKE SURE YOU:  Understand these instructions.  Get help right away if you are not doing well or get worse.    Pick up stool softner and laxative for home use following surgery while on pain medications. Do not submerge incision under water. Please use good hand washing techniques while changing dressing each day. May shower starting three days after surgery. Please use a clean towel to pat the incision dry following showers. Continue to use ice for pain and swelling after  surgery. Do not use any lotions or creams on the incision until instructed by your surgeon.   Information on my medicine - XARELTO (Rivaroxaban)  This medication education was reviewed with me or my healthcare representative as part of my discharge preparation.  Why was Xarelto prescribed for you? Xarelto was prescribed for you to reduce the risk of blood clots forming after  orthopedic surgery. The medical term for these abnormal blood clots is venous thromboembolism (VTE).  What do you need to know about xarelto ? Take your Xarelto ONCE DAILY at the same time every day. You may take it either with or without food.  If you have difficulty swallowing the tablet whole, you may crush it and mix in applesauce just prior to taking your dose.  Take Xarelto exactly as prescribed by your doctor and DO NOT stop taking Xarelto without talking to the doctor who prescribed the medication.  Stopping without other VTE prevention medication to take the place of Xarelto may increase your risk of developing a clot.  After discharge, you should have regular check-up appointments with your healthcare provider that is prescribing your Xarelto.    What do you do if you miss a dose? If you miss a dose, take it as soon as you remember on the same day then continue your regularly scheduled once daily regimen the next day. Do not take two doses of Xarelto on the same day.   Important Safety Information A possible side effect of Xarelto is bleeding. You should call your healthcare provider right away if you experience any of the following: ? Bleeding from an injury or your nose that does not stop. ? Unusual colored urine (red or dark brown) or unusual colored stools (red or black). ? Unusual bruising for unknown reasons. ? A serious fall or if you hit your head (even if there is no bleeding).  Some medicines may interact with Xarelto and might increase your risk of bleeding while on Xarelto. To help  avoid this, consult your healthcare provider or pharmacist prior to using any new prescription or non-prescription medications, including herbals, vitamins, non-steroidal anti-inflammatory drugs (NSAIDs) and supplements.  This website has more information on Xarelto: [email or web address]

## 2015-01-10 NOTE — Evaluation (Signed)
Occupational Therapy Evaluation and Discharge Summary Patient Details Name: HEDDA CRUMBLEY MRN: [id number] DOB: 1941/08/22 Today's Date: 01/10/2015    History of Present Illness s/p RTKA   Clinical Impression   Pt admitted for the above diagnosis and overall is doing very well with adls only requiring min assist with LE adls.  Daughter to assist pt after d/c and all adl education complete.  Pt has all necessary equipment.    Follow Up Recommendations  No OT follow up;Supervision - Intermittent    Equipment Recommendations  None recommended by OT    Recommendations for Other Services       Precautions / Restrictions Precautions Precautions: Knee Required Braces or Orthoses: Knee Immobilizer - Right Knee Immobilizer - Right: Discontinue once straight leg raise with < 10 degree lag Restrictions Weight Bearing Restrictions: No      Mobility Bed Mobility Overal bed mobility: Needs Assistance Bed Mobility: Supine to Sit     Supine to sit: Min assist     General bed mobility comments: assisted LE   Transfers Overall transfer level: Needs assistance Equipment used: Rolling walker (2 wheeled) Transfers: Sit to/from Stand Sit to Stand: Supervision         General transfer comment: cues for hand placement and to kick out foot when sitting.    Balance Overall balance assessment: Needs assistance         Standing balance support: Bilateral upper extremity supported;During functional activity Standing balance-Leahy Scale: Fair Standing balance comment: Pt able to let go of walker for short amounts of time but could not take challenges.                            ADL Overall ADL's : Needs assistance/impaired Eating/Feeding: Independent   Grooming: Supervision/safety;Standing   Upper Body Bathing: Set up;Sitting   Lower Body Bathing: Minimal assistance;Sit to/from stand   Upper Body Dressing : Set up;Sitting   Lower Body Dressing: Minimal  assistance;Sit to/from stand Lower Body Dressing Details (indicate cue type and reason): assist with socks and shoes.  Pt has reacher and long shoe horn at home.  Discussed sock aid with her if she cannot donn socks by time her daughter leaves. Toilet Transfer: Min guard;Grab bars;Comfort height toilet;Ambulation;RW Toilet Transfer Details (indicate cue type and reason): Pt walked to bathroom and toileted with min guard only for transfers. Toileting- Clothing Manipulation and Hygiene: Supervision/safety;Sit to/from stand   Tub/ Shower Transfer: Walk-in shower;Minimal assistance;Ambulation;Grab bars;Rolling walker Tub/Shower Transfer Details (indicate cue type and reason): instructed on safest technique for home Functional mobility during ADLs: Min guard;Rolling walker General ADL Comments: Pt did very well with all adls. Education complete and family there 24/7 for about a week.     Vision     Perception     Praxis      Pertinent Vitals/Pain Pain Assessment: 0-10 Pain Score: 4  Pain Location: R knee Pain Descriptors / Indicators: Aching Pain Intervention(s): Monitored during session;Repositioned;Ice applied     Hand Dominance Right   Extremity/Trunk Assessment Upper Extremity Assessment Upper Extremity Assessment: Overall WFL for tasks assessed   Lower Extremity Assessment Lower Extremity Assessment: Defer to PT evaluation   Cervical / Trunk Assessment Cervical / Trunk Assessment: Normal   Communication Communication Communication: No difficulties   Cognition Arousal/Alertness: Awake/alert Behavior During Therapy: WFL for tasks assessed/performed Overall Cognitive Status: Within Functional Limits for tasks assessed  General Comments       Exercises       Shoulder Instructions      Home Living Family/patient expects to be discharged to:: Private residence Living Arrangements: Children Available Help at Discharge: Family Type of  Home: House Home Access: Stairs to enter Technical brewer of Steps: 1 +1 Entrance Stairs-Rails: None Home Layout: Laundry or work area in basement     [employer] Shower/Tub: Gaffer;Door   [employer] Toilet: Standard     Home Equipment: Environmental consultant - 4 wheels;Bedside commode;Toilet riser;Shower seat   Additional Comments: Pt has 16 +2 steps to basement where she will eventually need to access to get to laundry and freezer.      Prior Functioning/Environment Level of Independence: Independent with assistive device(s)        Comments: used cane    OT Diagnosis: Generalized weakness   OT Problem List:     OT Treatment/Interventions:      OT Goals(Current goals can be found in the care plan section) Acute Rehab OT Goals Patient Stated Goal: to be able to walk agian without so much pain and giving way OT Goal Formulation: All assessment and education complete, DC therapy  OT Frequency:     Barriers to D/C:            Co-evaluation              End of Session Equipment Utilized During Treatment: Rolling walker;Right knee immobilizer CPM Right Knee CPM Right Knee: Off Nurse Communication: Mobility status  Activity Tolerance: Patient tolerated treatment well Patient left: in chair;with call bell/phone within reach;with family/visitor present   Time: 6144-3154 OT Time Calculation (min): 43 min Charges:  OT General Charges $OT Visit: 1 Procedure OT Evaluation $Initial OT Evaluation Tier I: 1 Procedure OT Treatments $Self Care/Home Management : 8-22 mins G-Codes:    Glenford Peers Feb 07, 2015, 9:34 AM  [phone]

## 2015-01-10 NOTE — Progress Notes (Signed)
Physical Therapy Treatment Patient Details Name: Bonnie Gray MRN: [id number] DOB: [birth year] Today's Date: 01/10/2015    History of Present Illness s/p RTKA    PT Comments    POD # 1 am session.  Pt OOB in recliner.  Applied KI and instructed pt on use.  Assisted with amb in hallway.  Returned to room to perform TKR TE's followed by ICE.  Follow Up Recommendations  Home health PT     Equipment Recommendations  Rolling walker with 5" wheels    Recommendations for Other Services       Precautions / Restrictions Precautions Precautions: Knee;Fall Precaution Comments: instructed pt and daughter on KI use for amb Required Braces or Orthoses: Knee Immobilizer - Right Knee Immobilizer - Right: Discontinue once straight leg raise with < 10 degree lag Restrictions Weight Bearing Restrictions: No    Mobility  Bed Mobility               General bed mobility comments: Pt OOB in recliner  Transfers Overall transfer level: Needs assistance Equipment used: Rolling walker (2 wheeled) Transfers: Sit to/from Stand Sit to Stand: Supervision;Min guard         General transfer comment: 50% VC's on proper hand placement  Ambulation/Gait Ambulation/Gait assistance: Min assist Ambulation Distance (Feet): 55 Feet Assistive device: Rolling walker (2 wheeled) Gait Pattern/deviations: Step-to pattern Gait velocity: decreased   General Gait Details: cues for sequencing and proper walker to self distance   Stairs            Wheelchair Mobility    Modified Rankin (Stroke Patients Only)       Balance                                    Cognition Arousal/Alertness: Awake/alert Behavior During Therapy: WFL for tasks assessed/performed Overall Cognitive Status: Within Functional Limits for tasks assessed                      Exercises   Total Knee Replacement TE's 10 reps B LE ankle pumps 10 reps towel squeezes 10 reps knee  presses 10 reps heel slides  10 reps SAQ's 10 reps SLR's 10 reps ABD Followed by ICE     General Comments        Pertinent Vitals/Pain Pain Assessment: 0-10 Pain Score: 6  Pain Location: R knee Pain Descriptors / Indicators: Aching;Sore;Tender Pain Intervention(s): Monitored during session;Premedicated before session;Repositioned;Ice applied    Home Living                      Prior Function            PT Goals (current goals can now be found in the care plan section) Progress towards PT goals: Progressing toward goals    Frequency  7X/week    PT Plan      Co-evaluation             End of Session Equipment Utilized During Treatment: Gait belt;Right knee immobilizer Activity Tolerance: Patient tolerated treatment well Patient left: in chair;with family/visitor present     Time: 8756-4332 PT Time Calculation (min) (ACUTE ONLY): 28 min  Charges:  $Gait Training: 8-22 mins $Therapeutic Exercise: 8-22 mins                    G Codes:      Rica Koyanagi  PTA [facility]  Acute  Rehab Pager      [phone]

## 2015-01-11 LAB — BASIC METABOLIC PANEL
Anion gap: 7 (ref 5–15)
BUN: 9 mg/dL (ref 6–23)
CO2: 28 mmol/L (ref 19–32)
Calcium: 8.6 mg/dL (ref 8.4–10.5)
Chloride: 102 mmol/L (ref 96–112)
Creatinine, Ser: 0.5 mg/dL (ref 0.50–1.10)
GFR calc Af Amer: >90 mL/min (ref >90)
Glucose, Bld: 144 mg/dL — ABNORMAL HIGH (ref 70–99)
POTASSIUM: 3.7 mmol/L (ref 3.5–5.1)
SODIUM: 137 mmol/L (ref 135–145)

## 2015-01-11 LAB — CBC
HCT: 32.7 % — ABNORMAL LOW (ref 36.0–46.0)
HEMOGLOBIN: 10.8 g/dL — AB (ref 12.0–15.0)
MCH: 29.3 pg (ref 26.0–34.0)
MCHC: 33 g/dL (ref 30.0–36.0)
MCV: 88.9 fL (ref 78.0–100.0)
Platelets: 227 10*3/uL (ref 150–400)
RBC: 3.68 MIL/uL — ABNORMAL LOW (ref 3.87–5.11)
RDW: 13.2 % (ref 11.5–15.5)
WBC: 19.4 10*3/uL — ABNORMAL HIGH (ref 4.0–10.5)

## 2015-01-11 NOTE — Progress Notes (Signed)
Physical Therapy Treatment Patient Details Name: Bonnie Gray MRN: [id number] DOB: [birth year] Today's Date: 01/11/2015    History of Present Illness s/p RTKA    PT Comments    POD #2; Pt was in recliner; Ambulated 8ft; up and down 1 stair; back to bed and performed exercises; Hands on education family with home exercises; exercise copy given to pt and family member present;  Follow Up Recommendations  Home health PT     Equipment Recommendations  Rolling walker with 5" wheels    Recommendations for Other Services       Precautions / Restrictions Precautions Precautions: Knee;Fall Precaution Comments: Pt. able to perfomr straight leg raises no longer needs immoblizer  Restrictions Weight Bearing Restrictions: No    Mobility  Bed Mobility Overal bed mobility: Modified Independent Bed Mobility: Sit to Supine       Sit to supine: Modified independent (Device/Increase time);Min assist   General bed mobility comments: Pt recliner to walker to bed.   Transfers Overall transfer level: Needs assistance Equipment used: Rolling walker (2 wheeled) Transfers: Sit to/from Stand Sit to Stand: Supervision;Min guard            Ambulation/Gait Ambulation/Gait assistance: Supervision Ambulation Distance (Feet): 75 Feet Assistive device: Rolling walker (2 wheeled) Gait Pattern/deviations: Step-to pattern         Stairs Stairs: Yes Stairs assistance: Supervision Stair Management: Step to pattern;With walker;Forwards Number of Stairs: 1 General stair comments: Cues required for step to and sequence of leg and walker going up and down  Wheelchair Mobility    Modified Rankin (Stroke Patients Only)       Balance                                    Cognition Arousal/Alertness: Awake/alert Behavior During Therapy: WFL for tasks assessed/performed Overall Cognitive Status: Within Functional Limits for tasks assessed                       Exercises Total Joint Exercises Ankle Circles/Pumps: Right;Supine;AROM;20 reps Quad Sets: AROM;Right;Supine;10 reps Towel Squeeze: AROM;10 reps;Supine;Right Short Arc Quad: AROM;Supine;10 reps;Right Heel Slides: AROM;Supine;10 reps;Right Hip ABduction/ADduction: AROM;Supine;10 reps;Right Straight Leg Raises: AROM;10 reps;Supine;Right    General Comments        Pertinent Vitals/Pain Pain Assessment: 0-10 Pain Score: 4  Pain Location: R Knee Pain Intervention(s): Repositioned;Ice applied;Monitored during session    Home Living                      Prior Function            PT Goals (current goals can now be found in the care plan section) Progress towards PT goals: Progressing toward goals    Frequency  7X/week    PT Plan      Co-evaluation             End of Session Equipment Utilized During Treatment: Gait belt Activity Tolerance: Patient tolerated treatment well Patient left: in bed;with call bell/phone within reach;with family/visitor present     Time: 2778-2423 PT Time Calculation (min) (ACUTE ONLY): 38 min  Charges:  $Gait Training: 8-22 mins $Therapeutic Exercise: 8-22 mins $Therapeutic Activity: 8-22 mins                    G Codes:      Bonnie Gray,[place] Student, PTA  01/11/2015, 3:53 PM  Reviewed and agree with above  Rica Koyanagi  PTA [facility]  Acute  Rehab Pager      [phone]

## 2015-01-11 NOTE — Progress Notes (Signed)
UR complete 

## 2015-01-11 NOTE — Plan of Care (Signed)
Problem: Phase III Progression Outcomes Goal: Anticoagulant follow-up in place Outcome: Not Applicable Date Met:  79/81/02 Xarelto VTE, no f/u needed.

## 2015-01-12 NOTE — Progress Notes (Signed)
   LATE ENTRY NOTE Date of Service of Visit - 01/11/2015 Date of Discharge  Subjective: 2 Days Post-Op Procedure(s) (LRB): RIGHT TOTAL KNEE ARTHROPLASTY (Right) KNEE INJECTION (Left) Patient reports pain as mild.   Patient seen in rounds for Dr. Wynelle Link. Patient is well, and has had no acute complaints or problems Patient is ready to go home  Objective: Vital signs in last 24 hours: Temp 97.8 Pulse 66 Resp 16 O2% 92%  Intake/Output from previous day:  Intake/Output Summary (Last 24 hours) at 01/12/15 0729 Last data filed at 01/11/15 0839  Gross per 24 hour  Intake    240 ml  Output      0 ml  Net    240 ml    Intake/Output this shift: Total I/O In: 2229.7 [P.O.:2160; I.V.:69.7] Out: 2925 [Urine:2925]  Labs:  Recent Labs  01/10/15 0441 01/11/15 0435  HGB 11.4* 10.8*    Recent Labs  01/10/15 0441 01/11/15 0435  WBC 18.3* 19.4*  RBC 3.87 3.68*  HCT 34.6* 32.7*  PLT 241 227    Recent Labs  01/10/15 0441 01/11/15 0435  NA 136 137  K 4.0 3.7  CL 101 102  CO2 27 28  BUN 8 9  CREATININE 0.61 0.50  GLUCOSE 177* 144*  CALCIUM 8.1* 8.6   No results for input(s): LABPT, INR in the last 72 hours.  EXAM: General - Patient is Alert, Appropriate and Oriented Extremity - Neurovascular intact Sensation intact distally Incision - clean, dry, no drainage, healing Motor Function - intact, moving foot and toes well on exam.   Assessment/Plan: 2 Days Post-Op Procedure(s) (LRB): RIGHT TOTAL KNEE ARTHROPLASTY (Right) KNEE INJECTION (Left) Procedure(s) (LRB): RIGHT TOTAL KNEE ARTHROPLASTY (Right) KNEE INJECTION (Left) Past Medical History  Diagnosis Date  . Hypertension   . Osteoarthritis     knees  . Diarrhea   . Diverticulosis   . Anxiety   . Anemia     as a child  . Cataract     right  . Right bundle branch block   . History of blood transfusion   . Shortness of breath dyspnea     "because of the weight"/01/02/15- states unchanged   Principal  Problem:   OA (osteoarthritis) of knee  Estimated body mass index is 33.61 kg/(m^2) as calculated from the following:   Height as of this encounter: 5\' 5"  (1.651 m).   Weight as of this encounter: 91.627 kg (202 lb). Up with therapy Discharge home with home health Diet - Cardiac diet Follow up - in 2 weeks Activity - WBAT Disposition - Home Condition Upon Discharge - Good D/C Meds - See DC Summary DVT Prophylaxis - Xarelto  [name], PA-C Orthopaedic Surgery 01/12/2015, 7:29 AM

## 2015-02-14 ENCOUNTER — Ambulatory Visit: Payer: Self-pay | Admitting: Orthopedic Surgery

## 2015-03-13 ENCOUNTER — Encounter (HOSPITAL_COMMUNITY): Admission: RE | Payer: Self-pay | Source: Ambulatory Visit

## 2015-03-13 ENCOUNTER — Inpatient Hospital Stay (HOSPITAL_COMMUNITY)
Admission: RE | Admit: 2015-03-13 | Payer: BLUE CROSS/BLUE SHIELD | Source: Ambulatory Visit | Admitting: Orthopedic Surgery

## 2015-03-13 SURGERY — ARTHROPLASTY, KNEE, TOTAL
Anesthesia: Choice | Site: Knee | Laterality: Left

## 2015-06-13 ENCOUNTER — Ambulatory Visit: Payer: Self-pay | Admitting: Orthopedic Surgery

## 2015-06-13 NOTE — Progress Notes (Signed)
Preoperative surgical orders have been place into the Epic hospital system for Bonnie Gray on 06/13/2015, 7:37 AM  by Mickel Crow for surgery on 06-26-2015.  Preop Total Knee orders including Experal, IV Tylenol, and IV Decadron as long as there are no contraindications to the above medications. [name], PA-C

## 2015-06-20 ENCOUNTER — Encounter (HOSPITAL_COMMUNITY): Payer: Self-pay

## 2015-06-20 ENCOUNTER — Encounter (HOSPITAL_COMMUNITY)
Admission: RE | Admit: 2015-06-20 | Discharge: 2015-06-20 | Disposition: A | Discharge status: Home / Self Care | Payer: Medicare Other | Source: Ambulatory Visit | Attending: Orthopedic Surgery | Admitting: Orthopedic Surgery

## 2015-06-20 DIAGNOSIS — M179 Osteoarthritis of knee, unspecified: Secondary | ICD-10-CM | POA: Insufficient documentation

## 2015-06-20 DIAGNOSIS — Z01818 Encounter for other preprocedural examination: Secondary | ICD-10-CM | POA: Diagnosis not present

## 2015-06-20 HISTORY — DX: Asymptomatic menopausal state: Z78.0

## 2015-06-20 HISTORY — DX: Presence of dental prosthetic device (complete) (partial): Z97.2

## 2015-06-20 HISTORY — DX: Headache, unspecified: R51.9

## 2015-06-20 HISTORY — DX: Unspecified visual loss: H54.7

## 2015-06-20 HISTORY — DX: Gastro-esophageal reflux disease without esophagitis: K21.9

## 2015-06-20 HISTORY — DX: Unspecified hemorrhoids: K64.9

## 2015-06-20 HISTORY — DX: Other chronic pain: G89.29

## 2015-06-20 HISTORY — DX: Unspecified macular degeneration: H35.30

## 2015-06-20 HISTORY — DX: Diverticulitis of intestine, part unspecified, without perforation or abscess without bleeding: K57.92

## 2015-06-20 HISTORY — DX: Personal history of other infectious and parasitic diseases: Z86.19

## 2015-06-20 HISTORY — DX: Headache: R51

## 2015-06-20 HISTORY — DX: Personal history of urinary (tract) infections: Z87.440

## 2015-06-20 LAB — COMPREHENSIVE METABOLIC PANEL
ALBUMIN: 4.2 g/dL (ref 3.5–5.0)
ALK PHOS: 44 U/L (ref 38–126)
ALT: 18 U/L (ref 14–54)
AST: 25 U/L (ref 15–41)
Anion gap: 9 (ref 5–15)
BILIRUBIN TOTAL: 1.2 mg/dL (ref 0.3–1.2)
BUN: 9 mg/dL (ref 6–20)
CO2: 31 mmol/L (ref 22–32)
Calcium: 9.4 mg/dL (ref 8.9–10.3)
Chloride: 98 mmol/L — ABNORMAL LOW (ref 101–111)
Creatinine, Ser: 0.64 mg/dL (ref 0.44–1.00)
GFR calc Af Amer: >60 mL/min (ref >60)
GFR calc non Af Amer: >60 mL/min (ref >60)
GLUCOSE: 104 mg/dL — AB (ref 65–99)
POTASSIUM: 3.7 mmol/L (ref 3.5–5.1)
Sodium: 138 mmol/L (ref 135–145)
TOTAL PROTEIN: 7.1 g/dL (ref 6.5–8.1)

## 2015-06-20 LAB — CBC
HEMATOCRIT: 39.2 % (ref 36.0–46.0)
HEMOGLOBIN: 12.8 g/dL (ref 12.0–15.0)
MCH: 29.3 pg (ref 26.0–34.0)
MCHC: 32.7 g/dL (ref 30.0–36.0)
MCV: 89.7 fL (ref 78.0–100.0)
Platelets: 240 10*3/uL (ref 150–400)
RBC: 4.37 MIL/uL (ref 3.87–5.11)
RDW: 13.8 % (ref 11.5–15.5)
WBC: 8.6 10*3/uL (ref 4.0–10.5)

## 2015-06-20 LAB — URINALYSIS, ROUTINE W REFLEX MICROSCOPIC
BILIRUBIN URINE: NEGATIVE
Glucose, UA: NEGATIVE mg/dL
Hgb urine dipstick: NEGATIVE
KETONES UR: NEGATIVE mg/dL
Leukocytes, UA: NEGATIVE
NITRITE: NEGATIVE
PROTEIN: NEGATIVE mg/dL
SPECIFIC GRAVITY, URINE: 1.007 (ref 1.005–1.030)
UROBILINOGEN UA: 0.2 mg/dL (ref 0.0–1.0)
pH: 7 (ref 5.0–8.0)

## 2015-06-20 LAB — SURGICAL PCR SCREEN
MRSA, PCR: NEGATIVE
STAPHYLOCOCCUS AUREUS: NEGATIVE

## 2015-06-20 LAB — PROTIME-INR
INR: 1.01 (ref 0.00–1.49)
Prothrombin Time: 13.5 seconds (ref 11.6–15.2)

## 2015-06-20 LAB — APTT: APTT: 30 s (ref 24–37)

## 2015-06-20 NOTE — Patient Instructions (Addendum)
JILENE SPOHR  06/20/2015   Your procedure is scheduled on: Monday June 26, 2015   Report to [facility]  Entrance take [place]  elevators to 3rd floor to  [place] at 8:30 AM.  Call this number if you have problems the morning of surgery [phone]   Remember: ONLY 1 PERSON MAY GO WITH YOU TO SHORT STAY TO GET  READY MORNING OF [place].  Do not eat food or drink liquids :After Midnight.     Take these medicines the morning of surgery with A SIP OF WATER: Diazepam (Valium) if needed; Loratidine (Claritin) in needed;  Systane eye drops if needed                               You may not have any metal on your body including hair pins and              piercings  Do not wear jewelry, make-up, lotions, powders or perfumes, deodorant             Do not wear nail polish.  Do not shave  48 hours prior to surgery.                Do not bring valuables to the hospital. [place].  Contacts, dentures or bridgework may not be worn into surgery.  Leave suitcase in the car. After surgery it may be brought to your room.                Please read over the following fact sheets you were given:MRSA INFORMATION SHEET; BLOOD TRANSFUSION INFORMATION SHEET; INCENTIVE SPIROMETER  _____________________________________________________________________             [facility] - Preparing for Surgery Before surgery, you can play an important role.  Because skin is not sterile, your skin needs to be as free of germs as possible.  You can reduce the number of germs on your skin by washing with CHG (chlorahexidine gluconate) soap before surgery.  CHG is an antiseptic cleaner which kills germs and bonds with the skin to continue killing germs even after washing. Please DO NOT use if you have an allergy to CHG or antibacterial soaps.  If your skin becomes reddened/irritated stop using the CHG and inform your nurse  when you arrive at Short Stay. Do not shave (including legs and underarms) for at least 48 hours prior to the first CHG shower.  You may shave your face/neck. Please follow these instructions carefully:  1.  Shower with CHG Soap the night before surgery and the  morning of Surgery.  2.  If you choose to wash your hair, wash your hair first as usual with your  normal  shampoo.  3.  After you shampoo, rinse your hair and body thoroughly to remove the  shampoo.                           4.  Use CHG as you would any other liquid soap.  You can apply chg directly  to the skin and wash  Gently with a scrungie or clean washcloth.  5.  Apply the CHG Soap to your body ONLY FROM THE NECK DOWN.   Do not use on face/ open                           Wound or open sores. Avoid contact with eyes, ears mouth and genitals (private parts).                       Wash face,  Genitals (private parts) with your normal soap.             6.  Wash thoroughly, paying special attention to the area where your surgery  will be performed.  7.  Thoroughly rinse your body with warm water from the neck down.  8.  DO NOT shower/wash with your normal soap after using and rinsing off  the CHG Soap.                9.  Pat yourself dry with a clean towel.            10.  Wear clean pajamas.            11.  Place clean sheets on your bed the night of your first shower and do not  sleep with pets. Day of Surgery : Do not apply any lotions/deodorants the morning of surgery.  Please wear clean clothes to the hospital/surgery center.  FAILURE TO FOLLOW THESE INSTRUCTIONS MAY RESULT IN THE CANCELLATION OF YOUR SURGERY PATIENT SIGNATURE_________________________________  NURSE SIGNATURE__________________________________  ________________________________________________________________________   [name]  An incentive spirometer is a tool that can help keep your lungs clear and active. This tool  measures how well you are filling your lungs with each breath. Taking long deep breaths may help reverse or decrease the chance of developing breathing (pulmonary) problems (especially infection) following:  A long period of time when you are unable to move or be active. BEFORE THE PROCEDURE   If the spirometer includes an indicator to show your best effort, your nurse or respiratory therapist will set it to a desired goal.  If possible, sit up straight or lean slightly forward. Try not to slouch.  Hold the incentive spirometer in an upright position. INSTRUCTIONS FOR USE   Sit on the edge of your bed if possible, or sit up as far as you can in bed or on a chair.  Hold the incentive spirometer in an upright position.  Breathe out normally.  Place the mouthpiece in your mouth and seal your lips tightly around it.  Breathe in slowly and as deeply as possible, raising the piston or the ball toward the top of the column.  Hold your breath for 3-5 seconds or for as long as possible. Allow the piston or ball to fall to the bottom of the column.  Remove the mouthpiece from your mouth and breathe out normally.  Rest for a few seconds and repeat Steps 1 through 7 at least 10 times every 1-2 hours when you are awake. Take your time and take a few normal breaths between deep breaths.  The spirometer may include an indicator to show your best effort. Use the indicator as a goal to work toward during each repetition.  After each set of 10 deep breaths, practice coughing to be sure your lungs are clear. If you have an incision (the cut made at the time of surgery),  support your incision when coughing by placing a pillow or rolled up towels firmly against it. Once you are able to get out of bed, walk around indoors and cough well. You may stop using the incentive spirometer when instructed by your caregiver.  RISKS AND COMPLICATIONS  Take your time so you do not get dizzy or light-headed.  If  you are in pain, you may need to take or ask for pain medication before doing incentive spirometry. It is harder to take a deep breath if you are having pain. AFTER USE  Rest and breathe slowly and easily.  It can be helpful to keep track of a log of your progress. Your caregiver can provide you with a simple table to help with this. If you are using the spirometer at home, follow these instructions: [place] IF:   You are having difficultly using the spirometer.  You have trouble using the spirometer as often as instructed.  Your pain medication is not giving enough relief while using the spirometer.  You develop fever of 100.5 F (38.1 C) or higher. SEEK IMMEDIATE MEDICAL CARE IF:   You cough up bloody sputum that had not been present before.  You develop fever of 102 F (38.9 C) or greater.  You develop worsening pain at or near the incision site. MAKE SURE YOU:   Understand these instructions.  Will watch your condition.  Will get help right away if you are not doing well or get worse. Document Released: 02/03/2007 Document Revised: 12/16/2011 Document Reviewed: 04/06/2007 ExitCare Patient Information 2014 ExitCare, [place].   ________________________________________________________________________  WHAT IS A BLOOD TRANSFUSION? Blood Transfusion Information  A transfusion is the replacement of blood or some of its parts. Blood is made up of multiple cells which provide different functions.  Red blood cells carry oxygen and are used for blood loss replacement.  White blood cells fight against infection.  Platelets control bleeding.  Plasma helps clot blood.  Other blood products are available for specialized needs, such as hemophilia or other clotting disorders. BEFORE THE TRANSFUSION  Who gives blood for transfusions?   Healthy volunteers who are fully evaluated to make sure their blood is safe. This is blood bank blood. Transfusion therapy is the  safest it has ever been in the practice of medicine. Before blood is taken from a donor, a complete history is taken to make sure that person has no history of diseases nor engages in risky social behavior (examples are intravenous drug use or sexual activity with multiple partners). The donor's travel history is screened to minimize risk of transmitting infections, such as malaria. The donated blood is tested for signs of infectious diseases, such as HIV and hepatitis. The blood is then tested to be sure it is compatible with you in order to minimize the chance of a transfusion reaction. If you or a relative donates blood, this is often done in anticipation of surgery and is not appropriate for emergency situations. It takes many days to process the donated blood. RISKS AND COMPLICATIONS Although transfusion therapy is very safe and saves many lives, the main dangers of transfusion include:   Getting an infectious disease.  Developing a transfusion reaction. This is an allergic reaction to something in the blood you were given. Every precaution is taken to prevent this. The decision to have a blood transfusion has been considered carefully by your caregiver before blood is given. Blood is not given unless the benefits outweigh the risks. AFTER THE TRANSFUSION  Right after receiving a blood transfusion, you will usually feel much better and more energetic. This is especially true if your red blood cells have gotten low (anemic). The transfusion raises the level of the red blood cells which carry oxygen, and this usually causes an energy increase.  The nurse administering the transfusion will monitor you carefully for complications. HOME CARE INSTRUCTIONS  No special instructions are needed after a transfusion. You may find your energy is better. Speak with your caregiver about any limitations on activity for underlying diseases you may have. SEEK MEDICAL CARE IF:   Your condition is not improving  after your transfusion.  You develop redness or irritation at the intravenous (IV) site. SEEK IMMEDIATE MEDICAL CARE IF:  Any of the following symptoms occur over the next 12 hours:  Shaking chills.  You have a temperature by mouth above 102 F (38.9 C), not controlled by medicine.  Chest, back, or muscle pain.  People around you feel you are not acting correctly or are confused.  Shortness of breath or difficulty breathing.  Dizziness and fainting.  You get a rash or develop hives.  You have a decrease in urine output.  Your urine turns a dark color or changes to pink, red, or brown. Any of the following symptoms occur over the next 10 days:  You have a temperature by mouth above 102 F (38.9 C), not controlled by medicine.  Shortness of breath.  Weakness after normal activity.  The white part of the eye turns yellow (jaundice).  You have a decrease in the amount of urine or are urinating less often.  Your urine turns a dark color or changes to pink, red, or brown. Document Released: 09/20/2000 Document Revised: 12/16/2011 Document Reviewed: 05/09/2008 [facility] Patient Information 2014 [place], [place].  _______________________________________________________________________

## 2015-06-20 NOTE — Progress Notes (Addendum)
Clearance note per chart per Dr Romilda Garret 05/15/2015  EKG per chart 05/16/2015 also EKG / epic 01/09/2015 Stress test epic 11/24/2014 ECHO epic 11/24/2014

## 2015-06-25 ENCOUNTER — Ambulatory Visit: Payer: Self-pay | Admitting: Orthopedic Surgery

## 2015-06-25 NOTE — H&P (Signed)
Bonnie Gray DOB: 04-18-1941 Divorced / Language: English / Race: White Female Date of Admission:  06/26/2015 CC:  Left Knee Pain History of Present Illness The patient is a 74 year old female who comes in for a preoperative History and Physical. The patient is scheduled for a left total knee arthroplasty to be performed by Dr. Dione Plover. Aluisio, MD at [facility] on 06/26/2015. The patient is a 74 year old female presenting for both knees. The patient comes in several months out from right total knee arthroplasty. The patient states that she is doing fair at this time. Pain control at this time and describe their pain as mild. They are currently on Ultram for their pain. The patient is currently doing home exercise program. Her left knee causes her more pain than the right. She feels as though the right knee is doing extremely well at this time. She feels like she has made excellent progress with the right and is ready to go ahead and get the left done. She is subsequentlt admitted for the left total knee replacement at this time. They have been treated conservatively in the past for the above stated problem and despite conservative measures, they continue to have progressive pain and severe functional limitations and dysfunction. They have failed non-operative management including home exercise, medications, and injections. It is felt that they would benefit from undergoing total joint replacement. Risks and benefits of the procedure have been discussed with the patient and they elect to proceed with surgery. There are no active contraindications to surgery such as ongoing infection or rapidly progressive neurological disease.  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) Abnormal EKG (R94.31) Anxiety Disorder Diverticulitis Of Colon Chronic Pain High blood pressure Gastroesophageal Reflux  Disease Migraine Headache Osteoarthritis Macular Degeneration Impaired Vision Cataract Right Eye Dentures Right Bundle Branch Block Hemorrhoids Diverticulosis Urinary Tract Infection Degenerative Disc Disease Measles Mumps Rubella Menopause  Allergies  Caduet *CARDIOVASCULAR AGENTS - MISC.* CeleBREX *ANALGESICS - ANTI-INFLAMMATORY* AmLODIPine Besylate *CALCIUM CHANNEL BLOCKERS* Exforge *ANTIHYPERTENSIVES* OxyCODONE HCl *ANALGESICS - OPIOID* Hallucinations  Family History  Congestive Heart Failure child Depression child Cancer child Diabetes Mellitus Father. First Degree Relatives reported Osteoarthritis Mother. child Cerebrovascular Accident Mother. Heart Disease Father, Maternal Grandfather, Mother, Paternal Grandfather. child  Social History Current drinker 08/26/2014: Currently drinks beer and wine only occasionally per week Children 2 Current work status retired Living situation live alone Exercise Exercises weekly; does running / walking and other Tobacco use Never smoker. 08/26/2014 No history of drug/alcohol rehab Marital status divorced Not under pain contract Tobacco / smoke exposure 08/26/2014: yes Number of flights of stairs before winded 2-3 [place] POA  Medication History  TraMADol HCl (50MG  Tablet, Oral) Active. Gaviscon (Oral) Specific dose unknown - Active. Diazepam (2MG  Tablet, 1/2 -1 Oral daily) Active. Loratadine (10MG  Tablet, Oral daily) Active. Lisinopril-Hydrochlorothiazide (20-12.5MG  Tablet, Oral two times daily) Active. Meloxicam (15MG  Tablet, Oral) Active. Claritin (10MG  Tablet, Oral) Active. Centrum Silver (Oral) Active. Acetaminophen ER (650MG  Tablet ER, Oral) Active. Aspirin (81MG  Tablet Chewable, Oral) Active. Zofran (4MG  Tablet, Oral) Active. PreserVision AREDS (Oral) Active. Coricidin HBP Congestion/Cough (10-200MG  Capsule, Oral) Active.   Past  Surgical History  Sinus Surgery Date: 49. Hysterectomy Date: 57. complete (non-cancerous) Gallbladder Surgery Date: 1995. laporoscopic Arthroscopy of Knee Date: 2009. right Appendectomy Date: 51. Cataract Surgery Date: 2010. left Foot Surgery Date: 65. left Dilation and Curettage of Uterus Total Knee Replacement - Right Date: 01/2015.  Review of Systems General Present- Weight Gain. Not Present- Chills, Fatigue, Fever, Memory Loss, Night Sweats and Weight Loss. Skin Not Present- Eczema, Hives, Itching, Lesions and Rash. HEENT Present- Blurred Vision, Dentures and Headache. Not Present- Double Vision, Hearing Loss, Tinnitus and Visual Loss. Respiratory Present- Cough, Shortness of breath at rest and Shortness of breath with exertion. Not Present- Allergies, Chronic Cough and Coughing up blood. Cardiovascular Present- Difficulty Breathing Lying Down (usually sleeps elevated on two pillows). Not Present- Chest Pain, Murmur, Palpitations, Racing/skipping heartbeats and Swelling. Gastrointestinal Present- Diarrhea. Not Present- Abdominal Pain, Bloody Stool, Constipation, Difficulty Swallowing, Heartburn, Jaundice, Loss of appetitie, Nausea and Vomiting. Female Genitourinary Present- Urinary frequency and Urinating at Night. Not Present- Blood in Urine, Discharge, Flank Pain, Incontinence, Painful Urination, Urgency, Urinary Retention and Weak urinary stream. Musculoskeletal Present- Back Pain, Joint Pain, Joint Swelling, Morning Stiffness, Muscle Pain, Muscle Weakness and Spasms. Neurological Not Present- Blackout spells, Difficulty with balance, Dizziness, Paralysis, Tremor and Weakness. Psychiatric Not Present- Insomnia.  Vitals Weight: 205 lb Height: 65in Weight was reported by patient. Height was reported by patient. Body Surface Area: 2 m Body Mass Index: 34.11 kg/m  BP: 190/110 (Sitting, Right Arm, Standard) Patient states that she has "white coat  syndrome".  Physical Exam General Mental Status -Alert, cooperative and good historian. General Appearance-pleasant, Not in acute distress. Orientation-Oriented X3. Build & Nutrition-Well nourished and Well developed.  Head and Neck Head-normocephalic, atraumatic . Neck Global Assessment - supple, no bruit auscultated on the right, no bruit auscultated on the left. Note: upper partial denture plate   Eye Pupil - Bilateral-Regular and Round. Motion - Bilateral-EOMI.  Chest and Lung Exam Auscultation Breath sounds - clear at anterior chest wall and clear at posterior chest wall. Adventitious sounds - No Adventitious sounds.  Cardiovascular Auscultation Rhythm - Regular rate and rhythm. Heart Sounds - S1 WNL and S2 WNL. Murmurs & Other Heart Sounds - Auscultation of the heart reveals - No Murmurs.  Abdomen Palpation/Percussion Tenderness - Abdomen is non-tender to palpation. Rigidity (guarding) - Abdomen is soft. Auscultation Auscultation of the abdomen reveals - Bowel sounds normal.  Female Genitourinary Note: Not done, not pertinent to present illness   Musculoskeletal Note: On exam, she is alert and oriented, in no apparent distress. Her right knee looks excellent. No swelling. Range of motion about 5 to 120. There is no tenderness or instability. Left knee varus deformity, no effusion, range 5 to 120, marked crepitus on range of motion, tender medial greater than lateral, no instability noted.   Assessment & Plan Primary osteoarthritis of left knee (M17.12) Status post total right knee replacement (O70.962) Note:Surgical Plans: Left Total Knee Replacement  Disposition: Home  PCP: Dr. Hampton Abbot - Patient has been seen preoperatively and felt to be stable for surgery.  IV TXA  Anesthesia Issues: None  Signed electronically by Joelene Millin, III PA-C

## 2015-06-26 ENCOUNTER — Inpatient Hospital Stay (HOSPITAL_COMMUNITY): Payer: Medicare Other | Admitting: Anesthesiology

## 2015-06-26 ENCOUNTER — Encounter (HOSPITAL_COMMUNITY)
Admission: RE | Disposition: A | Discharge status: Home Health | Payer: Self-pay | Source: Ambulatory Visit | Attending: Orthopedic Surgery

## 2015-06-26 ENCOUNTER — Inpatient Hospital Stay (HOSPITAL_COMMUNITY)
Admission: RE | Admit: 2015-06-26 | Discharge: 2015-06-28 | DRG: 470 | Disposition: A | Discharge status: Home Health | Payer: Medicare Other | Source: Ambulatory Visit | Attending: Orthopedic Surgery | Admitting: Orthopedic Surgery

## 2015-06-26 ENCOUNTER — Encounter (HOSPITAL_COMMUNITY): Payer: Self-pay | Admitting: *Deleted

## 2015-06-26 DIAGNOSIS — G8929 Other chronic pain: Secondary | ICD-10-CM | POA: Diagnosis present

## 2015-06-26 DIAGNOSIS — M179 Osteoarthritis of knee, unspecified: Secondary | ICD-10-CM | POA: Diagnosis present

## 2015-06-26 DIAGNOSIS — Z79899 Other long term (current) drug therapy: Secondary | ICD-10-CM

## 2015-06-26 DIAGNOSIS — Z6834 Body mass index (BMI) 34.0-34.9, adult: Secondary | ICD-10-CM

## 2015-06-26 DIAGNOSIS — Z7982 Long term (current) use of aspirin: Secondary | ICD-10-CM | POA: Diagnosis not present

## 2015-06-26 DIAGNOSIS — H3530 Unspecified macular degeneration: Secondary | ICD-10-CM | POA: Diagnosis present

## 2015-06-26 DIAGNOSIS — M1712 Unilateral primary osteoarthritis, left knee: Secondary | ICD-10-CM | POA: Diagnosis present

## 2015-06-26 DIAGNOSIS — K219 Gastro-esophageal reflux disease without esophagitis: Secondary | ICD-10-CM | POA: Diagnosis present

## 2015-06-26 DIAGNOSIS — F419 Anxiety disorder, unspecified: Secondary | ICD-10-CM | POA: Diagnosis present

## 2015-06-26 DIAGNOSIS — I4510 Unspecified right bundle-branch block: Secondary | ICD-10-CM | POA: Diagnosis present

## 2015-06-26 DIAGNOSIS — E669 Obesity, unspecified: Secondary | ICD-10-CM | POA: Diagnosis present

## 2015-06-26 DIAGNOSIS — I10 Essential (primary) hypertension: Secondary | ICD-10-CM | POA: Diagnosis present

## 2015-06-26 DIAGNOSIS — Z96651 Presence of right artificial knee joint: Secondary | ICD-10-CM | POA: Diagnosis present

## 2015-06-26 DIAGNOSIS — Z01812 Encounter for preprocedural laboratory examination: Secondary | ICD-10-CM

## 2015-06-26 DIAGNOSIS — M25562 Pain in left knee: Secondary | ICD-10-CM | POA: Diagnosis present

## 2015-06-26 DIAGNOSIS — M1710 Unilateral primary osteoarthritis, unspecified knee: Secondary | ICD-10-CM | POA: Diagnosis present

## 2015-06-26 HISTORY — PX: TOTAL KNEE ARTHROPLASTY: SHX125

## 2015-06-26 LAB — TYPE AND SCREEN
ABO/RH(D): A POS
ANTIBODY SCREEN: NEGATIVE

## 2015-06-26 SURGERY — ARTHROPLASTY, KNEE, TOTAL
Anesthesia: Spinal | Site: Knee | Laterality: Left

## 2015-06-26 MED ORDER — CEFAZOLIN SODIUM-DEXTROSE 2-3 GM-% IV SOLR
2.0000 g | Freq: Four times a day (QID) | INTRAVENOUS | Status: AC
  Administered 2015-06-26 – 2015-06-27 (×2): 2 g via INTRAVENOUS
  Filled 2015-06-26 (×2): qty 50

## 2015-06-26 MED ORDER — POTASSIUM CHLORIDE IN NACL 20-0.9 MEQ/L-% IV SOLN
INTRAVENOUS | Status: DC
  Administered 2015-06-26 – 2015-06-27 (×2): via INTRAVENOUS
  Filled 2015-06-26 (×2): qty 1000

## 2015-06-26 MED ORDER — DOCUSATE SODIUM 100 MG PO CAPS
100.0000 mg | ORAL_CAPSULE | Freq: Two times a day (BID) | ORAL | Status: DC
  Administered 2015-06-26 – 2015-06-28 (×4): 100 mg via ORAL

## 2015-06-26 MED ORDER — ACETAMINOPHEN 325 MG PO TABS
650.0000 mg | ORAL_TABLET | Freq: Four times a day (QID) | ORAL | Status: DC | PRN
  Filled 2015-06-26: qty 2

## 2015-06-26 MED ORDER — DEXAMETHASONE SODIUM PHOSPHATE 10 MG/ML IJ SOLN
10.0000 mg | Freq: Once | INTRAMUSCULAR | Status: AC
  Administered 2015-06-26: 10 mg via INTRAVENOUS

## 2015-06-26 MED ORDER — HYDROMORPHONE HCL 2 MG PO TABS
2.0000 mg | ORAL_TABLET | ORAL | Status: DC | PRN
Start: 2015-06-26 — End: 2015-06-28
  Administered 2015-06-26 – 2015-06-27 (×3): 2 mg via ORAL
  Administered 2015-06-27 – 2015-06-28 (×4): 4 mg via ORAL
  Filled 2015-06-26: qty 1
  Filled 2015-06-26 (×2): qty 2
  Filled 2015-06-26 (×2): qty 1
  Filled 2015-06-26 (×2): qty 2

## 2015-06-26 MED ORDER — LACTATED RINGERS IV SOLN
INTRAVENOUS | Status: DC | PRN
  Administered 2015-06-26 (×2): via INTRAVENOUS

## 2015-06-26 MED ORDER — RIVAROXABAN 10 MG PO TABS
10.0000 mg | ORAL_TABLET | Freq: Every day | ORAL | Status: DC
  Administered 2015-06-27 – 2015-06-28 (×2): 10 mg via ORAL
  Filled 2015-06-26 (×4): qty 1

## 2015-06-26 MED ORDER — ACETAMINOPHEN 500 MG PO TABS
1000.0000 mg | ORAL_TABLET | Freq: Four times a day (QID) | ORAL | Status: AC
  Administered 2015-06-26 – 2015-06-27 (×4): 1000 mg via ORAL
  Filled 2015-06-26 (×4): qty 2

## 2015-06-26 MED ORDER — MENTHOL 3 MG MT LOZG: 1.0000 | LOZENGE | OROMUCOSAL | Status: DC | PRN

## 2015-06-26 MED ORDER — PHENYLEPHRINE HCL 10 MG/ML IJ SOLN
10.0000 mg | INTRAVENOUS | Status: DC | PRN
  Administered 2015-06-26: 15 ug/min via INTRAVENOUS

## 2015-06-26 MED ORDER — FENTANYL CITRATE (PF) 100 MCG/2ML IJ SOLN
INTRAMUSCULAR | Status: AC
  Filled 2015-06-26: qty 4

## 2015-06-26 MED ORDER — ONDANSETRON HCL 4 MG/2ML IJ SOLN
INTRAMUSCULAR | Status: AC
  Filled 2015-06-26: qty 2

## 2015-06-26 MED ORDER — TRAMADOL HCL 50 MG PO TABS
50.0000 mg | ORAL_TABLET | Freq: Four times a day (QID) | ORAL | Status: DC | PRN
  Administered 2015-06-26 – 2015-06-27 (×2): 100 mg via ORAL
  Filled 2015-06-26 (×2): qty 2

## 2015-06-26 MED ORDER — BUPIVACAINE LIPOSOME 1.3 % IJ SUSP
INTRAMUSCULAR | Status: DC | PRN
  Administered 2015-06-26: 20 mL

## 2015-06-26 MED ORDER — MIDAZOLAM HCL 2 MG/2ML IJ SOLN
INTRAMUSCULAR | Status: AC
  Filled 2015-06-26: qty 4

## 2015-06-26 MED ORDER — PHENOL 1.4 % MT LIQD: 1.0000 | OROMUCOSAL | Status: DC | PRN

## 2015-06-26 MED ORDER — PROPOFOL 10 MG/ML IV BOLUS
INTRAVENOUS | Status: AC
  Filled 2015-06-26: qty 20

## 2015-06-26 MED ORDER — CHLORHEXIDINE GLUCONATE 4 % EX LIQD: 60.0000 mL | Freq: Once | CUTANEOUS | Status: DC

## 2015-06-26 MED ORDER — SODIUM CHLORIDE 0.9 % IJ SOLN
INTRAMUSCULAR | Status: AC
  Filled 2015-06-26: qty 50

## 2015-06-26 MED ORDER — HYDROMORPHONE HCL 1 MG/ML IJ SOLN: 0.2500 mg | INTRAMUSCULAR | Status: DC | PRN

## 2015-06-26 MED ORDER — PROPOFOL INFUSION 10 MG/ML OPTIME
INTRAVENOUS | Status: DC | PRN
  Administered 2015-06-26: 70 ug/kg/min via INTRAVENOUS

## 2015-06-26 MED ORDER — BUPIVACAINE IN DEXTROSE 0.75-8.25 % IT SOLN: INTRATHECAL | Status: DC | PRN

## 2015-06-26 MED ORDER — METOCLOPRAMIDE HCL 5 MG/ML IJ SOLN: 5.0000 mg | Freq: Three times a day (TID) | INTRAMUSCULAR | Status: DC | PRN

## 2015-06-26 MED ORDER — ONDANSETRON HCL 4 MG PO TABS
4.0000 mg | ORAL_TABLET | Freq: Four times a day (QID) | ORAL | Status: DC | PRN
  Filled 2015-06-26: qty 1

## 2015-06-26 MED ORDER — FLUTICASONE PROPIONATE 50 MCG/ACT NA SUSP
1.0000 | Freq: Every day | NASAL | Status: DC | PRN
  Filled 2015-06-26: qty 16

## 2015-06-26 MED ORDER — GUAIFENESIN-CODEINE 100-10 MG/5ML PO SOLN
15.0000 mL | Freq: Two times a day (BID) | ORAL | Status: DC | PRN
  Filled 2015-06-26: qty 15

## 2015-06-26 MED ORDER — METHOCARBAMOL 500 MG PO TABS
500.0000 mg | ORAL_TABLET | Freq: Four times a day (QID) | ORAL | Status: DC | PRN
  Administered 2015-06-27: 500 mg via ORAL
  Filled 2015-06-26 (×2): qty 1

## 2015-06-26 MED ORDER — DIAZEPAM 2 MG PO TABS: 2.0000 mg | ORAL_TABLET | Freq: Two times a day (BID) | ORAL | Status: DC | PRN

## 2015-06-26 MED ORDER — SODIUM CHLORIDE 0.9 % IV SOLN: INTRAVENOUS | Status: DC

## 2015-06-26 MED ORDER — FENTANYL CITRATE (PF) 100 MCG/2ML IJ SOLN
INTRAMUSCULAR | Status: DC | PRN
  Administered 2015-06-26 (×2): 50 ug via INTRAVENOUS

## 2015-06-26 MED ORDER — FLEET ENEMA 7-19 GM/118ML RE ENEM: 1.0000 | ENEMA | Freq: Once | RECTAL | Status: DC | PRN

## 2015-06-26 MED ORDER — METOCLOPRAMIDE HCL 10 MG PO TABS: 5.0000 mg | ORAL_TABLET | Freq: Three times a day (TID) | ORAL | Status: DC | PRN

## 2015-06-26 MED ORDER — LORATADINE 10 MG PO TABS
10.0000 mg | ORAL_TABLET | Freq: Every morning | ORAL | Status: DC
  Administered 2015-06-27 – 2015-06-28 (×2): 10 mg via ORAL
  Filled 2015-06-26 (×2): qty 1

## 2015-06-26 MED ORDER — ONDANSETRON HCL 4 MG/2ML IJ SOLN
4.0000 mg | Freq: Four times a day (QID) | INTRAMUSCULAR | Status: DC | PRN
  Filled 2015-06-26: qty 2

## 2015-06-26 MED ORDER — NEBIVOLOL HCL 5 MG PO TABS
5.0000 mg | ORAL_TABLET | Freq: Every day | ORAL | Status: DC
  Administered 2015-06-26 – 2015-06-27 (×2): 5 mg via ORAL
  Filled 2015-06-26 (×3): qty 1

## 2015-06-26 MED ORDER — ONDANSETRON HCL 4 MG/2ML IJ SOLN
INTRAMUSCULAR | Status: DC | PRN
  Administered 2015-06-26: 4 mg via INTRAVENOUS

## 2015-06-26 MED ORDER — HYDROMORPHONE HCL 1 MG/ML IJ SOLN
0.5000 mg | INTRAMUSCULAR | Status: DC | PRN
  Administered 2015-06-26: 0.5 mg via INTRAVENOUS
  Filled 2015-06-26: qty 1

## 2015-06-26 MED ORDER — METHOCARBAMOL 1000 MG/10ML IJ SOLN
500.0000 mg | Freq: Four times a day (QID) | INTRAVENOUS | Status: DC | PRN
  Administered 2015-06-26: 500 mg via INTRAVENOUS
  Filled 2015-06-26 (×2): qty 5

## 2015-06-26 MED ORDER — LACTATED RINGERS IV SOLN: INTRAVENOUS | Status: DC

## 2015-06-26 MED ORDER — POLYETHYLENE GLYCOL 3350 17 G PO PACK: 17.0000 g | PACK | Freq: Every day | ORAL | Status: DC | PRN

## 2015-06-26 MED ORDER — DIPHENHYDRAMINE HCL 12.5 MG/5ML PO ELIX: 12.5000 mg | ORAL_SOLUTION | ORAL | Status: DC | PRN

## 2015-06-26 MED ORDER — CEFAZOLIN SODIUM-DEXTROSE 2-3 GM-% IV SOLR
2.0000 g | INTRAVENOUS | Status: AC
  Administered 2015-06-26: 2 g via INTRAVENOUS

## 2015-06-26 MED ORDER — BUPIVACAINE LIPOSOME 1.3 % IJ SUSP
20.0000 mL | Freq: Once | INTRAMUSCULAR | Status: DC
  Filled 2015-06-26: qty 20

## 2015-06-26 MED ORDER — BISACODYL 10 MG RE SUPP
10.0000 mg | Freq: Every day | RECTAL | Status: DC | PRN
  Administered 2015-06-28: 10 mg via RECTAL
  Filled 2015-06-26: qty 1

## 2015-06-26 MED ORDER — BUPIVACAINE HCL 0.25 % IJ SOLN
INTRAMUSCULAR | Status: DC | PRN
  Administered 2015-06-26: 20 mL

## 2015-06-26 MED ORDER — SODIUM CHLORIDE 0.9 % IJ SOLN
INTRAMUSCULAR | Status: DC | PRN
  Administered 2015-06-26: 30 mL

## 2015-06-26 MED ORDER — ACETAMINOPHEN 650 MG RE SUPP: 650.0000 mg | Freq: Four times a day (QID) | RECTAL | Status: DC | PRN

## 2015-06-26 MED ORDER — DEXAMETHASONE SODIUM PHOSPHATE 10 MG/ML IJ SOLN
INTRAMUSCULAR | Status: AC
  Filled 2015-06-26: qty 1

## 2015-06-26 MED ORDER — BUPIVACAINE HCL (PF) 0.75 % IJ SOLN
INTRAMUSCULAR | Status: DC | PRN
  Administered 2015-06-26: 15 mg via INTRATHECAL

## 2015-06-26 MED ORDER — TRANEXAMIC ACID 1000 MG/10ML IV SOLN
1000.0000 mg | INTRAVENOUS | Status: AC
  Administered 2015-06-26: 1000 mg via INTRAVENOUS
  Filled 2015-06-26: qty 10

## 2015-06-26 MED ORDER — BUPIVACAINE HCL (PF) 0.25 % IJ SOLN
INTRAMUSCULAR | Status: AC
  Filled 2015-06-26: qty 30

## 2015-06-26 MED ORDER — ACETAMINOPHEN 10 MG/ML IV SOLN
1000.0000 mg | Freq: Once | INTRAVENOUS | Status: AC
  Administered 2015-06-26: 1000 mg via INTRAVENOUS
  Filled 2015-06-26: qty 100

## 2015-06-26 MED ORDER — MIDAZOLAM HCL 5 MG/5ML IJ SOLN
INTRAMUSCULAR | Status: DC | PRN
  Administered 2015-06-26: 2 mg via INTRAVENOUS

## 2015-06-26 SURGICAL SUPPLY — 61 items
BAG DECANTER FOR FLEXI CONT (MISCELLANEOUS) IMPLANT
BAG ZIPLOCK 12X15 (MISCELLANEOUS) ×2 IMPLANT
BANDAGE ELASTIC 6 VELCRO ST LF (GAUZE/BANDAGES/DRESSINGS) ×2 IMPLANT
BANDAGE ESMARK 6X9 LF (GAUZE/BANDAGES/DRESSINGS) ×1 IMPLANT
BLADE SAG 18X100X1.27 (BLADE) ×2 IMPLANT
BLADE SAW SGTL 11.0X1.19X90.0M (BLADE) ×2 IMPLANT
BNDG ESMARK 6X9 LF (GAUZE/BANDAGES/DRESSINGS) ×2
BOWL SMART MIX CTS (DISPOSABLE) ×2 IMPLANT
CAP KNEE TOTAL 3 SIGMA ×2 IMPLANT
CEMENT HV SMART SET (Cement) ×4 IMPLANT
CUFF TOURN SGL QUICK 34 (TOURNIQUET CUFF) ×1
CUFF TRNQT CYL 34X4X40X1 (TOURNIQUET CUFF) ×1 IMPLANT
DECANTER SPIKE VIAL GLASS SM (MISCELLANEOUS) ×2 IMPLANT
DRAPE EXTREMITY T 121X128X90 (DRAPE) ×2 IMPLANT
DRAPE POUCH INSTRU U-SHP 10X18 (DRAPES) ×2 IMPLANT
DRAPE U-SHAPE 47X51 STRL (DRAPES) ×2 IMPLANT
DRSG ADAPTIC 3X8 NADH LF (GAUZE/BANDAGES/DRESSINGS) ×2 IMPLANT
DRSG PAD ABDOMINAL 8X10 ST (GAUZE/BANDAGES/DRESSINGS) ×2 IMPLANT
DURAPREP 26ML APPLICATOR (WOUND CARE) ×2 IMPLANT
ELECT REM PT RETURN 9FT ADLT (ELECTROSURGICAL) ×2
ELECTRODE REM PT RTRN 9FT ADLT (ELECTROSURGICAL) ×1 IMPLANT
EVACUATOR 1/8 PVC DRAIN (DRAIN) ×2 IMPLANT
FACESHIELD WRAPAROUND (MASK) ×10 IMPLANT
GAUZE SPONGE 4X4 12PLY STRL (GAUZE/BANDAGES/DRESSINGS) ×2 IMPLANT
GLOVE BIO SURGEON STRL SZ7.5 (GLOVE) ×2 IMPLANT
GLOVE BIO SURGEON STRL SZ8 (GLOVE) ×2 IMPLANT
GLOVE BIOGEL PI IND STRL 6.5 (GLOVE) ×1 IMPLANT
GLOVE BIOGEL PI IND STRL 8 (GLOVE) ×1 IMPLANT
GLOVE BIOGEL PI INDICATOR 6.5 (GLOVE) ×1
GLOVE BIOGEL PI INDICATOR 8 (GLOVE) ×1
GLOVE SURG SS PI 6.5 STRL IVOR (GLOVE) ×2 IMPLANT
GOWN STRL REUS W/TWL LRG LVL3 (GOWN DISPOSABLE) ×4 IMPLANT
GOWN STRL REUS W/TWL XL LVL3 (GOWN DISPOSABLE) ×2 IMPLANT
HANDPIECE INTERPULSE COAX TIP (DISPOSABLE) ×1
IMMOBILIZER KNEE 20 (SOFTGOODS) ×2
IMMOBILIZER KNEE 20 THIGH 36 (SOFTGOODS) ×1 IMPLANT
KIT BASIN OR (CUSTOM PROCEDURE TRAY) ×2 IMPLANT
MANIFOLD NEPTUNE II (INSTRUMENTS) ×2 IMPLANT
NDL SAFETY ECLIPSE 18X1.5 (NEEDLE) ×2 IMPLANT
NEEDLE HYPO 18GX1.5 SHARP (NEEDLE) ×2
NS IRRIG 1000ML POUR BTL (IV SOLUTION) ×2 IMPLANT
PACK TOTAL JOINT (CUSTOM PROCEDURE TRAY) ×2 IMPLANT
PADDING CAST COTTON 6X4 STRL (CAST SUPPLIES) ×2 IMPLANT
PEN SKIN MARKING BROAD (MISCELLANEOUS) ×2 IMPLANT
POSITIONER SURGICAL ARM (MISCELLANEOUS) ×2 IMPLANT
SET HNDPC FAN SPRY TIP SCT (DISPOSABLE) ×1 IMPLANT
STRIP CLOSURE SKIN 1/2X4 (GAUZE/BANDAGES/DRESSINGS) ×2 IMPLANT
SUCTION FRAZIER 12FR DISP (SUCTIONS) ×2 IMPLANT
SUT MNCRL AB 4-0 PS2 18 (SUTURE) ×2 IMPLANT
SUT VIC AB 2-0 CT1 27 (SUTURE) ×3
SUT VIC AB 2-0 CT1 TAPERPNT 27 (SUTURE) ×3 IMPLANT
SUT VLOC 180 0 24IN GS25 (SUTURE) ×2 IMPLANT
SYR 20CC LL (SYRINGE) ×2 IMPLANT
SYR 50ML LL SCALE MARK (SYRINGE) ×2 IMPLANT
TOWEL OR 17X26 10 PK STRL BLUE (TOWEL DISPOSABLE) ×2 IMPLANT
TOWEL OR NON WOVEN STRL DISP B (DISPOSABLE) ×2 IMPLANT
TRAY FOLEY W/METER SILVER 14FR (SET/KITS/TRAYS/PACK) ×2 IMPLANT
TRAY FOLEY W/METER SILVER 16FR (SET/KITS/TRAYS/PACK) IMPLANT
WATER STERILE IRR 1500ML POUR (IV SOLUTION) ×2 IMPLANT
WRAP KNEE MAXI GEL POST OP (GAUZE/BANDAGES/DRESSINGS) ×2 IMPLANT
YANKAUER SUCT BULB TIP 10FT TU (MISCELLANEOUS) ×2 IMPLANT

## 2015-06-26 NOTE — Op Note (Signed)
Pre-operative diagnosis- Osteoarthritis  Left knee(s)  Post-operative diagnosis- Osteoarthritis Left knee(s)  Procedure-  Left  Total Knee Arthroplasty  Surgeon- Dione Plover. Aluisio, MD  Assistant- Ardeen Jourdain, PA-C   Anesthesia-  Spinal  EBL-* No blood loss amount entered *   Drains Hemovac  Tourniquet time-  Total Tourniquet Time Documented: Thigh (Left) - 29 minutes Total: Thigh (Left) - 29 minutes     Complications- None  Condition-PACU - hemodynamically stable.   Brief Clinical Note  Bonnie Gray is a 74 y.o. 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 She has constant pain, with activity and at rest and significant functional deficits with difficulties even with ADLs. She has had extensive non-op management including analgesics, injections of cortisone and viscosupplements, and home exercise program, but remains in significant pain with significant dysfunction. Radiographs show bone on bone arthritis patellofemoral with patellar erosion. She presents now for left Total Knee Arthroplasty.    Procedure in detail---   The patient is brought into the operating room and positioned supine on the operating table. After successful administration of  Spinal,   a tourniquet is placed high on the  Left thigh(s) and the lower extremity is prepped and draped in the usual sterile fashion. Time out is performed by the operating team and then the  Left lower extremity is wrapped in Esmarch, knee flexed and the tourniquet inflated to 300 mmHg.       A midline incision is made with a ten blade through the subcutaneous tissue to the level of the extensor mechanism. A fresh blade is used to make a medial parapatellar arthrotomy. Soft tissue over the proximal medial tibia is subperiosteally elevated to the joint line with a knife and into the semimembranosus bursa with a Cobb elevator. Soft tissue over the proximal lateral tibia is  elevated with attention being paid to avoiding the patellar tendon on the tibial tubercle. The patella is everted, knee flexed 90 degrees and the ACL and PCL are removed. Findings are bone on bone lateral and patellofemoral with large global osteophytes.        The drill is used to create a starting hole in the distal femur and the canal is thoroughly irrigated with sterile saline to remove the fatty contents. The 5 degree Left  valgus alignment guide is placed into the femoral canal and the distal femoral cutting block is pinned to remove 10 mm off the distal femur. Resection is made with an oscillating saw.      The tibia is subluxed forward and the menisci are removed. The extramedullary alignment guide is placed referencing proximally at the medial aspect of the tibial tubercle and distally along the second metatarsal axis and tibial crest. The block is pinned to remove 91mm off the more deficient medial  side. Resection is made with an oscillating saw. Size 3is the most appropriate size for the tibia and the proximal tibia is prepared with the modular drill and keel punch for that size.      The femoral sizing guide is placed and size 3 is most appropriate. Rotation is marked off the epicondylar axis and confirmed by creating a rectangular flexion gap at 90 degrees. The size 3 cutting block is pinned in this rotation and the anterior, posterior and chamfer cuts are made with the oscillating saw. The intercondylar block is then placed and that cut is made.      Trial size 3 tibial  component, trial size 3 posterior stabilized femur and a 12.5  mm posterior stabilized rotating platform insert trial is placed. Full extension is achieved with excellent varus/valgus and anterior/posterior balance throughout full range of motion. The patella is everted and thickness measured to be 22  mm. Free hand resection is taken to 12 mm, a 35 template is placed, lug holes are drilled, trial patella is placed, and it tracks  normally. Osteophytes are removed off the posterior femur with the trial in place. All trials are removed and the cut bone surfaces prepared with pulsatile lavage. Cement is mixed and once ready for implantation, the size 3 tibial implant, size  3 posterior stabilized femoral component, and the size 35 patella are cemented in place and the patella is held with the clamp. The trial insert is placed and the knee held in full extension. The Exparel (20 ml mixed with 30 ml saline) and .25% Bupivicaine, are injected into the extensor mechanism, posterior capsule, medial and lateral gutters and subcutaneous tissues.  All extruded cement is removed and once the cement is hard the permanent 12.5 mm posterior stabilized rotating platform insert is placed into the tibial tray.      The wound is copiously irrigated with saline solution and the extensor mechanism closed over a hemovac drain with #1 V-loc suture. The tourniquet is released for a total tourniquet time of 29  minutes. Flexion against gravity is 140 degrees and the patella tracks normally. Subcutaneous tissue is closed with 2.0 vicryl and subcuticular with running 4.0 Monocryl. The incision is cleaned and dried and steri-strips and a bulky sterile dressing are applied. The limb is placed into a knee immobilizer and the patient is awakened and transported to recovery in stable condition.      Please note that a surgical assistant was a medical necessity for this procedure in order to perform it in a safe and expeditious manner. Surgical assistant was necessary to retract the ligaments and vital neurovascular structures to prevent injury to them and also necessary for proper positioning of the limb to allow for anatomic placement of the prosthesis.   Dione Plover Aluisio, MD    06/26/2015, 12:57 PM

## 2015-06-26 NOTE — H&P (View-Only) (Signed)
Bonnie Gray DOB: 03-May-1941 Divorced / Language: English / Race: White Female Date of Admission:  06/26/2015 CC:  Left Knee Pain History of Present Illness The patient is a 74 year old female who comes in for a preoperative History and Physical. The patient is scheduled for a left total knee arthroplasty to be performed by Dr. Dione Plover. Aluisio, MD at [facility] on 06/26/2015. The patient is a 74 year old female presenting for both knees. The patient comes in several months out from right total knee arthroplasty. The patient states that she is doing fair at this time. Pain control at this time and describe their pain as mild. They are currently on Ultram for their pain. The patient is currently doing home exercise program. Her left knee causes her more pain than the right. She feels as though the right knee is doing extremely well at this time. She feels like she has made excellent progress with the right and is ready to go ahead and get the left done. She is subsequentlt admitted for the left total knee replacement at this time. They have been treated conservatively in the past for the above stated problem and despite conservative measures, they continue to have progressive pain and severe functional limitations and dysfunction. They have failed non-operative management including home exercise, medications, and injections. It is felt that they would benefit from undergoing total joint replacement. Risks and benefits of the procedure have been discussed with the patient and they elect to proceed with surgery. There are no active contraindications to surgery such as ongoing infection or rapidly progressive neurological disease.  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) Abnormal EKG (R94.31) Anxiety Disorder Diverticulitis Of Colon Chronic Pain High blood pressure Gastroesophageal Reflux  Disease Migraine Headache Osteoarthritis Macular Degeneration Impaired Vision Cataract Right Eye Dentures Right Bundle Branch Block Hemorrhoids Diverticulosis Urinary Tract Infection Degenerative Disc Disease Measles Mumps Rubella Menopause  Allergies  Caduet *CARDIOVASCULAR AGENTS - MISC.* CeleBREX *ANALGESICS - ANTI-INFLAMMATORY* AmLODIPine Besylate *CALCIUM CHANNEL BLOCKERS* Exforge *ANTIHYPERTENSIVES* OxyCODONE HCl *ANALGESICS - OPIOID* Hallucinations  Family History  Congestive Heart Failure child Depression child Cancer child Diabetes Mellitus Father. First Degree Relatives reported Osteoarthritis Mother. child Cerebrovascular Accident Mother. Heart Disease Father, Maternal Grandfather, Mother, Paternal Grandfather. child  Social History Current drinker 08/26/2014: Currently drinks beer and wine only occasionally per week Children 2 Current work status retired Living situation live alone Exercise Exercises weekly; does running / walking and other Tobacco use Never smoker. 08/26/2014 No history of drug/alcohol rehab Marital status divorced Not under pain contract Tobacco / smoke exposure 08/26/2014: yes Number of flights of stairs before winded 2-3 [place] POA  Medication History  TraMADol HCl (50MG  Tablet, Oral) Active. Gaviscon (Oral) Specific dose unknown - Active. Diazepam (2MG  Tablet, 1/2 -1 Oral daily) Active. Loratadine (10MG  Tablet, Oral daily) Active. Lisinopril-Hydrochlorothiazide (20-12.5MG  Tablet, Oral two times daily) Active. Meloxicam (15MG  Tablet, Oral) Active. Claritin (10MG  Tablet, Oral) Active. Centrum Silver (Oral) Active. Acetaminophen ER (650MG  Tablet ER, Oral) Active. Aspirin (81MG  Tablet Chewable, Oral) Active. Zofran (4MG  Tablet, Oral) Active. PreserVision AREDS (Oral) Active. Coricidin HBP Congestion/Cough (10-200MG  Capsule, Oral) Active.   Past  Surgical History  Sinus Surgery Date: 56. Hysterectomy Date: 32. complete (non-cancerous) Gallbladder Surgery Date: 1995. laporoscopic Arthroscopy of Knee Date: 2009. right Appendectomy Date: 10. Cataract Surgery Date: 2010. left Foot Surgery Date: 63. left Dilation and Curettage of Uterus Total Knee Replacement - Right Date: 01/2015.  Review of Systems General Present- Weight Gain. Not Present- Chills, Fatigue, Fever, Memory Loss, Night Sweats and Weight Loss. Skin Not Present- Eczema, Hives, Itching, Lesions and Rash. HEENT Present- Blurred Vision, Dentures and Headache. Not Present- Double Vision, Hearing Loss, Tinnitus and Visual Loss. Respiratory Present- Cough, Shortness of breath at rest and Shortness of breath with exertion. Not Present- Allergies, Chronic Cough and Coughing up blood. Cardiovascular Present- Difficulty Breathing Lying Down (usually sleeps elevated on two pillows). Not Present- Chest Pain, Murmur, Palpitations, Racing/skipping heartbeats and Swelling. Gastrointestinal Present- Diarrhea. Not Present- Abdominal Pain, Bloody Stool, Constipation, Difficulty Swallowing, Heartburn, Jaundice, Loss of appetitie, Nausea and Vomiting. Female Genitourinary Present- Urinary frequency and Urinating at Night. Not Present- Blood in Urine, Discharge, Flank Pain, Incontinence, Painful Urination, Urgency, Urinary Retention and Weak urinary stream. Musculoskeletal Present- Back Pain, Joint Pain, Joint Swelling, Morning Stiffness, Muscle Pain, Muscle Weakness and Spasms. Neurological Not Present- Blackout spells, Difficulty with balance, Dizziness, Paralysis, Tremor and Weakness. Psychiatric Not Present- Insomnia.  Vitals Weight: 205 lb Height: 65in Weight was reported by patient. Height was reported by patient. Body Surface Area: 2 m Body Mass Index: 34.11 kg/m  BP: 190/110 (Sitting, Right Arm, Standard) Patient states that she has "white coat  syndrome".  Physical Exam General Mental Status -Alert, cooperative and good historian. General Appearance-pleasant, Not in acute distress. Orientation-Oriented X3. Build & Nutrition-Well nourished and Well developed.  Head and Neck Head-normocephalic, atraumatic . Neck Global Assessment - supple, no bruit auscultated on the right, no bruit auscultated on the left. Note: upper partial denture plate   Eye Pupil - Bilateral-Regular and Round. Motion - Bilateral-EOMI.  Chest and Lung Exam Auscultation Breath sounds - clear at anterior chest wall and clear at posterior chest wall. Adventitious sounds - No Adventitious sounds.  Cardiovascular Auscultation Rhythm - Regular rate and rhythm. Heart Sounds - S1 WNL and S2 WNL. Murmurs & Other Heart Sounds - Auscultation of the heart reveals - No Murmurs.  Abdomen Palpation/Percussion Tenderness - Abdomen is non-tender to palpation. Rigidity (guarding) - Abdomen is soft. Auscultation Auscultation of the abdomen reveals - Bowel sounds normal.  Female Genitourinary Note: Not done, not pertinent to present illness   Musculoskeletal Note: On exam, she is alert and oriented, in no apparent distress. Her right knee looks excellent. No swelling. Range of motion about 5 to 120. There is no tenderness or instability. Left knee varus deformity, no effusion, range 5 to 120, marked crepitus on range of motion, tender medial greater than lateral, no instability noted.   Assessment & Plan Primary osteoarthritis of left knee (M17.12) Status post total right knee replacement (E83.151) Note:Surgical Plans: Left Total Knee Replacement  Disposition: Home  PCP: Dr. Hampton Abbot - Patient has been seen preoperatively and felt to be stable for surgery.  IV TXA  Anesthesia Issues: None  Signed electronically by Joelene Millin, III PA-C

## 2015-06-26 NOTE — Interval H&P Note (Signed)
History and Physical Interval Note:  06/26/2015 10:42 AM  Bonnie Gray  has presented today for surgery, with the diagnosis of oa left knee  The various methods of treatment have been discussed with the patient and family. After consideration of risks, benefits and other options for treatment, the patient has consented to  Procedure(s): LEFT TOTAL KNEE ARTHROPLASTY (Left) as a surgical intervention .  The patient's history has been reviewed, patient examined, no change in status, stable for surgery.  I have reviewed the patient's chart and labs.  Questions were answered to the patient's satisfaction.     Gearlean Alf

## 2015-06-26 NOTE — Anesthesia Postprocedure Evaluation (Signed)
  Anesthesia Post-op Note  Patient: Bonnie Gray  Procedure(s) Performed: Procedure(s) (LRB): LEFT TOTAL KNEE ARTHROPLASTY (Left)  Patient Location: PACU  Anesthesia Type: Spinal  Level of Consciousness: awake and alert   Airway and Oxygen Therapy: Patient Spontanous Breathing  Post-op Pain: mild  Post-op Assessment: Post-op Vital signs reviewed, Patient's Cardiovascular Status Stable, Respiratory Function Stable, Patent Airway and No signs of Nausea or vomiting  Last Vitals:  Filed Vitals:   06/26/15 1459  BP: 136/61  Pulse: 60  Temp: 36.4 C  Resp: 16    Post-op Vital Signs: stable   Complications: No apparent anesthesia complications

## 2015-06-26 NOTE — Transfer of Care (Signed)
Immediate Anesthesia Transfer of Care Note  Patient: Bonnie Gray  Procedure(s) Performed: Procedure(s): LEFT TOTAL KNEE ARTHROPLASTY (Left)  Patient Location: PACU  Anesthesia Type:SpinalT10  Level of Consciousness:  sedated, patient cooperative and responds to stimulation  Airway & Oxygen Therapy:Patient Spontanous Breathing and Patient connected to face mask oxgen  Post-op Assessment:  Report given to PACU RN and Post -op Vital signs reviewed and stable  Post vital signs:  Reviewed and stable  Last Vitals:  Filed Vitals:   06/26/15 0839  BP: 176/67  Pulse: 71  Temp: 36.7 C  Resp: 20    Complications: No apparent anesthesia complications

## 2015-06-26 NOTE — Anesthesia Preprocedure Evaluation (Signed)
Anesthesia Evaluation  Patient identified by MRN, date of birth, ID band Patient awake    Reviewed: Allergy & Precautions, H&P , NPO status , Patient's Chart, lab work & pertinent test results  Airway Mallampati: II  TM Distance: >3 FB Neck ROM: full    Dental  (+) Missing, Dental Advisory Given Many side teeth missing:   Pulmonary shortness of breath and with exertion,    Pulmonary exam normal breath sounds clear to auscultation       Cardiovascular Exercise Tolerance: Good hypertension, Pt. on medications and Pt. on home beta blockers Normal cardiovascular exam Rhythm:regular Rate:Normal  RBBB   Neuro/Psych negative neurological ROS  negative psych ROS   GI/Hepatic negative GI ROS, Neg liver ROS,   Endo/Other  negative endocrine ROS  Renal/GU negative Renal ROS  negative genitourinary   Musculoskeletal   Abdominal (+) + obese,   Peds  Hematology negative hematology ROS (+)   Anesthesia Other Findings   Reproductive/Obstetrics negative OB ROS                             Anesthesia Physical Anesthesia Plan  ASA: III  Anesthesia Plan: Spinal   Post-op Pain Management:    Induction:   Airway Management Planned: Simple Face Mask  Additional Equipment:   Intra-op Plan:   Post-operative Plan:   Informed Consent:   Plan Discussed with: Surgeon  Anesthesia Plan Comments:         Anesthesia Quick Evaluation

## 2015-06-26 NOTE — Anesthesia Procedure Notes (Signed)
Spinal Patient location during procedure: OR Start time: 06/26/2015 11:55 AM End time: 06/26/2015 12:05 PM Staffing Anesthesiologist: Rod Mae Performed by: anesthesiologist  Preanesthetic Checklist Completed: patient identified, site marked, surgical consent, pre-op evaluation, timeout performed, IV checked, risks and benefits discussed and monitors and equipment checked Spinal Block Patient position: sitting Prep: Betadine Patient monitoring: heart rate, continuous pulse ox and blood pressure Approach: right paramedian Location: L3-4 Injection technique: single-shot Needle Needle type: Spinocan  Needle gauge: 22 G Needle length: 9 cm Assessment Events: other event Additional Notes Expiration date of kit checked and confirmed. Patient tolerated procedure well, without complications. Heme return X 2 during spinal attempts but not when injected.

## 2015-06-27 LAB — CBC
HEMATOCRIT: 33.8 % — AB (ref 36.0–46.0)
HEMOGLOBIN: 11.3 g/dL — AB (ref 12.0–15.0)
MCH: 29 pg (ref 26.0–34.0)
MCHC: 33.4 g/dL (ref 30.0–36.0)
MCV: 86.9 fL (ref 78.0–100.0)
PLATELETS: 194 10*3/uL (ref 150–400)
RBC: 3.89 MIL/uL (ref 3.87–5.11)
RDW: 13.3 % (ref 11.5–15.5)
WBC: 16.5 10*3/uL — AB (ref 4.0–10.5)

## 2015-06-27 LAB — BASIC METABOLIC PANEL
ANION GAP: 8 (ref 5–15)
BUN: 9 mg/dL (ref 6–20)
CHLORIDE: 99 mmol/L — AB (ref 101–111)
CO2: 23 mmol/L (ref 22–32)
Calcium: 8 mg/dL — ABNORMAL LOW (ref 8.9–10.3)
Creatinine, Ser: 0.58 mg/dL (ref 0.44–1.00)
GFR calc Af Amer: >60 mL/min (ref >60)
GLUCOSE: 180 mg/dL — AB (ref 65–99)
POTASSIUM: 4.6 mmol/L (ref 3.5–5.1)
Sodium: 130 mmol/L — ABNORMAL LOW (ref 135–145)

## 2015-06-27 MED ORDER — TRAMADOL HCL 50 MG PO TABS: 50.0000 mg | ORAL_TABLET | Freq: Four times a day (QID) | ORAL | Status: DC | PRN

## 2015-06-27 MED ORDER — RIVAROXABAN 10 MG PO TABS: 10.0000 mg | ORAL_TABLET | Freq: Every day | ORAL | Status: DC

## 2015-06-27 MED ORDER — METHOCARBAMOL 500 MG PO TABS: 500.0000 mg | ORAL_TABLET | Freq: Four times a day (QID) | ORAL | Status: DC | PRN

## 2015-06-27 MED ORDER — HYDROMORPHONE HCL 2 MG PO TABS: 2.0000 mg | ORAL_TABLET | ORAL | Status: DC | PRN

## 2015-06-27 NOTE — Evaluation (Signed)
Occupational Therapy Evaluation Patient Details Name: Bonnie Gray MRN: [id number] DOB: 05-01-1941 Today's Date: 06/27/2015    History of Present Illness 74 yo female s/p L TKA. 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    Clinical Impression   Patient presenting with decreased ADL, functional mobility independence secondary to above. Patient mod I PTA. Patient currently functioning at an overall min assist level (according to PT evaluation). Patient will benefit from acute OT to increase overall independence in the areas of ADLs, functional mobility, and overall safety in order to safely discharge home with assistance from daughter. Unable to complete full evaluation of patient's mobility secondary to pt with complaints of pain and not willing to get OOB with this OT. Educated pt and daughter on importance of OOB activity/mobility for overall health, strength, endurance.     Follow Up Recommendations  No OT follow up;Supervision/Assistance - 24 hour    Equipment Recommendations  None recommended by OT    Recommendations for Other Services  None at this time    Precautions / Restrictions Precautions Precautions: Knee Required Braces or Orthoses: Knee Immobilizer - Left Knee Immobilizer - Left: Discontinue once straight leg raise with < 10 degree lag Restrictions Weight Bearing Restrictions: Yes LLE Weight Bearing: Weight bearing as tolerated    Mobility - Per PT evaluation Bed Mobility Overal bed mobility: Needs Assistance Bed Mobility: Supine to Sit     Supine to sit: Min assist     General bed mobility comments: small amount of assist for L LE  Transfers Overall transfer level: Needs assistance Equipment used: Rolling walker (2 wheeled) Transfers: Sit to/from Stand Sit to Stand: Min assist General transfer comment: assist to stabilize. vcs safety, technique, hand placement, LE placement.         ADL Overall ADL's : Needs assistance/impaired General ADL Comments: Pt refused any  OOB activity secondary to pain and muscle spasms. Discussed BR set-up with pt and pt's daughter. Both report that patient's RW is unable to fit in BR, therefore pt uses cane to get in/out of BR and in/out of shower. Pt refused to practice this, therefore would like to see patient tomorrow to ensure safety and functionality of this. Pt has all needed equipment (BSC, toilet rise, shower seat).     Pertinent Vitals/Pain Pain Assessment: 0-10 Pain Score: 7  Pain Location: left knee Pain Descriptors / Indicators: Aching;Sore Pain Intervention(s): Limited activity within patient's tolerance;Patient requesting pain meds-RN notified;RN gave pain meds during session     Hand Dominance Right   Extremity/Trunk Assessment Upper Extremity Assessment Upper Extremity Assessment: Generalized weakness   Lower Extremity Assessment Lower Extremity Assessment: Defer to PT evaluation LLE Deficits / Details: hip flex 3/5, moves ankle well   Cervical / Trunk Assessment Cervical / Trunk Assessment: Normal   Communication Communication Communication: No difficulties   Cognition Arousal/Alertness: Awake/alert Behavior During Therapy: WFL for tasks assessed/performed Overall Cognitive Status: Within Functional Limits for tasks assessed              Home Living Family/patient expects to be discharged to:: Private residence Living Arrangements: Alone Available Help at Discharge: Family;Available 24 hours/day (daughter, "until pt is walking on her own") Type of Home: House Home Access: Stairs to enter [employer] of Steps: 1 +1 Entrance Stairs-Rails: None Home Layout: Laundry or work area in basement     [employer] Shower/Tub: Occupational psychologist: [place]: Environmental consultant - 4 wheels;Bedside commode;Toilet riser;Shower seat;Cane - single point;Walker -  2 wheels   Additional Comments: Pt has 16 +2 steps to basement where she will eventually need to access to get  to laundry and freezer.      Prior Functioning/Environment Level of Independence: Needs assistance  Gait / Transfers Assistance Needed: using cane prior to surgery     Comments: used cane    OT Diagnosis: Generalized weakness;Acute pain   OT Problem List: Decreased strength;Decreased range of motion;Decreased activity tolerance;Decreased safety awareness;Impaired balance (sitting and/or standing);Pain;Decreased knowledge of use of DME or AE   OT Treatment/Interventions: Self-care/ADL training;Therapeutic exercise;Energy conservation;DME and/or AE instruction;Balance training;Patient/family education    OT Goals(Current goals can be found in the care plan section) Acute Rehab OT Goals Patient Stated Goal: go home soon OT Goal Formulation: With patient/family Time For Goal Achievement: 07/11/15 Potential to Achieve Goals: Good ADL Goals Pt Will Perform Grooming: with modified independence;standing Pt Will Perform Lower Body Bathing: with modified independence;sit to/from stand Pt Will Perform Lower Body Dressing: with modified independence;sit to/from stand Pt Will Perform Tub/Shower Transfer: Shower transfer;shower seat;ambulating;with supervision;rolling walker (RW vs cane) Additional ADL Goal #1: Pt will engage in functional mobility/ambulation using RW vs cane at a mod I level  OT Frequency: Min 2X/week   Barriers to D/C: None known at this time   End of Session Equipment Utilized During Treatment:  (no OOB activity/mobility occured) CPM Left Knee CPM Left Knee: Off  Activity Tolerance: Patient limited by pain Patient left: in bed;with call bell/phone within reach;with family/visitor present   Time: 5027-7412 OT Time Calculation (min): 16 min Charges:  OT General Charges $OT Visit: 1 Procedure OT Evaluation $Initial OT Evaluation Tier I: 1 Procedure  CLAY,PATRICIA , MS, OTR/L, CLT Pager: [phone]  06/27/2015, 12:01 PM

## 2015-06-27 NOTE — Evaluation (Signed)
Physical Therapy Evaluation Patient Details Name: Bonnie Gray MRN: [id number] DOB: Apr 06, 1941 Today's Date: 06/27/2015   History of Present Illness  74 yo female s/p L TKA. 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   Clinical Impression  On eval, pt required Min assist for mobility-walked ~100 feet with RW. Pain rated 6/10.     Follow Up Recommendations Home health PT    Equipment Recommendations  None recommended by PT    Recommendations for Other Services       Precautions / Restrictions Precautions Precautions: Knee Required Braces or Orthoses: Knee Immobilizer - Left Knee Immobilizer - Left: Discontinue once straight leg raise with < 10 degree lag Restrictions Weight Bearing Restrictions: No LLE Weight Bearing: Weight bearing as tolerated      Mobility  Bed Mobility Overal bed mobility: Needs Assistance Bed Mobility: Supine to Sit     Supine to sit: Min assist     General bed mobility comments: small amount of assist for L LE  Transfers Overall transfer level: Needs assistance Equipment used: Rolling walker (2 wheeled) Transfers: Sit to/from Stand Sit to Stand: Min assist         General transfer comment: assist to stabilize. vcs safety, technique, hand placement, LE placement.  Ambulation/Gait Ambulation/Gait assistance: Min guard Ambulation Distance (Feet): 100 Feet Assistive device: Rolling walker (2 wheeled) Gait Pattern/deviations: Step-to pattern;Antalgic     General Gait Details: close guad for safety. vcs safety, sequence.  Stairs            Wheelchair Mobility    Modified Rankin (Stroke Patients Only)       Balance                                             Pertinent Vitals/Pain Pain Assessment: 0-10 Pain Score: 6  Pain Location: L knee Pain Descriptors / Indicators: Aching;Sore Pain Intervention(s): Monitored during session;Ice applied;Repositioned    Home Living Family/patient expects to be discharged to::  Private residence Living Arrangements: Alone Available Help at Discharge: Family (daughter)   Home Access: Stairs to enter Entrance Stairs-Rails: None Entrance Stairs-Number of Steps: 1 +1 Home Layout: Laundry or work area in [place]: Environmental consultant - 4 wheels;Bedside commode;Toilet riser;Shower seat;Cane - single point;Walker - 2 wheels Additional Comments: Pt has 16 +2 steps to basement where she will eventually need to access to get to laundry and freezer.    Prior Function Level of Independence: Needs assistance   Gait / Transfers Assistance Needed: using cane prior to surgery           Hand Dominance        Extremity/Trunk Assessment   Upper Extremity Assessment: Defer to OT evaluation           Lower Extremity Assessment: LLE deficits/detail   LLE Deficits / Details: hip flex 3/5, moves ankle well  Cervical / Trunk Assessment: Normal  Communication   Communication: No difficulties  Cognition Arousal/Alertness: Awake/alert Behavior During Therapy: WFL for tasks assessed/performed Overall Cognitive Status: Within Functional Limits for tasks assessed                      General Comments      Exercises Total Joint Exercises Ankle Circles/Pumps: AROM;Both;10 reps;Supine Quad Sets: AROM;Both;10 reps;Supine Heel Slides: AAROM;Left;10 reps;Supine Hip ABduction/ADduction: AAROM;Left;10 reps;Supine Straight Leg Raises: AAROM;Left;10 reps;Supine Goniometric ROM: ~10-60 degrees  Assessment/Plan    PT Assessment Patient needs continued PT services  PT Diagnosis Difficulty walking;Acute pain   PT Problem List Decreased strength;Decreased activity tolerance;Decreased range of motion;Decreased balance;Decreased mobility;Decreased knowledge of use of DME;Pain  PT Treatment Interventions DME instruction;Gait training;Stair training;Functional mobility training;Therapeutic activities;Patient/family education;Balance training;Therapeutic  exercise   PT Goals (Current goals can be found in the Care Plan section) Acute Rehab PT Goals Patient Stated Goal: home soon PT Goal Formulation: With patient Time For Goal Achievement: 07/04/15 Potential to Achieve Goals: Good    Frequency 7X/week   Barriers to discharge        Co-evaluation               End of Session Equipment Utilized During Treatment: Left knee immobilizer Activity Tolerance: Patient tolerated treatment well Patient left: in chair;with call bell/phone within reach;with family/visitor present           Time: 0737-1062 PT Time Calculation (min) (ACUTE ONLY): 22 min   Charges:   PT Evaluation $Initial PT Evaluation Tier I: 1 Procedure     PT G Codes:        Weston Anna, MPT Pager: [phone]

## 2015-06-27 NOTE — Discharge Summary (Signed)
Physician Discharge Summary   Patient ID: Bonnie Gray MRN: [id number] DOB/AGE: Jun 30, 1941 74 y.o.  Admit date: 06/26/2015 Discharge date: 06-28-2015  Primary Diagnosis:  Osteoarthritis Left knee(s)  Admission Diagnoses:  Past Medical History  Diagnosis Date  . Hypertension   . Diarrhea   . Diverticulosis   . Anxiety   . Anemia     as a child  . Cataract     right  . Right bundle branch block   . History of blood transfusion   . Shortness of breath dyspnea     "because of the weight"/01/02/15- states unchanged  . Diverticulitis   . Chronic pain   . GERD (gastroesophageal reflux disease)   . Headache     migraines  . Macular degeneration     right   . Impaired vision     right eye   . Wears dentures   . Hemorrhoids   . History of urinary tract infection   . Osteoarthritis     knees; degenerative disc disease  . History of measles   . History of mumps   . History of rubella   . Menopause    Discharge Diagnoses:   Principal Problem:   OA (osteoarthritis) of knee  Estimated body mass index is 35.11 kg/(m^2) as calculated from the following:   Height as of this encounter: 5' 5"  (1.651 m).   Weight as of this encounter: 95.7 kg (210 lb 15.7 oz).  Procedure:  Procedure(s) (LRB): LEFT TOTAL KNEE ARTHROPLASTY (Left)   Consults: None  HPI: Bonnie Gray is a 74 y.o. 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 She has constant pain, with activity and at rest and significant functional deficits with difficulties even with ADLs. She has had extensive non-op management including analgesics, injections of cortisone and viscosupplements, and home exercise program, but remains in significant pain with significant dysfunction. Radiographs show bone on bone arthritis patellofemoral with patellar erosion. She presents now for left Total Knee Arthroplasty.   Laboratory Data: Admission on 06/26/2015    Component Date Value Ref Range Status  . WBC 06/27/2015 16.5* 4.0 - 10.5 K/uL Final  . RBC 06/27/2015 3.89  3.87 - 5.11 MIL/uL Final  . Hemoglobin 06/27/2015 11.3* 12.0 - 15.0 g/dL Final  . HCT 06/27/2015 33.8* 36.0 - 46.0 % Final  . MCV 06/27/2015 86.9  78.0 - 100.0 fL Final  . MCH 06/27/2015 29.0  26.0 - 34.0 pg Final  . MCHC 06/27/2015 33.4  30.0 - 36.0 g/dL Final  . RDW 06/27/2015 13.3  11.5 - 15.5 % Final  . Platelets 06/27/2015 194  150 - 400 K/uL Final  . Sodium 06/27/2015 130* 135 - 145 mmol/L Final  . Potassium 06/27/2015 4.6  3.5 - 5.1 mmol/L Final   Comment: MODERATE HEMOLYSIS HEMOLYSIS AT THIS LEVEL MAY AFFECT RESULT   . Chloride 06/27/2015 99* 101 - 111 mmol/L Final  . CO2 06/27/2015 23  22 - 32 mmol/L Final  . Glucose, Bld 06/27/2015 180* 65 - 99 mg/dL Final  . BUN 06/27/2015 9  6 - 20 mg/dL Final  . Creatinine, Ser 06/27/2015 0.58  0.44 - 1.00 mg/dL Final  . Calcium 06/27/2015 8.0* 8.9 - 10.3 mg/dL Final  . GFR calc non Af Amer 06/27/2015 >60  >60 mL/min Final  . GFR calc Af Amer 06/27/2015 >60  >60 mL/min Final   Comment: (NOTE) The eGFR has been calculated using the CKD EPI  equation. This calculation has not been validated in all clinical situations. eGFR's persistently <60 mL/min signify possible Chronic Kidney Disease.   Georgiann Hahn gap 06/27/2015 8  5 - 15 Final  Hospital Outpatient Visit on 06/20/2015  Component Date Value Ref Range Status  . aPTT 06/20/2015 30  24 - 37 seconds Final  . WBC 06/20/2015 8.6  4.0 - 10.5 K/uL Final  . RBC 06/20/2015 4.37  3.87 - 5.11 MIL/uL Final  . Hemoglobin 06/20/2015 12.8  12.0 - 15.0 g/dL Final  . HCT 06/20/2015 39.2  36.0 - 46.0 % Final  . MCV 06/20/2015 89.7  78.0 - 100.0 fL Final  . MCH 06/20/2015 29.3  26.0 - 34.0 pg Final  . MCHC 06/20/2015 32.7  30.0 - 36.0 g/dL Final  . RDW 06/20/2015 13.8  11.5 - 15.5 % Final  . Platelets 06/20/2015 240  150 - 400 K/uL Final  . Sodium 06/20/2015 138  135 - 145 mmol/L Final  .  Potassium 06/20/2015 3.7  3.5 - 5.1 mmol/L Final  . Chloride 06/20/2015 98* 101 - 111 mmol/L Final  . CO2 06/20/2015 31  22 - 32 mmol/L Final  . Glucose, Bld 06/20/2015 104* 65 - 99 mg/dL Final  . BUN 06/20/2015 9  6 - 20 mg/dL Final  . Creatinine, Ser 06/20/2015 0.64  0.44 - 1.00 mg/dL Final  . Calcium 06/20/2015 9.4  8.9 - 10.3 mg/dL Final  . Total Protein 06/20/2015 7.1  6.5 - 8.1 g/dL Final  . Albumin 06/20/2015 4.2  3.5 - 5.0 g/dL Final  . AST 06/20/2015 25  15 - 41 U/L Final  . ALT 06/20/2015 18  14 - 54 U/L Final  . Alkaline Phosphatase 06/20/2015 44  38 - 126 U/L Final  . Total Bilirubin 06/20/2015 1.2  0.3 - 1.2 mg/dL Final  . GFR calc non Af Amer 06/20/2015 >60  >60 mL/min Final  . GFR calc Af Amer 06/20/2015 >60  >60 mL/min Final   Comment: (NOTE) The eGFR has been calculated using the CKD EPI equation. This calculation has not been validated in all clinical situations. eGFR's persistently <60 mL/min signify possible Chronic Kidney Disease.   . Anion gap 06/20/2015 9  5 - 15 Final  . Prothrombin Time 06/20/2015 13.5  11.6 - 15.2 seconds Final  . INR 06/20/2015 1.01  0.00 - 1.49 Final  . ABO/RH(D) 06/20/2015 A POS   Final  . Antibody Screen 06/20/2015 NEG   Final  . Sample Expiration 06/20/2015 06/29/2015   Final  . Color, Urine 06/20/2015 YELLOW  YELLOW Final  . APPearance 06/20/2015 CLEAR  CLEAR Final  . Specific Gravity, Urine 06/20/2015 1.007  1.005 - 1.030 Final  . pH 06/20/2015 7.0  5.0 - 8.0 Final  . Glucose, UA 06/20/2015 NEGATIVE  NEGATIVE mg/dL Final  . Hgb urine dipstick 06/20/2015 NEGATIVE  NEGATIVE Final  . Bilirubin Urine 06/20/2015 NEGATIVE  NEGATIVE Final  . Ketones, ur 06/20/2015 NEGATIVE  NEGATIVE mg/dL Final  . Protein, ur 06/20/2015 NEGATIVE  NEGATIVE mg/dL Final  . Urobilinogen, UA 06/20/2015 0.2  0.0 - 1.0 mg/dL Final  . Nitrite 06/20/2015 NEGATIVE  NEGATIVE Final  . Leukocytes, UA 06/20/2015 NEGATIVE  NEGATIVE Final   MICROSCOPIC NOT DONE ON  URINES WITH NEGATIVE PROTEIN, BLOOD, LEUKOCYTES, NITRITE, OR GLUCOSE <1000 mg/dL.  Marland Kitchen MRSA, PCR 06/20/2015 NEGATIVE  NEGATIVE Final  . Staphylococcus aureus 06/20/2015 NEGATIVE  NEGATIVE Final   Comment:        The Xpert SA Assay (FDA  approved for NASAL specimens in patients over 72 years of age), is one component of a comprehensive surveillance program.  Test performance has been validated by [facility] for patients greater than or equal to 59 year old. It is not intended to diagnose infection nor to guide or monitor treatment.      X-Rays:No results found.  EKG: Orders placed or performed in visit on 01/09/15  . EKG 12-Lead     Hospital Course: Bonnie Gray is a 74 y.o. who was admitted to [facility]. They were brought to the operating room on 06/26/2015 and underwent Procedure(s): LEFT TOTAL KNEE ARTHROPLASTY.  Patient tolerated the procedure well and was later transferred to the recovery room and then to the orthopaedic floor for postoperative care.  They were given PO and IV analgesics for pain control following their surgery.  They were given 24 hours of postoperative antibiotics of  Anti-infectives    Start     Dose/Rate Route Frequency Ordered Stop   06/26/15 1800  ceFAZolin (ANCEF) IVPB 2 g/50 mL premix     2 g 100 mL/hr over 30 Minutes Intravenous Every 6 hours 06/26/15 1500 06/27/15 0043   06/26/15 0850  ceFAZolin (ANCEF) IVPB 2 g/50 mL premix     2 g 100 mL/hr over 30 Minutes Intravenous On call to O.R. 06/26/15 1638 06/26/15 1158     and started on DVT prophylaxis in the form of Xarelto.   PT and OT were ordered for total joint protocol.  Discharge planning consulted to help with postop disposition and equipment needs.  Patient had a decent night on the evening of surgery.  They started to get up OOB with therapy on day one. Hemovac drain was pulled without difficulty.  Continued to work with therapy into day two.  Dressing was changed on day two and  the incision was healing well.  Patient was seen in rounds and was ready to go home.  Discharge home with home health Diet - Cardiac diet Follow up - in 2 weeks Activity - WBAT Disposition - Home Condition Upon Discharge - Good D/C Meds - See DC Summary DVT Prophylaxis - Xarelto  Discharge Instructions    Call MD / Call 911    Complete by:  As directed   If you experience chest pain or shortness of breath, CALL 911 and be transported to the hospital emergency room.  If you develope a fever above 101 F, pus (white drainage) or increased drainage or redness at the wound, or calf pain, call your surgeon's office.     Change dressing    Complete by:  As directed   Change dressing daily with sterile 4 x 4 inch gauze dressing and apply TED hose. Do not submerge the incision under water.     Constipation Prevention    Complete by:  As directed   Drink plenty of fluids.  Prune juice may be helpful.  You may use a stool softener, such as Colace (over the counter) 100 mg twice a day.  Use MiraLax (over the counter) for constipation as needed.     Diet - low sodium heart healthy    Complete by:  As directed      Discharge instructions    Complete by:  As directed   Pick up stool softner and laxative for home use following surgery while on pain medications. Do not submerge incision under water. Please use good hand washing techniques while changing dressing each day. May shower  starting three days after surgery. Please use a clean towel to pat the incision dry following showers. Continue to use ice for pain and swelling after surgery. Do not use any lotions or creams on the incision until instructed by your surgeon.  Take Xarelto for two and a half more weeks, then discontinue Xarelto. Once the patient has completed the Xarelto, they may resume the 81 mg Aspirin.  Postoperative Constipation Protocol  Constipation - defined medically as fewer than three stools per week and severe constipation  as less than one stool per week.  One of the most common issues patients have following surgery is constipation.  Even if you have a regular bowel pattern at home, your normal regimen is likely to be disrupted due to multiple reasons following surgery.  Combination of anesthesia, postoperative narcotics, change in appetite and fluid intake all can affect your bowels.  In order to avoid complications following surgery, here are some recommendations in order to help you during your recovery period.  Colace (docusate) - Pick up an over-the-counter form of Colace or another stool softener and take twice a day as long as you are requiring postoperative pain medications.  Take with a full glass of water daily.  If you experience loose stools or diarrhea, hold the colace until you stool forms back up.  If your symptoms do not get better within 1 week or if they get worse, check with your doctor.  Dulcolax (bisacodyl) - Pick up over-the-counter and take as directed by the product packaging as needed to assist with the movement of your bowels.  Take with a full glass of water.  Use this product as needed if not relieved by Colace only.   MiraLax (polyethylene glycol) - Pick up over-the-counter to have on hand.  MiraLax is a solution that will increase the amount of water in your bowels to assist with bowel movements.  Take as directed and can mix with a glass of water, juice, soda, coffee, or tea.  Take if you go more than two days without a movement. Do not use MiraLax more than once per day. Call your doctor if you are still constipated or irregular after using this medication for 7 days in a row.  If you continue to have problems with postoperative constipation, please contact the office for further assistance and recommendations.  If you experience "the worst abdominal pain ever" or develop nausea or vomiting, please contact the office immediatly for further recommendations for treatment.     Do not put a  pillow under the knee. Place it under the heel.    Complete by:  As directed      Do not sit on low chairs, stoools or toilet seats, as it may be difficult to get up from low surfaces    Complete by:  As directed      Driving restrictions    Complete by:  As directed   No driving until released by the physician.     Increase activity slowly as tolerated    Complete by:  As directed      Lifting restrictions    Complete by:  As directed   No lifting until released by the physician.     Patient may shower    Complete by:  As directed   You may shower without a dressing once there is no drainage.  Do not wash over the wound.  If drainage remains, do not shower until drainage stops.  TED hose    Complete by:  As directed   Use stockings (TED hose) for 3 weeks on both leg(s).  You may remove them at night for sleeping.     Weight bearing as tolerated    Complete by:  As directed   Laterality:  left  Extremity:  Lower            Medication List    STOP taking these medications        aspirin EC 81 MG tablet     dicyclomine 10 MG capsule  Commonly known as:  BENTYL     multivitamin with minerals Tabs tablet     oxyCODONE 5 MG immediate release tablet  Commonly known as:  Oxy IR/ROXICODONE     trolamine salicylate 10 % cream  Commonly known as:  ASPERCREME      TAKE these medications        PRESERVISION AREDS PO  Take 1 tablet by mouth daily.     beta carotene w/minerals tablet  Take 1 tablet by mouth every morning.     diazepam 10 MG tablet  Commonly known as:  VALIUM  Take 2 mg by mouth 2 (two) times daily as needed for anxiety (mucsle spasm.).     fluticasone 50 MCG/ACT nasal spray  Commonly known as:  FLONASE  Place 1 spray into both nostrils daily as needed for allergies or rhinitis.     guaiFENesin-codeine 100-10 MG/5ML syrup  Commonly known as:  ROBITUSSIN AC  Take 15 mLs by mouth 2 (two) times daily as needed for cough or congestion.      HYDROmorphone 2 MG tablet  Commonly known as:  DILAUDID  Take 1-2 tablets (2-4 mg total) by mouth every 4 (four) hours as needed for moderate pain or severe pain.     lisinopril-hydrochlorothiazide 20-12.5 MG per tablet  Commonly known as:  PRINZIDE,ZESTORETIC  Take 2 tablets by mouth every morning.     loratadine 10 MG tablet  Commonly known as:  CLARITIN  Take 10 mg by mouth every morning.     methocarbamol 500 MG tablet  Commonly known as:  ROBAXIN  Take 1 tablet (500 mg total) by mouth every 6 (six) hours as needed for muscle spasms.     nebivolol 5 MG tablet  Commonly known as:  BYSTOLIC  Take 5 mg by mouth at bedtime.     rivaroxaban 10 MG Tabs tablet  Commonly known as:  XARELTO  Take 1 tablet (10 mg total) by mouth daily with breakfast. Take Xarelto for two and a half more weeks, then discontinue Xarelto. Once the patient has completed the Xarelto, they may resume the 81 mg Aspirin.     SYSTANE OP  Apply 1 drop to eye 2 (two) times daily as needed (Dry eyes).     traMADol 50 MG tablet  Commonly known as:  ULTRAM  Take 1-2 tablets (50-100 mg total) by mouth every 6 (six) hours as needed (mild pain).           Follow-up Information    Follow up with [facility].   Why:  Heron Sabins has been requested as your physical therapist   Contact information:   [address] [place] [address] [phone]       Follow up with Gearlean Alf, MD. Schedule an appointment as soon as possible for a visit on 07/11/2015.   Specialty:  Orthopedic Surgery   Why:  Call office at [phone] to  setup appointment on Tuesday 07/11/2015 with Dr. Denman George information:   [address] [place] [address] [phone]       Signed: [name], PA-C Orthopaedic Surgery 06/27/2015, 9:47 PM

## 2015-06-27 NOTE — Progress Notes (Signed)
Utilization review completed.  

## 2015-06-27 NOTE — Care Management Note (Signed)
Case Management Note  Patient Details  Name: NAYELIE GIONFRIDDO MRN: [id number] Date of Birth: October 23, 1940  Subjective/Objective:                   LEFT TOTAL KNEE ARTHROPLASTY (Left) Action/Plan:  Discharge planning Expected Discharge Date:  06/28/15              Expected Discharge Plan:  [place]  In-House Referral:     Discharge planning Services  CM Consult  Post Acute Care Choice:  Home Health Choice offered to:  Patient  DME Arranged:  N/A DME Agency:  NA  HH Arranged:  PT HH Agency:  [place]  Status of Service:  Completed, signed off  Medicare Important Message Given:    Date Medicare IM Given:    Medicare IM give by:    Date Additional Medicare IM Given:    Additional Medicare Important Message give by:     If discussed at [place] of Stay Meetings, dates discussed:    Additional Comments: CM met with pt in room to offer choice of home health agency.  Pt requests [facility] to render HHPT.  Address contact information verified by pt.  Referral called to [facility] rep, Tim requesting Joey.  Pt has DME at home.  No other CM needs were communicated.   Dellie Catholic, RN 06/27/2015, 3:51 PM

## 2015-06-27 NOTE — Progress Notes (Signed)
Physical Therapy Treatment Patient Details Name: Bonnie Gray MRN: [id number] DOB: 1941/05/14 Today's Date: 06/27/2015    History of Present Illness 74 yo female s/p L TKA. 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     PT Comments    Progressing with mobility. Pt reported increased pain after session this am. Will plan to practice step on tomorrow.   Follow Up Recommendations  Home health PT     Equipment Recommendations  None recommended by PT    Recommendations for Other Services       Precautions / Restrictions Precautions Precautions: Knee Required Braces or Orthoses: Knee Immobilizer - Left Knee Immobilizer - Left: Discontinue once straight leg raise with < 10 degree lag Restrictions Weight Bearing Restrictions: No LLE Weight Bearing: Weight bearing as tolerated    Mobility  Bed Mobility Overal bed mobility: Needs Assistance Bed Mobility: Sit to Supine     Supine to sit: Min assist     General bed mobility comments: small amount of assist for L LE  Transfers Overall transfer level: Needs assistance Equipment used: Rolling walker (2 wheeled) Transfers: Sit to/from Stand Sit to Stand: Min assist         General transfer comment: assist to stabilize. vcs safety, technique, hand placement, LE placement.  Ambulation/Gait Ambulation/Gait assistance: Min guard Ambulation Distance (Feet): 110 Feet Assistive device: Rolling walker (2 wheeled) Gait Pattern/deviations: Step-to pattern;Antalgic     General Gait Details: close guad for safety. vcs safety, sequence.   Stairs            Wheelchair Mobility    Modified Rankin (Stroke Patients Only)       Balance                                    Cognition Arousal/Alertness: Awake/alert Behavior During Therapy: WFL for tasks assessed/performed Overall Cognitive Status: Within Functional Limits for tasks assessed                      Exercises     General Comments         Pertinent Vitals/Pain Pain Assessment: 0-10 Pain Score: 6  Pain Location: L knee Pain Descriptors / Indicators: Aching;Sore Pain Intervention(s): Monitored during session;Ice applied;Repositioned    Home Living Family/patient expects to be discharged to:: Private residence Living Arrangements: Alone Available Help at Discharge: Family;Available 24 hours/day (daughter, "until pt is walking on her own") Type of Home: House Home Access: Stairs to enter Entrance Stairs-Rails: None Home Layout: Laundry or work area in [place]: Environmental consultant - 4 wheels;Bedside commode;Toilet riser;Shower seat;Cane - single point;Walker - 2 wheels Additional Comments: Pt has 16 +2 steps to basement where she will eventually need to access to get to laundry and freezer.    Prior Function Level of Independence: Needs assistance  Gait / Transfers Assistance Needed: using cane prior to surgery   Comments: used cane   PT Goals (current goals can now be found in the care plan section) Acute Rehab PT Goals Patient Stated Goal: go home soon PT Goal Formulation: With patient Time For Goal Achievement: 07/04/15 Potential to Achieve Goals: Good Progress towards PT goals: Progressing toward goals    Frequency  7X/week    PT Plan Current plan remains appropriate    Co-evaluation             End of Session Equipment Utilized During Treatment: Left knee immobilizer  Activity Tolerance: Patient tolerated treatment well Patient left: in bed;with call bell/phone within reach     Time: 1610-9604 PT Time Calculation (min) (ACUTE ONLY): 12 min  Charges:  $Gait Training: 8-22 mins                    G Codes:      Weston Anna, MPT Pager: [phone]

## 2015-06-27 NOTE — Discharge Instructions (Addendum)
° °Dr. Frank Aluisio °Total Joint Specialist °[facility] °[address] °[place], [place] [address] °[phone] ° °TOTAL KNEE REPLACEMENT POSTOPERATIVE DIRECTIONS ° °Knee Rehabilitation, Guidelines Following Surgery  °Results after knee surgery are often greatly improved when you follow the exercise, range of motion and muscle strengthening exercises prescribed by your doctor. Safety measures are also important to protect the knee from further injury. Any time any of these exercises cause you to have increased pain or swelling in your knee joint, decrease the amount until you are comfortable again and slowly increase them. If you have problems or questions, call your caregiver or physical therapist for advice.  ° °HOME CARE INSTRUCTIONS  °Remove items at home which could result in a fall. This includes throw rugs or furniture in walking pathways.  °· ICE to the affected knee every three hours for 30 minutes at a time and then as needed for pain and swelling.  Continue to use ice on the knee for pain and swelling from surgery. You may notice swelling that will progress down to the foot and ankle.  This is normal after surgery.  Elevate the leg when you are not up walking on it.   °· Continue to use the breathing machine which will help keep your temperature down.  It is common for your temperature to cycle up and down following surgery, especially at night when you are not up moving around and exerting yourself.  The breathing machine keeps your lungs expanded and your temperature down. °· Do not place pillow under knee, focus on keeping the knee straight while resting ° °DIET °You may resume your previous home diet once your are discharged from the hospital. ° °DRESSING / WOUND CARE / SHOWERING °You may shower 3 days after surgery, but keep the wounds dry during showering.  You may use an occlusive plastic wrap (Press'n Seal for example), NO SOAKING/SUBMERGING IN THE BATHTUB.  If the  bandage gets wet, change with a clean dry gauze.  If the incision gets wet, pat the wound dry with a clean towel. °You may start showering once you are discharged home but do not submerge the incision under water. Just pat the incision dry and apply a dry gauze dressing on daily. °Change the surgical dressing daily and reapply a dry dressing each time. ° °ACTIVITY °Walk with your walker as instructed. °Use walker as long as suggested by your caregivers. °Avoid periods of inactivity such as sitting longer than an hour when not asleep. This helps prevent blood clots.  °You may resume a sexual relationship in one month or when given the OK by your doctor.  °You may return to work once you are cleared by your doctor.  °Do not drive a car for 6 weeks or until released by you surgeon.  °Do not drive while taking narcotics. ° °WEIGHT BEARING °Weight bearing as tolerated with assist device (walker, cane, etc) as directed, use it as long as suggested by your surgeon or therapist, typically at least 4-6 weeks. ° °POSTOPERATIVE CONSTIPATION PROTOCOL °Constipation - defined medically as fewer than three stools per week and severe constipation as less than one stool per week. ° °One of the most common issues patients have following surgery is constipation.  Even if you have a regular bowel pattern at home, your normal regimen is likely to be disrupted due to multiple reasons following surgery.  Combination of anesthesia, postoperative narcotics, change in appetite and fluid intake all can affect your bowels.    In order to avoid complications following surgery, here are some recommendations in order to help you during your recovery period. ° °Colace (docusate) - Pick up an over-the-counter form of Colace or another stool softener and take twice a day as long as you are requiring postoperative pain medications.  Take with a full glass of water daily.  If you experience loose stools or diarrhea, hold the colace until you stool forms  back up.  If your symptoms do not get better within 1 week or if they get worse, check with your doctor. ° °Dulcolax (bisacodyl) - Pick up over-the-counter and take as directed by the product packaging as needed to assist with the movement of your bowels.  Take with a full glass of water.  Use this product as needed if not relieved by Colace only.  ° °MiraLax (polyethylene glycol) - Pick up over-the-counter to have on hand.  MiraLax is a solution that will increase the amount of water in your bowels to assist with bowel movements.  Take as directed and can mix with a glass of water, juice, soda, coffee, or tea.  Take if you go more than two days without a movement. °Do not use MiraLax more than once per day. Call your doctor if you are still constipated or irregular after using this medication for 7 days in a row. ° °If you continue to have problems with postoperative constipation, please contact the office for further assistance and recommendations.  If you experience "the worst abdominal pain ever" or develop nausea or vomiting, please contact the office immediatly for further recommendations for treatment. ° °ITCHING ° If you experience itching with your medications, try taking only a single pain pill, or even half a pain pill at a time.  You can also use Benadryl over the counter for itching or also to help with sleep.  ° °TED HOSE STOCKINGS °Wear the elastic stockings on both legs for three weeks following surgery during the day but you may remove then at night for sleeping. ° °MEDICATIONS °See your medication summary on the “After Visit Summary” that the nursing staff will review with you prior to discharge.  You may have some home medications which will be placed on hold until you complete the course of blood thinner medication.  It is important for you to complete the blood thinner medication as prescribed by your surgeon.  Continue your approved medications as instructed at time of  discharge. ° °PRECAUTIONS °If you experience chest pain or shortness of breath - call 911 immediately for transfer to the hospital emergency department.  °If you develop a fever greater that 101 F, purulent drainage from wound, increased redness or drainage from wound, foul odor from the wound/dressing, or calf pain - CONTACT YOUR SURGEON.   °                                                °FOLLOW-UP APPOINTMENTS °Make sure you keep all of your appointments after your operation with your surgeon and caregivers. You should call the office at the above phone number and make an appointment for approximately two weeks after the date of your surgery or on the date instructed by your surgeon outlined in the "After Visit Summary". ° ° °RANGE OF MOTION AND STRENGTHENING EXERCISES  °Rehabilitation of the knee is important following a knee injury or   an operation. After just a few days of immobilization, the muscles of the thigh which control the knee become weakened and shrink (atrophy). Knee exercises are designed to build up the tone and strength of the thigh muscles and to improve knee motion. Often times heat used for twenty to thirty minutes before working out will loosen up your tissues and help with improving the range of motion but do not use heat for the first two weeks following surgery. These exercises can be done on a training (exercise) mat, on the floor, on a table or on a bed. Use what ever works the best and is most comfortable for you Knee exercises include:  °Leg Lifts - While your knee is still immobilized in a splint or cast, you can do straight leg raises. Lift the leg to 60 degrees, hold for 3 sec, and slowly lower the leg. Repeat 10-20 times 2-3 times daily. Perform this exercise against resistance later as your knee gets better.  °Quad and Hamstring Sets - Tighten up the muscle on the front of the thigh (Quad) and hold for 5-10 sec. Repeat this 10-20 times hourly. Hamstring sets are done by pushing the  foot backward against an object and holding for 5-10 sec. Repeat as with quad sets.  °· Leg Slides: Lying on your back, slowly slide your foot toward your buttocks, bending your knee up off the floor (only go as far as is comfortable). Then slowly slide your foot back down until your leg is flat on the floor again. °· Angel Wings: Lying on your back spread your legs to the side as far apart as you can without causing discomfort.  °A rehabilitation program following serious knee injuries can speed recovery and prevent re-injury in the future due to weakened muscles. Contact your doctor or a physical therapist for more information on knee rehabilitation.  ° °IF YOU ARE TRANSFERRED TO A SKILLED REHAB FACILITY °If the patient is transferred to a skilled rehab facility following release from the hospital, a list of the current medications will be sent to the facility for the patient to continue.  When discharged from the skilled rehab facility, please have the facility set up the patient's Home Health Physical Therapy prior to being released. Also, the skilled facility will be responsible for providing the patient with their medications at time of release from the facility to include their pain medication, the muscle relaxants, and their blood thinner medication. If the patient is still at the rehab facility at time of the two week follow up appointment, the skilled rehab facility will also need to assist the patient in arranging follow up appointment in our office and any transportation needs. ° °MAKE SURE YOU:  °Understand these instructions.  °Get help right away if you are not doing well or get worse.  ° ° °Pick up stool softner and laxative for home use following surgery while on pain medications. °Do not submerge incision under water. °Please use good hand washing techniques while changing dressing each day. °May shower starting three days after surgery. °Please use a clean towel to pat the incision dry following  showers. °Continue to use ice for pain and swelling after surgery. °Do not use any lotions or creams on the incision until instructed by your surgeon. ° °Take Xarelto for two and a half more weeks, then discontinue Xarelto. °Once the patient has completed the Xarelto, they may resume the 81 mg Aspirin. ° ° °Information on my medicine - XARELTO® (Rivaroxaban) ° °  This medication education was reviewed with me or my healthcare representative as part of my discharge preparation.  The pharmacist that spoke with me during my hospital stay was:  Gadhia, Jigna M, RPH ° °Why was Xarelto® prescribed for you? °Xarelto® was prescribed for you to reduce the risk of blood clots forming after orthopedic surgery. The medical term for these abnormal blood clots is venous thromboembolism (VTE). ° °What do you need to know about xarelto® ? °Take your Xarelto® ONCE DAILY at the same time every day. °You may take it either with or without food. ° °If you have difficulty swallowing the tablet whole, you may crush it and mix in applesauce just prior to taking your dose. ° °Take Xarelto® exactly as prescribed by your doctor and DO NOT stop taking Xarelto® without talking to the doctor who prescribed the medication.  Stopping without other VTE prevention medication to take the place of Xarelto® may increase your risk of developing a clot. ° °After discharge, you should have regular check-up appointments with your healthcare provider that is prescribing your Xarelto®.   ° °What do you do if you miss a dose? °If you miss a dose, take it as soon as you remember on the same day then continue your regularly scheduled once daily regimen the next day. Do not take two doses of Xarelto® on the same day.  ° °Important Safety Information °A possible side effect of Xarelto® is bleeding. You should call your healthcare provider right away if you experience any of the following: °? Bleeding from an injury or your nose that does not stop. °? Unusual  colored urine (red or dark brown) or unusual colored stools (red or black). °? Unusual bruising for unknown reasons. °? A serious fall or if you hit your head (even if there is no bleeding). ° °Some medicines may interact with Xarelto® and might increase your risk of bleeding while on Xarelto®. To help avoid this, consult your healthcare provider or pharmacist prior to using any new prescription or non-prescription medications, including herbals, vitamins, non-steroidal anti-inflammatory drugs (NSAIDs) and supplements. ° °This website has more information on Xarelto®: www.xarelto.com. ° ° °

## 2015-06-27 NOTE — Progress Notes (Addendum)
   Subjective: 1 Day Post-Op Procedure(s) (LRB): LEFT TOTAL KNEE ARTHROPLASTY (Left) Patient reports pain as mild.   Patient seen in rounds with Dr. Wynelle Link. Rough night with very little sleep. Patient is well, and has had no acute complaints or problems We will start therapy today.  Plan is to go Home after hospital stay.  Objective: Vital signs in last 24 hours: Temp:  [97.5 F (36.4 C)-98.4 F (36.9 C)] 98.3 F (36.8 C) (09/20 0550) Pulse Rate:  [58-77] 72 (09/20 0550) Resp:  [13-20] 16 (09/20 0550) BP: (129-176)/(57-73) 139/66 mmHg (09/20 0550) SpO2:  [95 %-100 %] 96 % (09/20 0550) Weight:  [95.7 kg (210 lb 15.7 oz)-95.709 kg (211 lb)] 95.7 kg (210 lb 15.7 oz) (09/19 1830)  Intake/Output from previous day:  Intake/Output Summary (Last 24 hours) at 06/27/15 0744 Last data filed at 06/27/15 0500  Gross per 24 hour  Intake   1825 ml  Output   2400 ml  Net   -575 ml    Intake/Output this shift: UOP 450 since around [place]  Labs:  Recent Labs  06/27/15 0510  HGB 11.3*    Recent Labs  06/27/15 0510  WBC 16.5*  RBC 3.89  HCT 33.8*  PLT 194    Recent Labs  06/27/15 0510  NA 130*  K 4.6  CL 99*  CO2 23  BUN 9  CREATININE 0.58  GLUCOSE 180*  CALCIUM 8.0*   No results for input(s): LABPT, INR in the last 72 hours.  EXAM General - Patient is Alert, Appropriate and Oriented Extremity - Neurovascular intact Sensation intact distally Dressing - dressing C/D/I Motor Function - intact, moving foot and toes well on exam.  Hemovac pulled without difficulty.  Past Medical History  Diagnosis Date  . Hypertension   . Diarrhea   . Diverticulosis   . Anxiety   . Anemia     as a child  . Cataract     right  . Right bundle branch block   . History of blood transfusion   . Shortness of breath dyspnea     "because of the weight"/01/02/15- states unchanged  . Diverticulitis   . Chronic pain   . GERD (gastroesophageal reflux disease)   . Headache    migraines  . Macular degeneration     right   . Impaired vision     right eye   . Wears dentures   . Hemorrhoids   . History of urinary tract infection   . Osteoarthritis     knees; degenerative disc disease  . History of measles   . History of mumps   . History of rubella   . Menopause     Assessment/Plan: 1 Day Post-Op Procedure(s) (LRB): LEFT TOTAL KNEE ARTHROPLASTY (Left) Principal Problem:   OA (osteoarthritis) of knee  Estimated body mass index is 35.11 kg/(m^2) as calculated from the following:   Height as of this encounter: 5\' 5"  (1.651 m).   Weight as of this encounter: 95.7 kg (210 lb 15.7 oz). Advance diet Up with therapy Plan for discharge tomorrow Discharge home with home health  DVT Prophylaxis - Xarelto Weight-Bearing as tolerated to left leg D/C O2 and Pulse OX and try on Room Air Lisinopril on hold postop - resume if BP goes up  [name], PA-C Orthopaedic Surgery 06/27/2015, 7:44 AM

## 2015-06-28 LAB — BASIC METABOLIC PANEL
Anion gap: 7 (ref 5–15)
BUN: 11 mg/dL (ref 6–20)
CALCIUM: 8.3 mg/dL — AB (ref 8.9–10.3)
CO2: 29 mmol/L (ref 22–32)
CREATININE: 0.59 mg/dL (ref 0.44–1.00)
Chloride: 100 mmol/L — ABNORMAL LOW (ref 101–111)
GFR calc non Af Amer: >60 mL/min (ref >60)
Glucose, Bld: 143 mg/dL — ABNORMAL HIGH (ref 65–99)
Potassium: 3.6 mmol/L (ref 3.5–5.1)
SODIUM: 136 mmol/L (ref 135–145)

## 2015-06-28 LAB — CBC
HCT: 29.7 % — ABNORMAL LOW (ref 36.0–46.0)
Hemoglobin: 9.8 g/dL — ABNORMAL LOW (ref 12.0–15.0)
MCH: 29.4 pg (ref 26.0–34.0)
MCHC: 33 g/dL (ref 30.0–36.0)
MCV: 89.2 fL (ref 78.0–100.0)
Platelets: 219 10*3/uL (ref 150–400)
RBC: 3.33 MIL/uL — ABNORMAL LOW (ref 3.87–5.11)
RDW: 14 % (ref 11.5–15.5)
WBC: 15.6 10*3/uL — ABNORMAL HIGH (ref 4.0–10.5)

## 2015-06-28 NOTE — Progress Notes (Signed)
Occupational Therapy Treatment Patient Details Name: Bonnie Gray MRN: [id number] DOB: 1941/07/22 Today's Date: 06/28/2015    History of present illness 74 yo female s/p L TKA. 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    OT comments  Patient progressing towards OT goals, continue plan of care for now. Encouraged pt to perform sponge baths post acute d/c until LLE gets stronger and patient's balance improves; patient and daughter verbalized understanding of this. Practiced simulated walk-in shower transfer like pt would do at home and pt required mod assist to perform this.    Follow Up Recommendations  No OT follow up;Supervision/Assistance - 24 hour    Equipment Recommendations  None recommended by OT    Recommendations for Other Services  None at this time  Precautions / Restrictions Precautions Precautions: Knee Restrictions Weight Bearing Restrictions: Yes LLE Weight Bearing: Weight bearing as tolerated    Mobility Bed Mobility Overal bed mobility: Needs Assistance Bed Mobility: Sit to Supine       Sit to supine: Mod assist   General bed mobility comments: Daughter assisting with sit>supine. Pt required assistance/managment of BLEs and pt used trapeze bar.   Transfers Overall transfer level: Needs assistance Equipment used: Rolling walker (2 wheeled) Transfers: Sit to/from Stand Sit to Stand: Supervision General transfer comment: close supervision for safety and cues for technique    Balance Overall balance assessment: Needs assistance Sitting-balance support: No upper extremity supported;Feet supported Sitting balance-Leahy Scale: Good     Standing balance support: During functional activity;Single extremity supported Standing balance-Leahy Scale: Poor Standing balance comment: when using quad cane, pt fair with BUE support using RW   ADL Overall ADL's : Needs assistance/impaired General ADL Comments: Pt unable to reach BLEs, but states she has AE to increase independence  with LB ADLs. Pt found seated EOB, pt stood with RW and ambulated into BR. Toilet transfer completed using RW and pt stated secondary to not enough room in BR she normally uses quad cane to transfer toilet> walk-in shower. Practiced this, simulated like pt would perform walk-in shower transfer at home.  Pt with difficult time using quad cane due to immobilization and pain in LLE. Encouraged pt to perform sponge baths at home until she gets stronger and able to more safely use quad can for shower stall transfers; both pt and daughter agree with this. Pt's daughter present entire session and able to assist prn post acute d/c . Pt eager to get back to bed at end of this session secondary to being "worn out".      Cognition   Behavior During Therapy: WFL for tasks assessed/performed Overall Cognitive Status: Within Functional Limits for tasks assessed                 Pertinent Vitals/ Pain       Pain Assessment: 0-10 Pain Score: 4  Pain Location: L knee Pain Descriptors / Indicators: Aching;Sore Pain Intervention(s): Limited activity within patient's tolerance;Monitored during session   Frequency Min 2X/week     Progress Toward Goals  OT Goals(current goals can now befound in the care plan section)  Progress towards OT goals: Progressing toward goals     Plan Discharge plan remains appropriate    End of Session Equipment Utilized During Treatment: Gait belt;Rolling walker;Other (comment) (quad cane)   Activity Tolerance Patient tolerated treatment well   Patient Left in bed;with call bell/phone within reach;with family/visitor present    Time: 1275-1700 OT Time Calculation (min): 12 min  Charges: OT General Charges $  OT Visit: 1 Procedure OT Treatments $Self Care/Home Management : 8-22 mins  CLAY,PATRICIA , MS, OTR/L, CLT Pager: [phone]  06/28/2015, 10:12 AM

## 2015-06-28 NOTE — Progress Notes (Signed)
   Subjective: 2 Days Post-Op Procedure(s) (LRB): LEFT TOTAL KNEE ARTHROPLASTY (Left) Patient reports pain as mild.   Patient seen in rounds by Dr. Wynelle Link. Patient is well, and has had no acute complaints or problems Patient is ready to go home  Objective: Vital signs in last 24 hours: Temp:  [97.9 F (36.6 C)-99 F (37.2 C)] 99 F (37.2 C) (09/21 0510) Pulse Rate:  [60-67] 67 (09/21 0510) Resp:  [16-18] 16 (09/21 0510) BP: (130-158)/(60-67) 130/63 mmHg (09/21 0510) SpO2:  [96 %-98 %] 96 % (09/21 0510)  Intake/Output from previous day:  Intake/Output Summary (Last 24 hours) at 06/28/15 0652 Last data filed at 06/28/15 0625  Gross per 24 hour  Intake   1320 ml  Output   1050 ml  Net    270 ml    Intake/Output this shift: Total I/O In: 480 [P.O.:480] Out: 150 [Urine:150]  Labs:  Recent Labs  06/27/15 0510  HGB 11.3*    Recent Labs  06/27/15 0510  WBC 16.5*  RBC 3.89  HCT 33.8*  PLT 194    Recent Labs  06/27/15 0510  NA 130*  K 4.6  CL 99*  CO2 23  BUN 9  CREATININE 0.58  GLUCOSE 180*  CALCIUM 8.0*   No results for input(s): LABPT, INR in the last 72 hours.  EXAM: General - Patient is Alert and Appropriate Extremity - Neurovascular intact Sensation intact distally Incision - clean, dry Motor Function - intact, moving foot and toes well on exam.   Assessment/Plan: 2 Days Post-Op Procedure(s) (LRB): LEFT TOTAL KNEE ARTHROPLASTY (Left) Procedure(s) (LRB): LEFT TOTAL KNEE ARTHROPLASTY (Left) Past Medical History  Diagnosis Date  . Hypertension   . Diarrhea   . Diverticulosis   . Anxiety   . Anemia     as a child  . Cataract     right  . Right bundle branch block   . History of blood transfusion   . Shortness of breath dyspnea     "because of the weight"/01/02/15- states unchanged  . Diverticulitis   . Chronic pain   . GERD (gastroesophageal reflux disease)   . Headache     migraines  . Macular degeneration     right   .  Impaired vision     right eye   . Wears dentures   . Hemorrhoids   . History of urinary tract infection   . Osteoarthritis     knees; degenerative disc disease  . History of measles   . History of mumps   . History of rubella   . Menopause    Principal Problem:   OA (osteoarthritis) of knee  Estimated body mass index is 35.11 kg/(m^2) as calculated from the following:   Height as of this encounter: 5\' 5"  (1.651 m).   Weight as of this encounter: 95.7 kg (210 lb 15.7 oz). Up with therapy Discharge home with home health Diet - Cardiac diet Follow up - in 2 weeks Activity - WBAT Disposition - Home Condition Upon Discharge - Good D/C Meds - See DC Summary DVT Prophylaxis - Xarelto  [name], PA-C Orthopaedic Surgery 06/28/2015, 6:52 AM

## 2015-06-28 NOTE — Care Management Important Message (Signed)
Important Message  Patient Details  Name: LINDEY RENZULLI MRN: [id number] Date of Birth: [birth year]   Medicare Important Message Given:  [facility] notification given    Camillo Flaming 06/28/2015, 11:56 AMImportant Message  Patient Details  Name: ARSENIA GORACKE MRN: [id number] Date of Birth: [birth year]   Medicare Important Message Given:  Yes-second notification given    Camillo Flaming 06/28/2015, 11:56 AM

## 2015-06-28 NOTE — Progress Notes (Signed)
Physical Therapy Treatment Patient Details Name: JAMACIA JESTER MRN: [id number] DOB: 1940/11/28 Today's Date: 06/28/2015    History of Present Illness 74 yo female s/p L TKA. 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     PT Comments    Reviewed gait training, exercises. Verbally reviewed stair negotiation. All education completed. Ready to d/c from PT standpoint.   Follow Up Recommendations  Home health PT     Equipment Recommendations  None recommended by PT    Recommendations for Other Services       Precautions / Restrictions Precautions Precautions: Knee Required Braces or Orthoses: Knee Immobilizer - Left Knee Immobilizer - Left: Discontinue once straight leg raise with < 10 degree lag Restrictions Weight Bearing Restrictions: No LLE Weight Bearing: Weight bearing as tolerated    Mobility  Bed Mobility   G  Transfers Overall transfer level: Needs assistance Equipment used: Rolling walker (2 wheeled) Transfers: Sit to/from Stand Sit to Stand: Min guard         General transfer comment: close guard for safety.   Ambulation/Gait Ambulation/Gait assistance: Min guard Ambulation Distance (Feet): 100 Feet Assistive device: Rolling walker (2 wheeled)       General Gait Details: close guad for safety. vcs safety, sequence.   Stairs Stairs:  (pt declined practice-both pt and family memeber felt comfortable and familiar from last stay)          Wheelchair Mobility    Modified Rankin (Stroke Patients Only)       Balance Overall balance assessment: Needs assistance Sitting-balance support: No upper extremity supported;Feet supported Sitting balance-Leahy Scale: Good     Standing balance support: During functional activity;Single extremity supported Standing balance-Leahy Scale: Poor Standing balance comment: when using quad cane, pt fair with BUE support using RW                    Cognition Arousal/Alertness: Awake/alert Behavior During Therapy: WFL  for tasks assessed/performed Overall Cognitive Status: Within Functional Limits for tasks assessed                      Exercises Total Joint Exercises Ankle Circles/Pumps: AROM;Both;10 reps;Supine Quad Sets: AROM;Both;10 reps;Supine Heel Slides: AAROM;Left;10 reps;Supine Hip ABduction/ADduction: AAROM;Left;10 reps;Supine Straight Leg Raises: AAROM;Left;10 reps;Supine Goniometric ROM: ~10-60 degrees    General Comments        Pertinent Vitals/Pain Pain Assessment: 0-10 Pain Score: 5  Pain Location: L knee Pain Descriptors / Indicators: Aching;Sore Pain Intervention(s): Monitored during session;Ice applied;Repositioned    Home Living                      Prior Function            PT Goals (current goals can now be found in the care plan section) Progress towards PT goals: Progressing toward goals    Frequency  7X/week    PT Plan Current plan remains appropriate    Co-evaluation             End of Session   Activity Tolerance: Patient tolerated treatment well Patient left: in chair;with call bell/phone within reach;with family/visitor present     Time: 6195-0932 PT Time Calculation (min) (ACUTE ONLY): 24 min  Charges:  $Gait Training: 8-22 mins $Therapeutic Exercise: 8-22 mins                    G Codes:      Weston Anna, MPT Pager: [phone]

## 2015-07-10 ENCOUNTER — Ambulatory Visit (HOSPITAL_COMMUNITY)
Admission: RE | Admit: 2015-07-10 | Discharge: 2015-07-10 | Disposition: A | Discharge status: Home / Self Care | Payer: Medicare Other | Source: Ambulatory Visit | Attending: Cardiology | Admitting: Cardiology

## 2015-07-10 ENCOUNTER — Other Ambulatory Visit (HOSPITAL_COMMUNITY): Payer: Self-pay | Admitting: Orthopedic Surgery

## 2015-07-10 DIAGNOSIS — M7989 Other specified soft tissue disorders: Secondary | ICD-10-CM | POA: Diagnosis present

## 2015-07-10 DIAGNOSIS — G8918 Other acute postprocedural pain: Secondary | ICD-10-CM | POA: Diagnosis not present

## 2015-07-10 DIAGNOSIS — I10 Essential (primary) hypertension: Secondary | ICD-10-CM | POA: Insufficient documentation

## 2015-07-10 DIAGNOSIS — M79604 Pain in right leg: Secondary | ICD-10-CM | POA: Diagnosis not present

## 2015-08-01 ENCOUNTER — Encounter (HOSPITAL_COMMUNITY): Payer: Self-pay | Admitting: Emergency Medicine

## 2015-08-01 ENCOUNTER — Emergency Department (HOSPITAL_COMMUNITY): Payer: Medicare Other

## 2015-08-01 ENCOUNTER — Emergency Department (HOSPITAL_COMMUNITY): Payer: Medicare Other | Admitting: Anesthesiology

## 2015-08-01 ENCOUNTER — Encounter (HOSPITAL_COMMUNITY)
Admission: EM | Disposition: A | Discharge status: Home / Self Care | Payer: Self-pay | Source: Home / Self Care | Attending: Emergency Medicine

## 2015-08-01 ENCOUNTER — Observation Stay (HOSPITAL_COMMUNITY)
Admission: EM | Admit: 2015-08-01 | Discharge: 2015-08-02 | Disposition: A | Discharge status: Home / Self Care | Payer: Medicare Other | Attending: Orthopedic Surgery | Admitting: Orthopedic Surgery

## 2015-08-01 DIAGNOSIS — Z7982 Long term (current) use of aspirin: Secondary | ICD-10-CM | POA: Insufficient documentation

## 2015-08-01 DIAGNOSIS — I4510 Unspecified right bundle-branch block: Secondary | ICD-10-CM | POA: Diagnosis not present

## 2015-08-01 DIAGNOSIS — Z79899 Other long term (current) drug therapy: Secondary | ICD-10-CM | POA: Insufficient documentation

## 2015-08-01 DIAGNOSIS — M25522 Pain in left elbow: Secondary | ICD-10-CM | POA: Insufficient documentation

## 2015-08-01 DIAGNOSIS — I10 Essential (primary) hypertension: Secondary | ICD-10-CM | POA: Diagnosis not present

## 2015-08-01 DIAGNOSIS — F419 Anxiety disorder, unspecified: Secondary | ICD-10-CM | POA: Insufficient documentation

## 2015-08-01 DIAGNOSIS — W541XXA Struck by dog, initial encounter: Secondary | ICD-10-CM | POA: Diagnosis not present

## 2015-08-01 DIAGNOSIS — Z79891 Long term (current) use of opiate analgesic: Secondary | ICD-10-CM | POA: Diagnosis not present

## 2015-08-01 DIAGNOSIS — K219 Gastro-esophageal reflux disease without esophagitis: Secondary | ICD-10-CM | POA: Diagnosis not present

## 2015-08-01 DIAGNOSIS — M179 Osteoarthritis of knee, unspecified: Secondary | ICD-10-CM | POA: Insufficient documentation

## 2015-08-01 DIAGNOSIS — H3530 Unspecified macular degeneration: Secondary | ICD-10-CM | POA: Diagnosis not present

## 2015-08-01 DIAGNOSIS — E669 Obesity, unspecified: Secondary | ICD-10-CM | POA: Insufficient documentation

## 2015-08-01 DIAGNOSIS — R0781 Pleurodynia: Secondary | ICD-10-CM | POA: Diagnosis not present

## 2015-08-01 DIAGNOSIS — Z791 Long term (current) use of non-steroidal anti-inflammatories (NSAID): Secondary | ICD-10-CM | POA: Diagnosis not present

## 2015-08-01 DIAGNOSIS — Z96652 Presence of left artificial knee joint: Secondary | ICD-10-CM | POA: Insufficient documentation

## 2015-08-01 DIAGNOSIS — Y92096 Garden or yard of other non-institutional residence as the place of occurrence of the external cause: Secondary | ICD-10-CM | POA: Insufficient documentation

## 2015-08-01 DIAGNOSIS — Z6833 Body mass index (BMI) 33.0-33.9, adult: Secondary | ICD-10-CM | POA: Diagnosis not present

## 2015-08-01 DIAGNOSIS — Z9049 Acquired absence of other specified parts of digestive tract: Secondary | ICD-10-CM | POA: Diagnosis not present

## 2015-08-01 DIAGNOSIS — T8131XA Disruption of external operation (surgical) wound, not elsewhere classified, initial encounter: Secondary | ICD-10-CM | POA: Diagnosis not present

## 2015-08-01 DIAGNOSIS — Z7901 Long term (current) use of anticoagulants: Secondary | ICD-10-CM | POA: Insufficient documentation

## 2015-08-01 DIAGNOSIS — T8130XA Disruption of wound, unspecified, initial encounter: Secondary | ICD-10-CM | POA: Diagnosis present

## 2015-08-01 HISTORY — PX: INCISION AND DRAINAGE: SHX5863

## 2015-08-01 LAB — PROTIME-INR
INR: 1.07 (ref 0.00–1.49)
Prothrombin Time: 14.1 seconds (ref 11.6–15.2)

## 2015-08-01 LAB — URINALYSIS, ROUTINE W REFLEX MICROSCOPIC
Bilirubin Urine: NEGATIVE
GLUCOSE, UA: NEGATIVE mg/dL
Hgb urine dipstick: NEGATIVE
KETONES UR: NEGATIVE mg/dL
LEUKOCYTES UA: NEGATIVE
NITRITE: NEGATIVE
PH: 6.5 (ref 5.0–8.0)
Protein, ur: NEGATIVE mg/dL
SPECIFIC GRAVITY, URINE: 1.011 (ref 1.005–1.030)
Urobilinogen, UA: 0.2 mg/dL (ref 0.0–1.0)

## 2015-08-01 LAB — CBC
HCT: 32.3 % — ABNORMAL LOW (ref 36.0–46.0)
Hemoglobin: 10.5 g/dL — ABNORMAL LOW (ref 12.0–15.0)
MCH: 27.6 pg (ref 26.0–34.0)
MCHC: 32.5 g/dL (ref 30.0–36.0)
MCV: 85 fL (ref 78.0–100.0)
PLATELETS: 291 10*3/uL (ref 150–400)
RBC: 3.8 MIL/uL — ABNORMAL LOW (ref 3.87–5.11)
RDW: 13.9 % (ref 11.5–15.5)
WBC: 12.5 10*3/uL — ABNORMAL HIGH (ref 4.0–10.5)

## 2015-08-01 LAB — BASIC METABOLIC PANEL
Anion gap: 9 (ref 5–15)
BUN: 9 mg/dL (ref 6–20)
CALCIUM: 9 mg/dL (ref 8.9–10.3)
CO2: 27 mmol/L (ref 22–32)
Chloride: 97 mmol/L — ABNORMAL LOW (ref 101–111)
Creatinine, Ser: 0.55 mg/dL (ref 0.44–1.00)
GFR calc Af Amer: >60 mL/min (ref >60)
GLUCOSE: 130 mg/dL — AB (ref 65–99)
POTASSIUM: 3.6 mmol/L (ref 3.5–5.1)
Sodium: 133 mmol/L — ABNORMAL LOW (ref 135–145)

## 2015-08-01 LAB — SURGICAL PCR SCREEN
MRSA, PCR: NEGATIVE
STAPHYLOCOCCUS AUREUS: NEGATIVE

## 2015-08-01 SURGERY — INCISION AND DRAINAGE
Anesthesia: General | Site: Knee | Laterality: Left

## 2015-08-01 MED ORDER — MORPHINE SULFATE (PF) 10 MG/ML IV SOLN: 1.0000 mg | INTRAVENOUS | Status: DC | PRN

## 2015-08-01 MED ORDER — ISOPROPYL ALCOHOL 70 % SOLN
Status: AC
Start: 2015-08-01 — End: 2015-08-01
  Filled 2015-08-01: qty 480

## 2015-08-01 MED ORDER — ASPIRIN EC 81 MG PO TBEC
81.0000 mg | DELAYED_RELEASE_TABLET | Freq: Every day | ORAL | Status: DC
  Administered 2015-08-02: 81 mg via ORAL
  Filled 2015-08-01: qty 1

## 2015-08-01 MED ORDER — ONDANSETRON HCL 4 MG/2ML IJ SOLN: 4.0000 mg | Freq: Four times a day (QID) | INTRAMUSCULAR | Status: DC | PRN

## 2015-08-01 MED ORDER — CEFAZOLIN SODIUM-DEXTROSE 2-3 GM-% IV SOLR
INTRAVENOUS | Status: AC
  Filled 2015-08-01: qty 50

## 2015-08-01 MED ORDER — METOCLOPRAMIDE HCL 5 MG/ML IJ SOLN
INTRAMUSCULAR | Status: DC | PRN
  Administered 2015-08-01: 10 mg via INTRAVENOUS

## 2015-08-01 MED ORDER — METOCLOPRAMIDE HCL 5 MG PO TABS
5.0000 mg | ORAL_TABLET | Freq: Three times a day (TID) | ORAL | Status: DC | PRN
  Filled 2015-08-01: qty 2

## 2015-08-01 MED ORDER — CEFAZOLIN SODIUM-DEXTROSE 2-3 GM-% IV SOLR
2.0000 g | Freq: Four times a day (QID) | INTRAVENOUS | Status: AC
  Administered 2015-08-02 (×3): 2 g via INTRAVENOUS
  Filled 2015-08-01 (×4): qty 50

## 2015-08-01 MED ORDER — CIPROFLOXACIN IN D5W 400 MG/200ML IV SOLN
400.0000 mg | Freq: Once | INTRAVENOUS | Status: AC
  Administered 2015-08-01: 400 mg via INTRAVENOUS

## 2015-08-01 MED ORDER — LACTATED RINGERS IV SOLN: INTRAVENOUS | Status: DC | PRN

## 2015-08-01 MED ORDER — CIPROFLOXACIN IN D5W 400 MG/200ML IV SOLN
INTRAVENOUS | Status: AC
  Filled 2015-08-01: qty 200

## 2015-08-01 MED ORDER — ADULT MULTIVITAMIN W/MINERALS CH
1.0000 | ORAL_TABLET | Freq: Every day | ORAL | Status: DC
  Administered 2015-08-02: 1 via ORAL
  Filled 2015-08-01: qty 1

## 2015-08-01 MED ORDER — ONDANSETRON HCL 4 MG/2ML IJ SOLN
INTRAMUSCULAR | Status: AC
  Filled 2015-08-01: qty 2

## 2015-08-01 MED ORDER — MORPHINE SULFATE (PF) 2 MG/ML IV SOLN
2.0000 mg | Freq: Once | INTRAVENOUS | Status: AC
  Administered 2015-08-01: 2 mg via INTRAVENOUS
  Filled 2015-08-01: qty 1

## 2015-08-01 MED ORDER — MIDAZOLAM HCL 5 MG/5ML IJ SOLN
INTRAMUSCULAR | Status: DC | PRN
  Administered 2015-08-01: 0.5 mg via INTRAVENOUS

## 2015-08-01 MED ORDER — DIAZEPAM 2 MG PO TABS
1.0000 mg | ORAL_TABLET | Freq: Every day | ORAL | Status: DC | PRN
  Administered 2015-08-02: 2 mg via ORAL
  Filled 2015-08-01 (×2): qty 1

## 2015-08-01 MED ORDER — SODIUM CHLORIDE 0.9 % IV SOLN
INTRAVENOUS | Status: DC
  Administered 2015-08-02: 01:00:00 via INTRAVENOUS

## 2015-08-01 MED ORDER — DEXAMETHASONE SODIUM PHOSPHATE 10 MG/ML IJ SOLN
INTRAMUSCULAR | Status: AC
  Filled 2015-08-01: qty 1

## 2015-08-01 MED ORDER — CEFAZOLIN SODIUM 1-5 GM-% IV SOLN
1.0000 g | Freq: Once | INTRAVENOUS | Status: AC
  Administered 2015-08-01: 1 g via INTRAVENOUS
  Filled 2015-08-01: qty 50

## 2015-08-01 MED ORDER — ACETAMINOPHEN 325 MG PO TABS
650.0000 mg | ORAL_TABLET | Freq: Four times a day (QID) | ORAL | Status: DC | PRN
  Administered 2015-08-02: 650 mg via ORAL
  Filled 2015-08-01: qty 2

## 2015-08-01 MED ORDER — LISINOPRIL 20 MG PO TABS
20.0000 mg | ORAL_TABLET | Freq: Every day | ORAL | Status: DC
  Administered 2015-08-02: 20 mg via ORAL
  Filled 2015-08-01: qty 1

## 2015-08-01 MED ORDER — SENNA 8.6 MG PO TABS
1.0000 | ORAL_TABLET | Freq: Two times a day (BID) | ORAL | Status: DC
  Administered 2015-08-02 (×2): 8.6 mg via ORAL

## 2015-08-01 MED ORDER — CEFAZOLIN SODIUM-DEXTROSE 2-3 GM-% IV SOLR
INTRAVENOUS | Status: DC | PRN
  Administered 2015-08-01: 2 g via INTRAVENOUS

## 2015-08-01 MED ORDER — ONDANSETRON HCL 4 MG PO TABS
4.0000 mg | ORAL_TABLET | Freq: Four times a day (QID) | ORAL | Status: DC | PRN
  Filled 2015-08-01: qty 1

## 2015-08-01 MED ORDER — TRAMADOL HCL 50 MG PO TABS
50.0000 mg | ORAL_TABLET | Freq: Four times a day (QID) | ORAL | Status: DC | PRN
  Administered 2015-08-02: 100 mg via ORAL
  Filled 2015-08-01: qty 2

## 2015-08-01 MED ORDER — 0.9 % SODIUM CHLORIDE (POUR BTL) OPTIME
TOPICAL | Status: DC | PRN
  Administered 2015-08-01: 1000 mL

## 2015-08-01 MED ORDER — PROPOFOL 10 MG/ML IV BOLUS
INTRAVENOUS | Status: AC
  Filled 2015-08-01: qty 20

## 2015-08-01 MED ORDER — LORATADINE 10 MG PO TABS
10.0000 mg | ORAL_TABLET | Freq: Every morning | ORAL | Status: DC
  Administered 2015-08-02: 10 mg via ORAL
  Filled 2015-08-01: qty 1

## 2015-08-01 MED ORDER — LACTATED RINGERS IV SOLN
INTRAVENOUS | Status: DC | PRN
  Administered 2015-08-01: 22:00:00 via INTRAVENOUS

## 2015-08-01 MED ORDER — METOCLOPRAMIDE HCL 5 MG/ML IJ SOLN: 5.0000 mg | Freq: Three times a day (TID) | INTRAMUSCULAR | Status: DC | PRN

## 2015-08-01 MED ORDER — PROPOFOL 10 MG/ML IV BOLUS
INTRAVENOUS | Status: DC | PRN
  Administered 2015-08-01: 200 mg via INTRAVENOUS

## 2015-08-01 MED ORDER — TETANUS-DIPHTH-ACELL PERTUSSIS 5-2.5-18.5 LF-MCG/0.5 IM SUSP
0.5000 mL | Freq: Once | INTRAMUSCULAR | Status: AC
Start: 2015-08-01 — End: 2015-08-01
  Administered 2015-08-01: 0.5 mL via INTRAMUSCULAR
  Filled 2015-08-01: qty 0.5

## 2015-08-01 MED ORDER — LIDOCAINE HCL (CARDIAC) 20 MG/ML IV SOLN
INTRAVENOUS | Status: AC
  Filled 2015-08-01: qty 5

## 2015-08-01 MED ORDER — ONDANSETRON HCL 4 MG/2ML IJ SOLN
4.0000 mg | Freq: Once | INTRAMUSCULAR | Status: AC
  Administered 2015-08-01: 4 mg via INTRAVENOUS
  Filled 2015-08-01: qty 2

## 2015-08-01 MED ORDER — CIPROFLOXACIN HCL 500 MG PO TABS
500.0000 mg | ORAL_TABLET | Freq: Two times a day (BID) | ORAL | Status: DC
  Administered 2015-08-02 (×2): 500 mg via ORAL
  Filled 2015-08-01 (×5): qty 1

## 2015-08-01 MED ORDER — FENTANYL CITRATE (PF) 250 MCG/5ML IJ SOLN
INTRAMUSCULAR | Status: AC
  Filled 2015-08-01: qty 25

## 2015-08-01 MED ORDER — LISINOPRIL-HYDROCHLOROTHIAZIDE 20-12.5 MG PO TABS: 2.0000 | ORAL_TABLET | Freq: Every morning | ORAL | Status: DC

## 2015-08-01 MED ORDER — ACETAMINOPHEN 650 MG RE SUPP
650.0000 mg | Freq: Four times a day (QID) | RECTAL | Status: DC | PRN
  Filled 2015-08-01: qty 1

## 2015-08-01 MED ORDER — FENTANYL CITRATE (PF) 100 MCG/2ML IJ SOLN
INTRAMUSCULAR | Status: DC | PRN
  Administered 2015-08-01 (×2): 50 ug via INTRAVENOUS
  Administered 2015-08-01 (×2): 25 ug via INTRAVENOUS

## 2015-08-01 MED ORDER — DOCUSATE SODIUM 100 MG PO CAPS
100.0000 mg | ORAL_CAPSULE | Freq: Two times a day (BID) | ORAL | Status: DC
  Administered 2015-08-02 (×2): 100 mg via ORAL

## 2015-08-01 MED ORDER — SODIUM CHLORIDE 0.9 % IV SOLN
INTRAVENOUS | Status: DC | PRN
  Administered 2015-08-01: 21:00:00 via INTRAVENOUS

## 2015-08-01 MED ORDER — SODIUM CHLORIDE 0.9 % IR SOLN
Status: DC | PRN
  Administered 2015-08-01: 3000 mL

## 2015-08-01 MED ORDER — ONDANSETRON HCL 4 MG/2ML IJ SOLN
INTRAMUSCULAR | Status: DC | PRN
  Administered 2015-08-01: 4 mg via INTRAVENOUS

## 2015-08-01 MED ORDER — ONDANSETRON HCL 4 MG/2ML IJ SOLN: 4.0000 mg | Freq: Once | INTRAMUSCULAR | Status: DC | PRN

## 2015-08-01 MED ORDER — CEFAZOLIN SODIUM-DEXTROSE 2-3 GM-% IV SOLR
2.0000 g | Freq: Once | INTRAVENOUS | Status: DC
  Filled 2015-08-01: qty 50

## 2015-08-01 MED ORDER — MIDAZOLAM HCL 2 MG/2ML IJ SOLN
INTRAMUSCULAR | Status: AC
  Filled 2015-08-01: qty 4

## 2015-08-01 MED ORDER — FLUTICASONE PROPIONATE 50 MCG/ACT NA SUSP: 1.0000 | Freq: Every day | NASAL | Status: DC | PRN

## 2015-08-01 MED ORDER — LIDOCAINE HCL (CARDIAC) 20 MG/ML IV SOLN
INTRAVENOUS | Status: DC | PRN
  Administered 2015-08-01: 100 mg via INTRAVENOUS

## 2015-08-01 MED ORDER — LABETALOL HCL 5 MG/ML IV SOLN
INTRAVENOUS | Status: DC | PRN
  Administered 2015-08-01: 2.5 mg via INTRAVENOUS

## 2015-08-01 MED ORDER — NEBIVOLOL HCL 5 MG PO TABS
5.0000 mg | ORAL_TABLET | Freq: Every day | ORAL | Status: DC
  Administered 2015-08-02: 5 mg via ORAL
  Filled 2015-08-01 (×2): qty 1

## 2015-08-01 MED ORDER — ACETAMINOPHEN 10 MG/ML IV SOLN
INTRAVENOUS | Status: DC | PRN
  Administered 2015-08-01: 1000 mg via INTRAVENOUS

## 2015-08-01 MED ORDER — HYDROCHLOROTHIAZIDE 12.5 MG PO CAPS
12.5000 mg | ORAL_CAPSULE | Freq: Every day | ORAL | Status: DC
  Administered 2015-08-02: 12.5 mg via ORAL
  Filled 2015-08-01: qty 1

## 2015-08-01 MED ORDER — ACETAMINOPHEN 10 MG/ML IV SOLN
INTRAVENOUS | Status: AC
  Filled 2015-08-01: qty 100

## 2015-08-01 MED ORDER — MELOXICAM 15 MG PO TABS
15.0000 mg | ORAL_TABLET | Freq: Every day | ORAL | Status: DC
  Administered 2015-08-02: 15 mg via ORAL
  Filled 2015-08-01: qty 1

## 2015-08-01 MED ORDER — DEXAMETHASONE SODIUM PHOSPHATE 10 MG/ML IJ SOLN
INTRAMUSCULAR | Status: DC | PRN
  Administered 2015-08-01: 10 mg via INTRAVENOUS

## 2015-08-01 SURGICAL SUPPLY — 39 items
BAG ZIPLOCK 12X15 (MISCELLANEOUS) IMPLANT
BANDAGE ELASTIC 4 VELCRO ST LF (GAUZE/BANDAGES/DRESSINGS) ×2 IMPLANT
BANDAGE ELASTIC 6 VELCRO ST LF (GAUZE/BANDAGES/DRESSINGS) ×2 IMPLANT
CUFF TOURN SGL QUICK 34 (TOURNIQUET CUFF) ×1
CUFF TRNQT CYL 34X4X40X1 (TOURNIQUET CUFF) ×1 IMPLANT
DRAPE EXTREMITY T 121X128X90 (DRAPE) ×2 IMPLANT
DRAPE LG THREE QUARTER DISP (DRAPES) ×2 IMPLANT
DRAPE POUCH INSTRU U-SHP 10X18 (DRAPES) ×2 IMPLANT
DRAPE U-SHAPE 47X51 STRL (DRAPES) ×2 IMPLANT
DRSG ADAPTIC 3X8 NADH LF (GAUZE/BANDAGES/DRESSINGS) ×2 IMPLANT
DRSG AQUACEL AG ADV 3.5X10 (GAUZE/BANDAGES/DRESSINGS) ×2 IMPLANT
DRSG PAD ABDOMINAL 8X10 ST (GAUZE/BANDAGES/DRESSINGS) ×4 IMPLANT
ELECT REM PT RETURN 15FT ADLT (MISCELLANEOUS) ×2 IMPLANT
FACESHIELD WRAPAROUND (MASK) ×4 IMPLANT
GAUZE SPONGE 4X4 12PLY STRL (GAUZE/BANDAGES/DRESSINGS) ×4 IMPLANT
GLOVE BIO SURGEON STRL SZ8.5 (GLOVE) ×6 IMPLANT
GLOVE BIOGEL PI IND STRL 8.5 (GLOVE) ×1 IMPLANT
GLOVE BIOGEL PI INDICATOR 8.5 (GLOVE) ×1
GOWN SPEC L3 XXLG W/TWL (GOWN DISPOSABLE) ×2 IMPLANT
HANDPIECE INTERPULSE COAX TIP (DISPOSABLE) ×1
IMMOBILIZER KNEE 20 (SOFTGOODS) ×2
IMMOBILIZER KNEE 20 THIGH 36 (SOFTGOODS) ×1 IMPLANT
KIT BASIN OR (CUSTOM PROCEDURE TRAY) ×2 IMPLANT
MANIFOLD NEPTUNE II (INSTRUMENTS) ×2 IMPLANT
PACK TOTAL JOINT (CUSTOM PROCEDURE TRAY) ×2 IMPLANT
PADDING CAST COTTON 6X4 STRL (CAST SUPPLIES) ×4 IMPLANT
POSITIONER SURGICAL ARM (MISCELLANEOUS) ×2 IMPLANT
SET HNDPC FAN SPRY TIP SCT (DISPOSABLE) ×1 IMPLANT
SET PAD KNEE POSITIONER (MISCELLANEOUS) ×2 IMPLANT
SOL PREP POV-IOD 4OZ 10% (MISCELLANEOUS) ×2 IMPLANT
STAPLER VISISTAT 35W (STAPLE) ×2 IMPLANT
SUT VIC AB 1 CT1 36 (SUTURE) ×2 IMPLANT
SUT VIC AB 2-0 CT1 27 (SUTURE) ×2
SUT VIC AB 2-0 CT1 TAPERPNT 27 (SUTURE) ×2 IMPLANT
SUT VLOC 180 0 24IN GS25 (SUTURE) IMPLANT
TOWEL OR 17X26 10 PK STRL BLUE (TOWEL DISPOSABLE) ×2 IMPLANT
TOWEL OR NON WOVEN STRL DISP B (DISPOSABLE) ×2 IMPLANT
TRAY FOLEY W/METER SILVER 14FR (SET/KITS/TRAYS/PACK) ×2 IMPLANT
WRAP KNEE MAXI GEL POST OP (GAUZE/BANDAGES/DRESSINGS) ×2 IMPLANT

## 2015-08-01 NOTE — Transfer of Care (Signed)
Immediate Anesthesia Transfer of Care Note  Patient: Bonnie Gray  Procedure(s) Performed: Procedure(s): DEBRIDEMENT OF LEFT KNEE WOUND WITH CLOSURE (Left)  Patient Location: PACU  Anesthesia Type:General  Level of Consciousness: awake, alert , oriented and patient cooperative  Airway & Oxygen Therapy: Patient Spontanous Breathing and Patient connected to face mask oxygen  Post-op Assessment: Report given to RN, Post -op Vital signs reviewed and stable and Patient moving all extremities X 4  Post vital signs: stable  Last Vitals:  Filed Vitals:   08/01/15 1749  BP: 180/79  Pulse: 70  Temp:   Resp:     Complications: No apparent anesthesia complications

## 2015-08-01 NOTE — Anesthesia Preprocedure Evaluation (Addendum)
Anesthesia Evaluation  Patient identified by MRN, date of birth, ID band Patient awake    Reviewed: Allergy & Precautions, H&P , NPO status , Patient's Chart, lab work & pertinent test results  Airway Mallampati: II  TM Distance: >3 FB Neck ROM: full    Dental  (+) Missing, Dental Advisory Given Many side teeth missing:   Pulmonary shortness of breath and with exertion,    Pulmonary exam normal breath sounds clear to auscultation       Cardiovascular Exercise Tolerance: Good hypertension, Pt. on medications and Pt. on home beta blockers Normal cardiovascular exam Rhythm:regular Rate:Normal  RBBB   Neuro/Psych  Headaches, Anxiety    GI/Hepatic negative GI ROS, Neg liver ROS,   Endo/Other  negative endocrine ROS  Renal/GU negative Renal ROS  negative genitourinary   Musculoskeletal  (+) Arthritis ,   Abdominal (+) + obese,   Peds  Hematology negative hematology ROS (+)   Anesthesia Other Findings   Reproductive/Obstetrics negative OB ROS                           Anesthesia Physical  Anesthesia Plan  ASA: III  Anesthesia Plan: General   Post-op Pain Management:    Induction: Intravenous  Airway Management Planned: LMA  Additional Equipment:   Intra-op Plan:   Post-operative Plan: Extubation in OR  Informed Consent: I have reviewed the patients History and Physical, chart, labs and discussed the procedure including the risks, benefits and alternatives for the proposed anesthesia with the patient or authorized representative who has indicated his/her understanding and acceptance.   Dental advisory given  Plan Discussed with: Surgeon, Anesthesiologist and CRNA  Anesthesia Plan Comments:        Anesthesia Quick Evaluation

## 2015-08-01 NOTE — Discharge Instructions (Signed)
Leave dressing intact until follow up Wear immobilizer while out of bed

## 2015-08-01 NOTE — ED Notes (Signed)
Bed: [facility] Expected date:  Expected time:  Means of arrival:  Comments: Ems-knee injury

## 2015-08-01 NOTE — Anesthesia Procedure Notes (Signed)
Procedure Name: Intubation Date/Time: 08/01/2015 9:17 PM Performed by: Lissa Morales Pre-anesthesia Checklist: Patient identified, Emergency Drugs available, Suction available and Patient being monitored Patient Re-evaluated:Patient Re-evaluated prior to inductionOxygen Delivery Method: Circle System Utilized Preoxygenation: Pre-oxygenation with 100% oxygen Intubation Type: IV induction Ventilation: Mask ventilation without difficulty LMA: LMA with gastric port inserted LMA Size: 4.0 Tube type: Oral Number of attempts: 1 Airway Equipment and Method: Oral airway Placement Confirmation: ETT inserted through vocal cords under direct vision,  positive ETCO2 and breath sounds checked- equal and bilateral (sump down  gatric port of  LMA) Tube secured with: Tape Dental Injury: Teeth and Oropharynx as per pre-operative assessment

## 2015-08-01 NOTE — ED Provider Notes (Signed)
CSN: [id number]     Arrival date & time 08/01/15  1731 History   First MD Initiated Contact with Patient 08/01/15 1732     No chief complaint on file.    (Consider location/radiation/quality/duration/timing/severity/associated sxs/prior Treatment) HPI Comments: Patient presents to the emergency department for evaluation after a fall. Patient reports that her dog ran into her and caused her to lose her balance. She fell landing on her left side. She landed on a small prick wall, injuring her left ribs, left elbow and left knee. Patient reports that she recently had left knee replacement. She was seen by Dr. Wynelle Link this morning and was told everything looked fine. She reports that when she fell and hit her knee part of the incision opened up. EMS report a large amount of blood loss from the knee wound. Patient complaining of moderate pain in all sites that worsens with movement. She did not hit her head. There was no loss of consciousness. Patient denies neck and back pain.   Past Medical History  Diagnosis Date  . Hypertension   . Diarrhea   . Diverticulosis   . Anxiety   . Anemia     as a child  . Cataract     right  . Right bundle branch block   . History of blood transfusion   . Shortness of breath dyspnea     "because of the weight"/01/02/15- states unchanged  . Diverticulitis   . Chronic pain   . GERD (gastroesophageal reflux disease)   . Headache     migraines  . Macular degeneration     right   . Impaired vision     right eye   . Wears dentures   . Hemorrhoids   . History of urinary tract infection   . Osteoarthritis     knees; degenerative disc disease  . History of measles   . History of mumps   . History of rubella   . Menopause    Past Surgical History  Procedure Laterality Date  . Knee arthroscopy Right     2009  . Foot surgery      bunion and growth in arch. ( left foot)  . Colonoscopy  4 years ago  . Esophagogastroduodenoscopy  4 years ago  . Maloney  dilation  01/16/2012    Procedure: MALONEY DILATION;  Surgeon: Rogene Houston, MD;  Location: [facility];  Service: Endoscopy;  Laterality: N/A;  . Total abdominal hysterectomy  1983    endometriosis  . Appendectomy  1956  . Cholecystectomy  1995  . Nasal sinus surgery  1979  . Cataract extraction Left 2010  . Eye surgery Left     cataract extraction with IOL  . 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 Injection knee Left 01/09/2015    Procedure: KNEE INJECTION;  Surgeon: Gaynelle Arabian, MD;  Location: [facility];  Service: Orthopedics;  Laterality: Left;  . Dilation and curettage of uterus    . Total knee arthroplasty Left 06/26/2015    Procedure: LEFT TOTAL KNEE ARTHROPLASTY;  Surgeon: Gaynelle Arabian, MD;  Location: [facility];  Service: Orthopedics;  Laterality: Left;   Family History  Problem Relation Age of Onset  . Colon cancer Neg Hx    Social History  Substance Use Topics  . Smoking status: Never Smoker   . Smokeless tobacco: Never Used  .  Alcohol Use: Yes     Comment: drinks beer and wine only occas per week    OB History    No data available     Review of Systems  Musculoskeletal: Positive for arthralgias.  Skin: Positive for wound.  All other systems reviewed and are negative.     Allergies  Exforge; Oxycodone hcl; Norvasc; Caduet; Celebrex; and Flexeril  Home Medications   Prior to Admission medications   Medication Sig Start Date End Date Taking? Authorizing Provider  beta carotene w/minerals (OCUVITE) tablet Take 1 tablet by mouth every morning.     Historical Provider, MD  diazepam (VALIUM) 10 MG tablet Take 2 mg by mouth 2 (two) times daily as needed for anxiety (mucsle spasm.).     Historical Provider, MD  fluticasone (FLONASE) 50 MCG/ACT nasal spray Place 1 spray into both nostrils daily as needed for allergies or rhinitis.     Historical Provider, MD  guaiFENesin-codeine (ROBITUSSIN AC) 100-10 MG/5ML syrup Take 15 mLs by mouth 2 (two) times daily as needed for cough or congestion.     Historical Provider, MD  HYDROmorphone (DILAUDID) 2 MG tablet Take 1-2 tablets (2-4 mg total) by mouth every 4 (four) hours as needed for moderate pain or severe pain. 06/27/15   [name], PA-C  lisinopril-hydrochlorothiazide (PRINZIDE,ZESTORETIC) 20-12.5 MG per tablet Take 2 tablets by mouth every morning.    Historical Provider, MD  loratadine (CLARITIN) 10 MG tablet Take 10 mg by mouth every morning.     Historical Provider, MD  methocarbamol (ROBAXIN) 500 MG tablet Take 1 tablet (500 mg total) by mouth every 6 (six) hours as needed for muscle spasms. 06/27/15   [name], PA-C  Multiple Vitamins-Minerals (PRESERVISION AREDS PO) Take 1 tablet by mouth daily.    Historical Provider, MD  nebivolol (BYSTOLIC) 5 MG tablet Take 5 mg by mouth at bedtime.    Historical Provider, MD  Polyethyl Glycol-Propyl Glycol (SYSTANE OP) Apply 1 drop to eye 2 (two) times daily as needed (Dry eyes).    Historical Provider, MD  rivaroxaban (XARELTO) 10 MG TABS tablet Take 1 tablet (10 mg total) by mouth daily with breakfast. Take Xarelto for two and a half more weeks, then discontinue Xarelto. Once the patient has completed the Xarelto, they may resume the 81 mg Aspirin. 06/27/15   [name], PA-C  traMADol (ULTRAM) 50 MG tablet Take 1-2 tablets (50-100 mg total) by mouth every 6 (six) hours as needed (mild pain). 06/27/15   [name], PA-C   There were no vitals taken for this visit. Physical Exam  Constitutional: She is oriented to person, place, and time. She appears well-developed and well-nourished. No distress.  HENT:  Head: Normocephalic and atraumatic.  Right Ear: Hearing normal.  Left Ear: Hearing normal.  Nose: Nose normal.  Mouth/Throat: Oropharynx is clear and moist and mucous membranes are normal.  Eyes: Conjunctivae and EOM are  normal. Pupils are equal, round, and reactive to light.  Neck: Normal range of motion. Neck supple.  Cardiovascular: Regular rhythm, S1 normal and S2 normal.  Exam reveals no gallop and no friction rub.   No murmur heard. Pulmonary/Chest: Effort normal and breath sounds normal. No respiratory distress. She exhibits tenderness. She exhibits no crepitus.    Abdominal: Soft. Normal appearance and bowel sounds are normal. There is no hepatosplenomegaly. There is no tenderness. There is no rebound, no guarding, no tenderness at McBurney's point and negative Murphy's sign. No hernia.  Musculoskeletal: Normal range of motion.  Left elbow: She exhibits swelling. She exhibits normal range of motion and no deformity. Tenderness found. Olecranon process tenderness noted.       Thoracic back: Normal.       Lumbar back: Normal.  Neurological: She is alert and oriented to person, place, and time. She has normal strength. No cranial nerve deficit or sensory deficit. Coordination normal. GCS eye subscore is 4. GCS verbal subscore is 5. GCS motor subscore is 6.  Skin: Skin is warm, dry and intact. No rash noted. No cyanosis.  Psychiatric: She has a normal mood and affect. Her speech is normal and behavior is normal. Thought content normal.  Nursing note and vitals reviewed.      ED Course  Procedures (including critical care time) Labs Review Labs Reviewed  CBC  BASIC METABOLIC PANEL  PROTIME-INR    Imaging Review No results found. I have personally reviewed and evaluated these images and lab results as part of my medical decision-making.   EKG Interpretation None      MDM   Final diagnoses:  None   postoperative wound dehiscence  Patient presents to the ER after a fall. Patient fell when her dog ran into her and knocked her over. She landed on her left side. Patient complaining of pain in the left ribs, left elbow from the fall. X-ray of these areas was negative. Patient also,  however, had complete dehiscence of her surgical wound in the left knee from knee replacement on September 20. X-ray of the knee did not show any disruption of the prosthesis. Discussed with Dr. Ralene Bathe, on call for orthopedics. He will take the patient to the OR for repair. Tetanus updated. Patient administered preoperative Ancef.    Orpah Greek, MD 08/01/15 1929

## 2015-08-01 NOTE — ED Notes (Signed)
Unsuccessful blood draw attempt; phlebotomy aware.

## 2015-08-01 NOTE — H&P (Signed)
ORTHOPAEDIC ADMISSION HISTORY & PHYSICAL  REQUESTING PHYSICIAN: Orpah Greek, MD  PCP:  Ranae Palms, MD  Chief Complaint: left knee wound dehiscence  HPI: Bonnie Gray is a 74 y.o. female who saw Dr. Wynelle Link earlier today for routine postop visit. 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 Was doing great until earlier this evening when her dog knocked her over, causing her to fall and hit her L knee on a brick. Had opening of incision and bleeding. Denies other injuries. Last PO 1100.  Past Medical History  Diagnosis Date  . Hypertension   . Diarrhea   . Diverticulosis   . Anxiety   . Anemia     as a child  . Cataract     right  . Right bundle branch block   . History of blood transfusion   . Shortness of breath dyspnea     "because of the weight"/01/02/15- states unchanged  . Diverticulitis   . Chronic pain   . GERD (gastroesophageal reflux disease)   . Headache     migraines  . Macular degeneration     right   . Impaired vision     right eye   . Wears dentures   . Hemorrhoids   . History of urinary tract infection   . Osteoarthritis     knees; degenerative disc disease  . History of measles   . History of mumps   . History of rubella   . Menopause    Past Surgical History  Procedure Laterality Date  . Knee arthroscopy Right     2009  . Foot surgery      bunion and growth in arch. ( left foot)  . Colonoscopy  4 years ago  . Esophagogastroduodenoscopy  4 years ago  . Maloney dilation  01/16/2012    Procedure: MALONEY DILATION;  Surgeon: Rogene Houston, MD;  Location: [facility];  Service: Endoscopy;  Laterality: N/A;  . Total abdominal hysterectomy  1983    endometriosis  . Appendectomy  1956  . Cholecystectomy  1995  . Nasal sinus surgery  1979  . Cataract extraction Left 2010  . Eye surgery Left     cataract extraction with IOL  . Total knee arthroplasty Right 01/09/2015    Procedure: RIGHT TOTAL KNEE ARTHROPLASTY;  Surgeon: Gaynelle Arabian, MD;   Location: [facility];  Service: Orthopedics;  Laterality: Right;  . Injection knee Left 01/09/2015    Procedure: KNEE INJECTION;  Surgeon: Gaynelle Arabian, MD;  Location: [facility];  Service: Orthopedics;  Laterality: Left;  . Dilation and curettage of uterus    . Total knee arthroplasty Left 06/26/2015    Procedure: LEFT TOTAL KNEE ARTHROPLASTY;  Surgeon: Gaynelle Arabian, MD;  Location: [facility];  Service: Orthopedics;  Laterality: Left;   Social History   Social History  . Marital Status: Divorced    Spouse Name: N/A  . Number of Children: N/A  . Years of Education: N/A   Social History Main Topics  . Smoking status: Never Smoker   . Smokeless tobacco: Never Used  . Alcohol Use: Yes     Comment: drinks beer and wine only occas per week   . Drug Use: No  . Sexual Activity: Not Asked   Other Topics Concern  . None   Social History Narrative   Family History  Problem Relation Age of Onset  . Colon cancer Neg Hx    Allergies  Allergen Reactions  . Exforge [Amlodipine Besylate-Valsartan] Other (  See Comments)    Flush;headache  . Oxycodone Hcl Other (See Comments)    Hallucinations  . Dilaudid [Hydromorphone Hcl] Other (See Comments)    Fell asleep standing up  . Norvasc [Amlodipine Besylate]     Rash  . Robaxin [Methocarbamol] Nausea Only and Other (See Comments)    depression  . Caduet [Amlodipine-Atorvastatin] Rash  . Celebrex [Celecoxib]     hives  . Flexeril [Cyclobenzaprine] Rash   Prior to Admission medications   Medication Sig Start Date End Date Taking? Authorizing Provider  acetaminophen (TYLENOL) 500 MG tablet Take 1,000 mg by mouth every 6 (six) hours as needed for mild pain or headache.   Yes Historical Provider, MD  Alum Hydroxide-Mag Carbonate (GAVISCON PO) Take 1 tablet by mouth 2 (two) times daily as needed (indigestion).   Yes Historical Provider, MD  aspirin EC 81 MG tablet Take 81 mg by mouth daily.   Yes Historical Provider, MD  beta carotene w/minerals  (OCUVITE) tablet Take 1 tablet by mouth every morning.    Yes Historical Provider, MD  cephALEXin (KEFLEX) 500 MG capsule Take 500 mg by mouth 2 (two) times daily. 07/22/15 08/05/15 Yes Historical Provider, MD  diazepam (VALIUM) 2 MG tablet Take 1-2 mg by mouth daily as needed for anxiety.  07/22/15  Yes Historical Provider, MD  fluticasone (FLONASE) 50 MCG/ACT nasal spray Place 1 spray into both nostrils daily as needed for allergies or rhinitis.   Yes Historical Provider, MD  guaiFENesin-codeine (ROBITUSSIN AC) 100-10 MG/5ML syrup Take 15 mLs by mouth 2 (two) times daily as needed for cough or congestion.    Yes Historical Provider, MD  lisinopril-hydrochlorothiazide (PRINZIDE,ZESTORETIC) 20-12.5 MG per tablet Take 2 tablets by mouth every morning.   Yes Historical Provider, MD  loratadine (CLARITIN) 10 MG tablet Take 10 mg by mouth every morning.    Yes Historical Provider, MD  meloxicam (MOBIC) 15 MG tablet Take 15 mg by mouth daily.   Yes Historical Provider, MD  Multiple Vitamin (MULTIVITAMIN WITH MINERALS) TABS tablet Take 1 tablet by mouth daily.   Yes Historical Provider, MD  Multiple Vitamins-Minerals (PRESERVISION AREDS PO) Take 1 tablet by mouth daily.   Yes Historical Provider, MD  nebivolol (BYSTOLIC) 5 MG tablet Take 5 mg by mouth at bedtime.   Yes Historical Provider, MD  ondansetron (ZOFRAN) 4 MG tablet Take 4 mg by mouth every 8 (eight) hours as needed for nausea or vomiting.   Yes Historical Provider, MD  Polyethyl Glycol-Propyl Glycol (SYSTANE OP) Apply 1 drop to eye 2 (two) times daily as needed (Dry eyes).   Yes Historical Provider, MD  shark liver oil-cocoa butter (PREPARATION H) 0.25-3-85.5 % suppository Place 1 suppository rectally as needed for hemorrhoids.   Yes Historical Provider, MD  traMADol (ULTRAM) 50 MG tablet Take 1-2 tablets (50-100 mg total) by mouth every 6 (six) hours as needed (mild pain). 06/27/15  Yes [name], PA-C  HYDROmorphone (DILAUDID) 2 MG tablet  Take 1-2 tablets (2-4 mg total) by mouth every 4 (four) hours as needed for moderate pain or severe pain. 06/27/15   [name], PA-C  methocarbamol (ROBAXIN) 500 MG tablet Take 1 tablet (500 mg total) by mouth every 6 (six) hours as needed for muscle spasms. 06/27/15   [name], PA-C  rivaroxaban (XARELTO) 10 MG TABS tablet Take 1 tablet (10 mg total) by mouth daily with breakfast. Take Xarelto for two and a half more weeks, then discontinue Xarelto. Once the patient has completed the Xarelto,  they may resume the 81 mg Aspirin. 06/27/15   [name], PA-C   Dg Ribs Unilateral W/chest Left  08/01/2015  CLINICAL DATA:  [place] on left side and complains of left rib pain. EXAM: LEFT RIBS AND CHEST - 3+ VIEW COMPARISON:  Chest radiograph 01/10/2015 FINDINGS: Stable appearance of the heart and mediastinum. Negative for a pneumothorax. No evidence for a displaced left rib fracture. Lungs are clear without airspace disease or pulmonary edema. IMPRESSION: No acute abnormality. Electronically Signed   By: Markus Daft M.D.   On: 08/01/2015 18:20   Dg Elbow Complete Left  08/01/2015  CLINICAL DATA:  Per EMS, pt was working in her garden when her dog ran up to her and caused her to fall on a concrete path surrounded by bricks; pt fell on left side and is c/o left rib, left elbow, and left knee pain; pt had left knee surgery EXAM: LEFT ELBOW - COMPLETE 3+ VIEW COMPARISON:  None. FINDINGS: Marked narrowing of the articular cartilage with subchondral sclerosis in the distal humerus, radial head and coronoid process. Small corticated ossicles project lateral to the lateral condyle. Spurring from the coronoid process of the ulna and or from the radial head. No definite acute fracture. No effusion is evident. IMPRESSION: 1. Advanced degenerative changes without fracture, dislocation, or other acute abnormality. Electronically Signed   By: Lucrezia Europe M.D.   On: 08/01/2015 18:18   Dg Knee Complete 4 Views  Left  08/01/2015  CLINICAL DATA:  Per EMS, pt was working in her garden when her dog ran up to her and caused her to fall on a concrete path surrounded by bricks; pt fell on left side and is c/o left rib, left elbow, and left knee pain; pt had left knee surgery in September and the incision has split open; EMS states blood loss was present; posterior left elbow pain. Left knee pain all over. Left anterior lower rib pain. EXAM: LEFT KNEE - COMPLETE 4+ VIEW COMPARISON:  None. FINDINGS: No convincing acute fracture. Knee prosthetic components are well-seated and aligned with no evidence of loosening. Possible small joint effusion, somewhat equivocal. Is soft tissue edema that predominates anteriorly. IMPRESSION: 1. No fracture dislocation. 2. No evidence of loosening of the knee prosthetic components. 3. Possible small joint effusion. Electronically Signed   By: Lajean Manes M.D.   On: 08/01/2015 18:20    Positive ROS: All other systems have been reviewed and were otherwise negative with the exception of those mentioned in the HPI and as above.  Physical Exam: General: Alert, no acute distress Cardiovascular: No pedal edema Respiratory: No cyanosis, no use of accessory musculature GI: No organomegaly, abdomen is soft and non-tender Skin: No lesions in the area of chief complaint Neurologic: Sensation intact distally Psychiatric: Patient is competent for consent with normal mood and affect Lymphatic: No axillary or cervical lymphadenopathy  MUSCULOSKELETAL:  LLE: dehiscence of incision. Extensor mechanism visible and appears intact. Able to SLR. NVI distally.  Assessment: Traumatic dehiscence of L TKA incision  Plan: To OR for I&D and closure of L knee wound Will admit for IV abx NPO    Camara Renstrom, Horald Pollen, MD Cell [phone]    08/01/2015 7:55 PM

## 2015-08-01 NOTE — Anesthesia Postprocedure Evaluation (Signed)
  Anesthesia Post-op Note  Patient: Bonnie Gray  Procedure(s) Performed: Procedure(s) (LRB): DEBRIDEMENT OF LEFT KNEE WOUND WITH CLOSURE (Left)  Patient Location: PACU  Anesthesia Type: General  Level of Consciousness: awake and alert   Airway and Oxygen Therapy: Patient Spontanous Breathing  Post-op Pain: mild  Post-op Assessment: Post-op Vital signs reviewed, Patient's Cardiovascular Status Stable, Respiratory Function Stable, Patent Airway and No signs of Nausea or vomiting  Last Vitals:  Filed Vitals:   08/01/15 2315  BP: 140/62  Pulse: 67  Temp: 36.8 C  Resp: 12    Post-op Vital Signs: stable   Complications: No apparent anesthesia complications

## 2015-08-01 NOTE — ED Notes (Signed)
Blood consent signed, did not see an order for surgical procedure in chart: order set or discharge. Pt admitted that Dr.Swindel was at bedside and spoke to her about procedure, Initials on LLE noted. Abigail Butts in [place] aware of consents signed (blood transfusion) and unsigned (surgical).

## 2015-08-01 NOTE — ED Notes (Signed)
Pt BIB EMS; Per EMS, pt was working in her garden when her dog ran up to her and caused her to fall on a concrete path surrounded by bricks; pt fell on left side and is c/o left rib, left elbow, and left knee pain; pt had left knee surgery in September and the incision has split open; EMS states blood loss was present; Pt A&O x4; NAD noted

## 2015-08-01 NOTE — Brief Op Note (Signed)
08/01/2015  10:31 PM  PATIENT:  Bonnie Gray  74 y.o. female  PRE-OPERATIVE DIAGNOSIS:  Left knee open wound status post total joint  POST-OPERATIVE DIAGNOSIS:  Left knee open wound status post total joint  PROCEDURE:  Procedure(s): DEBRIDEMENT OF LEFT KNEE WOUND WITH CLOSURE (Left)  SURGEON:  Surgeon(s) and Role:    * Rod Can, MD - Primary  PHYSICIAN ASSISTANT: none  ASSISTANTS: none   ANESTHESIA:   general  EBL:  Total I/O In: 500 [I.V.:500] Out: 575 [Urine:450; Blood:125]  BLOOD ADMINISTERED:none  DRAINS: none   LOCAL MEDICATIONS USED:  NONE  SPECIMEN:  No Specimen  DISPOSITION OF SPECIMEN:  N/A  COUNTS:  YES  TOURNIQUET:    DICTATION: .Other Dictation: Dictation Number [id number]  PLAN OF CARE: Admit for overnight observation  PATIENT DISPOSITION:  PACU - hemodynamically stable.   Delay start of Pharmacological VTE agent (>24hrs) due to surgical blood loss or risk of bleeding: not applicable

## 2015-08-01 NOTE — ED Notes (Signed)
Bed: WA11 Expected date:  Expected time:  Means of arrival:  Comments: Hold for hall c 

## 2015-08-02 ENCOUNTER — Encounter (HOSPITAL_COMMUNITY): Payer: Self-pay | Admitting: Orthopedic Surgery

## 2015-08-02 NOTE — Progress Notes (Signed)
Nutrition Brief Note  Patient identified on the Malnutrition Screening Tool (MST) Report  Patient with 10 lb weight loss x 3 weeks (5% x 3 weeks, significant for time frame). Patient reports having gained weight earlier in the year from previous surgeries and prednisone use.  Pt reports improved appetite and is eating well. Pt states she tries to watch her salt intake at home and eats regularly. Pt may or may not include protein foods with each meal. Reviewed protein foods with patient and encouraged high protein snacks in between meals during this period of healing from her surgery.  Wt Readings from Last 15 Encounters:  08/01/15 200 lb (90.719 kg)  06/26/15 210 lb 15.7 oz (95.7 kg)  06/20/15 211 lb (95.709 kg)  01/09/15 202 lb (91.627 kg)  01/02/15 202 lb (91.627 kg)  11/24/14 203 lb (92.08 kg)  11/17/14 203 lb 8 oz (92.307 kg)  11/15/14 204 lb (92.534 kg)  01/16/12 194 lb (87.998 kg)  01/14/12 194 lb 1.6 oz (88.043 kg)    Body mass index is 33.28 kg/(m^2). Patient meets criteria for obesity based on current BMI.   Current diet order is regular, patient is consuming approximately 100% of meals at this time. Labs and medications reviewed.   No nutrition interventions warranted at this time. If nutrition issues arise, please consult RD.   Clayton Bibles, MS, RD, LDN Pager: [phone] After Hours Pager: [phone]

## 2015-08-02 NOTE — Progress Notes (Signed)
OT Cancellation Note  Patient Details Name: Bonnie Gray MRN: [id number] DOB: 1941-08-02   Cancelled Treatment:    Reason Eval/Treat Not Completed: OT screened, no needs identified, will sign off.  Pt has DME, AE and will have daughter's assistance.  No OT needs/concerns. Will sign off.   Radek Carnero 08/02/2015, 1:44 PM  Lesle Chris, OTR/L [phone] 08/02/2015

## 2015-08-02 NOTE — Discharge Summary (Signed)
Physician Discharge Summary  Patient ID: Bonnie Gray MRN: [id number] DOB/AGE: 1941/03/25 74 y.o.  Admit date: 08/01/2015 Discharge date: 08/02/2015  Admission Diagnoses:  Wound dehiscence  Discharge Diagnoses:  Principal Problem:   Wound dehiscence   Past Medical History  Diagnosis Date  . Hypertension   . Diarrhea   . Diverticulosis   . Anxiety   . Anemia     as a child  . Cataract     right  . Right bundle branch block   . History of blood transfusion   . Shortness of breath dyspnea     "because of the weight"/01/02/15- states unchanged  . Diverticulitis   . Chronic pain   . GERD (gastroesophageal reflux disease)   . Headache     migraines  . Macular degeneration     right   . Impaired vision     right eye   . Wears dentures   . Hemorrhoids   . History of urinary tract infection   . Osteoarthritis     knees; degenerative disc disease  . History of measles   . History of mumps   . History of rubella   . Menopause     Surgeries: Procedure(s): DEBRIDEMENT OF LEFT KNEE WOUND WITH CLOSURE on 08/01/2015   Consultants (if any): Treatment Team:  Rod Can, MD  Discharged Condition: Improved  Hospital Course: Bonnie Gray is an 74 y.o. female who was admitted 08/01/2015 with a diagnosis of Wound dehiscence and went to the operating room on 08/01/2015 and underwent the above named procedures.    She was given perioperative antibiotics:      Anti-infectives    Start     Dose/Rate Route Frequency Ordered Stop   08/02/15 0200  ceFAZolin (ANCEF) IVPB 2 g/50 mL premix     2 g 100 mL/hr over 30 Minutes Intravenous Every 6 hours 08/01/15 2336 08/02/15 1959   08/02/15 0000  ciprofloxacin (CIPRO) tablet 500 mg     500 mg Oral 2 times daily 08/01/15 2336 08/04/15 1959   08/01/15 2215  ceFAZolin (ANCEF) IVPB 2 g/50 mL premix  Status:  Discontinued     2 g 100 mL/hr over 30 Minutes Intravenous  Once 08/01/15 2204 08/01/15 2336   08/01/15 2145   ciprofloxacin (CIPRO) IVPB 400 mg     400 mg 200 mL/hr over 60 Minutes Intravenous  Once 08/01/15 2135 08/01/15 2100   08/01/15 1930  ceFAZolin (ANCEF) IVPB 1 g/50 mL premix     1 g 100 mL/hr over 30 Minutes Intravenous  Once 08/01/15 1925 08/01/15 2045    .  She was given sequential compression devices, early ambulation, and ASA for DVT prophylaxis.  She benefited maximally from the hospital stay and there were no complications.    Recent vital signs:  Filed Vitals:   08/02/15 0609  BP: 136/62  Pulse: 68  Temp: 97.7 F (36.5 C)  Resp: 12    Recent laboratory studies:  Lab Results  Component Value Date   HGB 10.5* 08/01/2015   HGB 9.8* 06/28/2015   HGB 11.3* 06/27/2015   Lab Results  Component Value Date   WBC 12.5* 08/01/2015   PLT 291 08/01/2015   Lab Results  Component Value Date   INR 1.07 08/01/2015   Lab Results  Component Value Date   NA 133* 08/01/2015   K 3.6 08/01/2015   CL 97* 08/01/2015   CO2 27 08/01/2015   BUN 9 08/01/2015   CREATININE 0.55 08/01/2015  GLUCOSE 130* 08/01/2015    Discharge Medications:     Medication List    STOP taking these medications        HYDROmorphone 2 MG tablet  Commonly known as:  DILAUDID     methocarbamol 500 MG tablet  Commonly known as:  ROBAXIN     rivaroxaban 10 MG Tabs tablet  Commonly known as:  XARELTO      TAKE these medications        acetaminophen 500 MG tablet  Commonly known as:  TYLENOL  Take 1,000 mg by mouth every 6 (six) hours as needed for mild pain or headache.     aspirin EC 81 MG tablet  Take 81 mg by mouth daily.     PRESERVISION AREDS PO  Take 1 tablet by mouth daily.     beta carotene w/minerals tablet  Take 1 tablet by mouth every morning.     cephALEXin 500 MG capsule  Commonly known as:  KEFLEX  Take 500 mg by mouth 2 (two) times daily.     diazepam 2 MG tablet  Commonly known as:  VALIUM  Take 1-2 mg by mouth daily as needed for anxiety.     fluticasone 50  MCG/ACT nasal spray  Commonly known as:  FLONASE  Place 1 spray into both nostrils daily as needed for allergies or rhinitis.     GAVISCON PO  Take 1 tablet by mouth 2 (two) times daily as needed (indigestion).     guaiFENesin-codeine 100-10 MG/5ML syrup  Commonly known as:  ROBITUSSIN AC  Take 15 mLs by mouth 2 (two) times daily as needed for cough or congestion.     lisinopril-hydrochlorothiazide 20-12.5 MG tablet  Commonly known as:  PRINZIDE,ZESTORETIC  Take 2 tablets by mouth every morning.     loratadine 10 MG tablet  Commonly known as:  CLARITIN  Take 10 mg by mouth every morning.     meloxicam 15 MG tablet  Commonly known as:  MOBIC  Take 15 mg by mouth daily.     multivitamin with minerals Tabs tablet  Take 1 tablet by mouth daily.     nebivolol 5 MG tablet  Commonly known as:  BYSTOLIC  Take 5 mg by mouth at bedtime.     ondansetron 4 MG tablet  Commonly known as:  ZOFRAN  Take 4 mg by mouth every 8 (eight) hours as needed for nausea or vomiting.     shark liver oil-cocoa butter 0.25-3-85.5 % suppository  Commonly known as:  PREPARATION H  Place 1 suppository rectally as needed for hemorrhoids.     SYSTANE OP  Apply 1 drop to eye 2 (two) times daily as needed (Dry eyes).     traMADol 50 MG tablet  Commonly known as:  ULTRAM  Take 1-2 tablets (50-100 mg total) by mouth every 6 (six) hours as needed (mild pain).        Diagnostic Studies: Dg Ribs Unilateral W/chest Left  08/01/2015  CLINICAL DATA:  [place] on left side and complains of left rib pain. EXAM: LEFT RIBS AND CHEST - 3+ VIEW COMPARISON:  Chest radiograph 01/10/2015 FINDINGS: Stable appearance of the heart and mediastinum. Negative for a pneumothorax. No evidence for a displaced left rib fracture. Lungs are clear without airspace disease or pulmonary edema. IMPRESSION: No acute abnormality. Electronically Signed   By: Markus Daft M.D.   On: 08/01/2015 18:20   Dg Elbow Complete Left  08/01/2015   CLINICAL DATA:  Per EMS,  pt was working in her garden when her dog ran up to her and caused her to fall on a concrete path surrounded by bricks; pt fell on left side and is c/o left rib, left elbow, and left knee pain; pt had left knee surgery EXAM: LEFT ELBOW - COMPLETE 3+ VIEW COMPARISON:  None. FINDINGS: Marked narrowing of the articular cartilage with subchondral sclerosis in the distal humerus, radial head and coronoid process. Small corticated ossicles project lateral to the lateral condyle. Spurring from the coronoid process of the ulna and or from the radial head. No definite acute fracture. No effusion is evident. IMPRESSION: 1. Advanced degenerative changes without fracture, dislocation, or other acute abnormality. Electronically Signed   By: Lucrezia Europe M.D.   On: 08/01/2015 18:18   Dg Knee Complete 4 Views Left  08/01/2015  CLINICAL DATA:  Per EMS, pt was working in her garden when her dog ran up to her and caused her to fall on a concrete path surrounded by bricks; pt fell on left side and is c/o left rib, left elbow, and left knee pain; pt had left knee surgery in September and the incision has split open; EMS states blood loss was present; posterior left elbow pain. Left knee pain all over. Left anterior lower rib pain. EXAM: LEFT KNEE - COMPLETE 4+ VIEW COMPARISON:  None. FINDINGS: No convincing acute fracture. Knee prosthetic components are well-seated and aligned with no evidence of loosening. Possible small joint effusion, somewhat equivocal. Is soft tissue edema that predominates anteriorly. IMPRESSION: 1. No fracture dislocation. 2. No evidence of loosening of the knee prosthetic components. 3. Possible small joint effusion. Electronically Signed   By: Lajean Manes M.D.   On: 08/01/2015 18:20    Disposition: 06-Home-Health Care Svc  Discharge Instructions    Call MD / Call 911    Complete by:  As directed   If you experience chest pain or shortness of breath, CALL 911 and be  transported to the hospital emergency room.  If you develope a fever above 101 F, pus (white drainage) or increased drainage or redness at the wound, or calf pain, call your surgeon's office.     Constipation Prevention    Complete by:  As directed   Drink plenty of fluids.  Prune juice may be helpful.  You may use a stool softener, such as Colace (over the counter) 100 mg twice a day.  Use MiraLax (over the counter) for constipation as needed.     Diet - low sodium heart healthy    Complete by:  As directed      Discharge instructions    Complete by:  As directed   Wear knee immobilizer while up Keep dressing clean and dry     Driving restrictions    Complete by:  As directed   No driving for 2 weeks     Increase activity slowly as tolerated    Complete by:  As directed      Lifting restrictions    Complete by:  As directed   No lifting for 2 weeks           Follow-up Information    Follow up with Jaely Silman, Horald Pollen, MD. Schedule an appointment as soon as possible for a visit in 2 weeks.   Specialty:  Orthopedic Surgery   Why:  For suture removal   Contact information:   [address]  [place] [address] [phone]  Signed: Elie Goody 08/02/2015, 7:04 AM

## 2015-08-02 NOTE — Progress Notes (Signed)
   Subjective:  Patient reports pain as mild.  No c/o.  Objective:   VITALS:   Filed Vitals:   08/02/15 0015 08/02/15 0115 08/02/15 0210 08/02/15 0609  BP: 145/64 148/67 154/57 136/62  Pulse: 67 65 68 68  Temp: 98.1 F (36.7 C) 97.7 F (36.5 C) 98 F (36.7 C) 97.7 F (36.5 C)  TempSrc: Oral Oral Oral Oral  Resp: 12 12 14 12   Height:      Weight:      SpO2: 99% 98% 98% 97%    Sensation intact distally Intact pulses distally Dorsiflexion/Plantar flexion intact Incision: dressing C/D/I KI intact  Lab Results  Component Value Date   WBC 12.5* 08/01/2015   HGB 10.5* 08/01/2015   HCT 32.3* 08/01/2015   MCV 85.0 08/01/2015   PLT 291 08/01/2015   BMET    Component Value Date/Time   NA 133* 08/01/2015 1906   K 3.6 08/01/2015 1906   CL 97* 08/01/2015 1906   CO2 27 08/01/2015 1906   GLUCOSE 130* 08/01/2015 1906   BUN 9 08/01/2015 1906   CREATININE 0.55 08/01/2015 1906   CALCIUM 9.0 08/01/2015 1906   GFRNONAA >60 08/01/2015 1906   GFRAA >60 08/01/2015 1906     Assessment/Plan: 1 Day Post-Op   Active Problems:   Wound dehiscence   WBAT in KI Advance diet Up with therapy D/C IV fluids ASA 81 Complete IV ancef then D/C home   Zerrick Hanssen, Horald Pollen 08/02/2015, 6:59 AM   Rod Can, MD Cell [phone]

## 2015-08-02 NOTE — Op Note (Signed)
NAME:  Bonnie Gray, Bonnie Gray NO.:  [id number]  MEDICAL RECORD NO.:  [id number]  LOCATION:  8250                         FACILITY:  [facility]  PHYSICIAN:  Rod Can, MD     DATE OF BIRTH:  Jan 04, 1941  DATE OF PROCEDURE:  08/01/2015 DATE OF DISCHARGE:  08/02/2015                              OPERATIVE REPORT   SURGEON:  Rod Can, M.D.  ASSISTANT:  None.  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  PROCEDURES PERFORMED: 1. Debridement of left knee wound including skin and subcutaneous     tissue. 2. Closure of traumatic wound, totaling 20 cm in length.  ANESTHESIA:  General.  ANTIBIOTICS:  Ancef 2 g.  COMPLICATIONS:  None.  DISPOSITION:  Stable to PACU.  SPECIMENS:  None.  TUBES AND DRAINS:  None.  INDICATIONS:  The patient is a 74 year old female who underwent primary left total knee arthroplasty on June 26, 2015, by Dr. Wynelle Link.  She was doing well and actually had a routine postop visit earlier today. When she went home, her dog knocked her over and she landed directly onto her left knee on a brick surface outside.  She noticed that the wound opened up and she had a significant amount of bleeding.  EMS brought her to the hospital.  X-rays revealed no acute fractures.  She was indicated for debridement and closure of the wound.  Risks, benefits, and alternatives were explained and she elected to proceed.  DESCRIPTION OF PROCEDURE IN DETAIL:  I marked the patient's surgical site.  She was taken to the operating room.  General anesthesia was induced.  I applied a tourniquet to the left thigh which was not utilized.  The left lower extremity was prepped and draped in normal sterile surgical fashion.  Time-out called, verifying side and site of surgery.  IV antibiotics were given within 60  minutes of beginning the procedure. I began by examining her wound.  She had dehisced the skin of the entirety of the incision.  She had a significant hematoma under the lateral skin flap.  I evacuated this hematoma.  I then used a 15 blade to debride any nonviable skin edges.  I then used scissors and a curette to debride the subcutaneous tissue of any nonviable tissue.  I then thoroughly explored the wound.  There were no fascial rents.  The capsulotomy was completely intact.  I then irrigated 3 L of saline through the wound.  I achieved meticulous hemostasis with Bovie electrocautery.  I then closed the wound in layers with 2-0 Vicryl for the deep dermal layer followed by staples for the skin.  Sterile dressing was applied with Adaptic, 4x4s, ABD pads, and a bulky dressing. I placed a knee immobilizer.  Sponge, needle, instrument counts were correct at the end of the case x2.  She was extubated, taken to the PACU in stable condition.  We will admit her to the hospital overnight for standard IV antibiotics. We will mobilize her with physical therapy.  We will plan to discharge her tomorrow with 2-week office followup.  All questions solicited and  answered to her family's satisfaction.          ______________________________ Rod Can, MD     BS/MEDQ  D:  08/01/2015  T:  08/02/2015  Job:  [id number]

## 2015-08-02 NOTE — Evaluation (Signed)
Physical Therapy Evaluation Patient Details Name: Bonnie Gray MRN: [id number] DOB: 09/24/1941 Today's Date: 08/02/2015   History of Present Illness  Pt s/p fall with dehis of L TKR - pt s/p I&D and wound closure  Clinical Impression  Pt admitted as above and presenting with functional mobility limitations 2* post op pain, decreased L LE strength/ROM and KIx 2 weeks.  Pt should progress well to dc home with family assist and HHPT follow up.    Follow Up Recommendations Home health PT    Equipment Recommendations  None recommended by PT    Recommendations for Other Services OT consult     Precautions / Restrictions Precautions Precautions: Knee;Fall Precaution Comments: KI for 2 weeks with gentle movement only and WBAT Required Braces or Orthoses: Knee Immobilizer - Left Knee Immobilizer - Left: On when out of bed or walking Restrictions Weight Bearing Restrictions: No Other Position/Activity Restrictions: WBAT      Mobility  Bed Mobility Overal bed mobility: Modified Independent                Transfers Overall transfer level: Needs assistance Equipment used: Rolling walker (2 wheeled) Transfers: Sit to/from Stand Sit to Stand: Min guard         General transfer comment: cues for LE management and use of UEs to self assist  Ambulation/Gait Ambulation/Gait assistance: Min guard Ambulation Distance (Feet): 159 Feet Assistive device: Rolling walker (2 wheeled) Gait Pattern/deviations: Step-to pattern;Step-through pattern;Decreased step length - right;Decreased step length - left;Shuffle;Trunk flexed Gait velocity: decr Gait velocity interpretation: Below normal speed for age/gender General Gait Details: cues for posture, position from RW and initial sequence  Stairs            Wheelchair Mobility    Modified Rankin (Stroke Patients Only)       Balance                                             Pertinent  Vitals/Pain Pain Assessment: 0-10 Pain Score: 2  Pain Location: L knee Pain Descriptors / Indicators: Aching;Sore Pain Intervention(s): Limited activity within patient's tolerance;Monitored during session;Premedicated before session;Ice applied    Home Living Family/patient expects to be discharged to:: Private residence Living Arrangements: Alone Available Help at Discharge: Family;Available 24 hours/day Type of Home: House Home Access: Stairs to enter Entrance Stairs-Rails: None Entrance Stairs-Number of Steps: 1 +1 Home Layout: Laundry or work area in [place]: Environmental consultant - 4 wheels;Bedside commode;Toilet riser;Shower seat;Cane - single point;Walker - 2 wheels Additional Comments: Pt has 16 +2 steps to basement where she will eventually need to access to get to laundry and freezer.    Prior Function Level of Independence: Independent               Hand Dominance   Dominant Hand: Right    Extremity/Trunk Assessment   Upper Extremity Assessment: Overall WFL for tasks assessed           Lower Extremity Assessment: LLE deficits/detail   LLE Deficits / Details: 3-/5 quads with gentle AAROM at knee -10 - 40  Cervical / Trunk Assessment: Normal  Communication   Communication: No difficulties  Cognition Arousal/Alertness: Awake/alert Behavior During Therapy: WFL for tasks assessed/performed Overall Cognitive Status: Within Functional Limits for tasks assessed  General Comments      Exercises Total Joint Exercises Ankle Circles/Pumps: AROM;Both;15 reps;Supine Quad Sets: AROM;Both;10 reps;Supine Heel Slides: AAROM;Left;10 reps;Supine Straight Leg Raises: AAROM;Left;10 reps;Supine      Assessment/Plan    PT Assessment Patient needs continued PT services  PT Diagnosis Difficulty walking   PT Problem List Decreased strength;Decreased range of motion;Decreased mobility;Decreased knowledge of use of DME;Pain  PT  Treatment Interventions DME instruction;Gait training;Stair training;Functional mobility training;Therapeutic activities;Therapeutic exercise;Patient/family education   PT Goals (Current goals can be found in the Care Plan section) Acute Rehab PT Goals Patient Stated Goal: Get home and not fall again PT Goal Formulation: With patient Time For Goal Achievement: 08/05/15 Potential to Achieve Goals: Good    Frequency 7X/week   Barriers to discharge        Co-evaluation               End of Session Equipment Utilized During Treatment: Gait belt;Left knee immobilizer Activity Tolerance: Patient tolerated treatment well Patient left: in chair;with call bell/phone within reach Nurse Communication: Mobility status    Functional Assessment Tool Used: Clinical judgement Functional Limitation: Mobility: Walking and moving around Mobility: Walking and Moving Around Current Status (U1324): At least 20 percent but less than 40 percent impaired, limited or restricted Mobility: Walking and Moving Around Goal Status [phone]): At least 1 percent but less than 20 percent impaired, limited or restricted    Time: 0921-0957 PT Time Calculation (min) (ACUTE ONLY): 36 min   Charges:   PT Evaluation $Initial PT Evaluation Tier I: 1 Procedure PT Treatments $Gait Training: 8-22 mins $Therapeutic Exercise: 8-22 mins   PT G Codes:   PT G-Codes **NOT FOR INPATIENT CLASS** Functional Assessment Tool Used: Clinical judgement Functional Limitation: Mobility: Walking and moving around Mobility: Walking and Moving Around Current Status (V2536): At least 20 percent but less than 40 percent impaired, limited or restricted Mobility: Walking and Moving Around Goal Status [phone]): At least 1 percent but less than 20 percent impaired, limited or restricted    Camaryn Lumbert 08/02/2015, 11:55 AM

## 2015-08-02 NOTE — Progress Notes (Signed)
Physical Therapy Treatment Patient Details Name: Bonnie Gray MRN: [id number] DOB: 05-May-1941 Today's Date: 08/02/2015    History of Present Illness Pt s/p fall with dehis of L TKR - pt s/p I&D and wound closure    PT Comments    Pt progressing well with mobility and eager for return home.  Reviewed steps into home this pm.  Reviewed KI with pt and dtr  Follow Up Recommendations  Home health PT     Equipment Recommendations  None recommended by PT    Recommendations for Other Services OT consult     Precautions / Restrictions Precautions Precautions: Knee;Fall Precaution Comments: KI for 2 weeks with gentle movement only and WBAT Required Braces or Orthoses: Knee Immobilizer - Left Knee Immobilizer - Left: On when out of bed or walking Restrictions Weight Bearing Restrictions: No Other Position/Activity Restrictions: WBAT    Mobility  Bed Mobility Overal bed mobility: Modified Independent                Transfers Overall transfer level: Needs assistance Equipment used: None Transfers: Sit to/from Stand Sit to Stand: Supervision         General transfer comment: cues for LE management and use of UEs to self assist  Ambulation/Gait Ambulation/Gait assistance: Supervision Ambulation Distance (Feet): 150 Feet Assistive device: Rolling walker (2 wheeled) Gait Pattern/deviations: Step-to pattern;Step-through pattern;Shuffle;Trunk flexed Gait velocity: decr Gait velocity interpretation: Below normal speed for age/gender General Gait Details: min cues for posture, position from RW and initial sequence   Stairs Stairs: Yes Stairs assistance: Min assist Stair Management: No rails;Step to pattern;Forwards;With walker Number of Stairs: 2 (single step twice) General stair comments: min cues for foot/RW placement  Wheelchair Mobility    Modified Rankin (Stroke Patients Only)       Balance                                     Cognition Arousal/Alertness: Awake/alert Behavior During Therapy: WFL for tasks assessed/performed Overall Cognitive Status: Within Functional Limits for tasks assessed                      Exercises Total Joint Exercises Ankle Circles/Pumps: AROM;Both;15 reps;Supine Quad Sets: AROM;Both;10 reps;Supine Heel Slides: AAROM;Left;10 reps;Supine Straight Leg Raises: AAROM;Left;10 reps;Supine    General Comments        Pertinent Vitals/Pain Pain Assessment: 0-10 Pain Score: 2  Pain Location: L knee Pain Descriptors / Indicators: Aching Pain Intervention(s): Limited activity within patient's tolerance;Monitored during session;Ice applied    Home Living Family/patient expects to be discharged to:: Private residence Living Arrangements: Alone Available Help at Discharge: Family;Available 24 hours/day Type of Home: House Home Access: Stairs to enter Entrance Stairs-Rails: None Home Layout: Laundry or work area in [place]: Environmental consultant - 4 wheels;Bedside commode;Toilet riser;Shower seat;Cane - single point;Walker - 2 wheels Additional Comments: Pt has 16 +2 steps to basement where she will eventually need to access to get to laundry and freezer.    Prior Function Level of Independence: Independent          PT Goals (current goals can now be found in the care plan section) Acute Rehab PT Goals Patient Stated Goal: Get home and not fall again PT Goal Formulation: With patient Time For Goal Achievement: 08/05/15 Potential to Achieve Goals: Good Progress towards PT goals: Progressing toward goals    Frequency  7X/week  PT Plan Current plan remains appropriate    Co-evaluation             End of Session Equipment Utilized During Treatment: Gait belt;Left knee immobilizer Activity Tolerance: Patient tolerated treatment well Patient left: in chair;with call bell/phone within reach     Time: 1208-1225 PT Time Calculation (min) (ACUTE ONLY): 17  min  Charges:  $Gait Training: 8-22 mins $Therapeutic Exercise: 8-22 mins                    G Codes:  Functional Assessment Tool Used: Clinical judgement Functional Limitation: Mobility: Walking and moving around Mobility: Walking and Moving Around Current Status (T2549): At least 20 percent but less than 40 percent impaired, limited or restricted Mobility: Walking and Moving Around Goal Status [phone]): At least 1 percent but less than 20 percent impaired, limited or restricted   Tycen Dockter 08/02/2015, 12:26 PM

## 2015-08-02 NOTE — Care Management Note (Signed)
Case Management Note  Patient Details  Name: Bonnie Gray MRN: [id number] Date of Birth: Feb 24, 1941  Subjective/Objective:                   DEBRIDEMENT OF LEFT KNEE WOUND WITH CLOSURE Action/Plan:  Discharge planning Expected Discharge Date:  08/02/15               Expected Discharge Plan:  [place]  In-House Referral:     Discharge planning Services  CM Consult  Post Acute Care Choice:    Choice offered to:  Patient  DME Arranged:    DME Agency:     HH Arranged:  Patient Refused [place] Agency:     Status of Service:  Completed, signed off  Medicare Important Message Given:    Date Medicare IM Given:    Medicare IM give by:    Date Additional Medicare IM Given:    Additional Medicare Important Message give by:     If discussed at [place] of Stay Meetings, dates discussed:    Additional Comments: CM spoke with pt who states she has all DME from previous surgery and politely refuses [place] services.  No other CM needs were communicated. Dellie Catholic, RN 08/02/2015, 1:33 PM

## 2015-11-30 DIAGNOSIS — Z96652 Presence of left artificial knee joint: Secondary | ICD-10-CM | POA: Diagnosis not present

## 2015-11-30 DIAGNOSIS — M545 Low back pain: Secondary | ICD-10-CM | POA: Diagnosis not present

## 2015-11-30 DIAGNOSIS — Z471 Aftercare following joint replacement surgery: Secondary | ICD-10-CM | POA: Diagnosis not present

## 2015-12-08 DIAGNOSIS — R2689 Other abnormalities of gait and mobility: Secondary | ICD-10-CM | POA: Diagnosis not present

## 2015-12-08 DIAGNOSIS — R296 Repeated falls: Secondary | ICD-10-CM | POA: Diagnosis not present

## 2015-12-11 DIAGNOSIS — M4697 Unspecified inflammatory spondylopathy, lumbosacral region: Secondary | ICD-10-CM | POA: Diagnosis not present

## 2015-12-11 DIAGNOSIS — M4806 Spinal stenosis, lumbar region: Secondary | ICD-10-CM | POA: Diagnosis not present

## 2015-12-11 DIAGNOSIS — M9983 Other biomechanical lesions of lumbar region: Secondary | ICD-10-CM | POA: Diagnosis not present

## 2016-01-08 DIAGNOSIS — M5432 Sciatica, left side: Secondary | ICD-10-CM | POA: Diagnosis not present

## 2016-01-08 DIAGNOSIS — I10 Essential (primary) hypertension: Secondary | ICD-10-CM | POA: Diagnosis not present

## 2016-01-09 DIAGNOSIS — M4806 Spinal stenosis, lumbar region: Secondary | ICD-10-CM | POA: Diagnosis not present

## 2016-01-30 DIAGNOSIS — M545 Low back pain: Secondary | ICD-10-CM | POA: Diagnosis not present

## 2016-01-30 DIAGNOSIS — M4806 Spinal stenosis, lumbar region: Secondary | ICD-10-CM | POA: Diagnosis not present

## 2016-02-07 DIAGNOSIS — M545 Low back pain: Secondary | ICD-10-CM | POA: Diagnosis not present

## 2016-02-21 DIAGNOSIS — M545 Low back pain: Secondary | ICD-10-CM | POA: Diagnosis not present

## 2016-03-11 DIAGNOSIS — I10 Essential (primary) hypertension: Secondary | ICD-10-CM | POA: Diagnosis not present

## 2016-03-11 DIAGNOSIS — M1990 Unspecified osteoarthritis, unspecified site: Secondary | ICD-10-CM | POA: Diagnosis not present

## 2016-03-18 DIAGNOSIS — H35363 Drusen (degenerative) of macula, bilateral: Secondary | ICD-10-CM | POA: Diagnosis not present

## 2016-04-11 DIAGNOSIS — H353112 Nonexudative age-related macular degeneration, right eye, intermediate dry stage: Secondary | ICD-10-CM | POA: Diagnosis not present

## 2016-04-11 DIAGNOSIS — H2511 Age-related nuclear cataract, right eye: Secondary | ICD-10-CM | POA: Diagnosis not present

## 2016-04-11 DIAGNOSIS — H25041 Posterior subcapsular polar age-related cataract, right eye: Secondary | ICD-10-CM | POA: Diagnosis not present

## 2016-04-11 DIAGNOSIS — H35031 Hypertensive retinopathy, right eye: Secondary | ICD-10-CM | POA: Diagnosis not present

## 2016-04-11 DIAGNOSIS — H353122 Nonexudative age-related macular degeneration, left eye, intermediate dry stage: Secondary | ICD-10-CM | POA: Diagnosis not present

## 2016-04-11 DIAGNOSIS — H25011 Cortical age-related cataract, right eye: Secondary | ICD-10-CM | POA: Diagnosis not present

## 2016-05-07 DIAGNOSIS — H25011 Cortical age-related cataract, right eye: Secondary | ICD-10-CM | POA: Diagnosis not present

## 2016-05-07 DIAGNOSIS — H25041 Posterior subcapsular polar age-related cataract, right eye: Secondary | ICD-10-CM | POA: Diagnosis not present

## 2016-05-07 DIAGNOSIS — H2511 Age-related nuclear cataract, right eye: Secondary | ICD-10-CM | POA: Diagnosis not present

## 2016-05-07 DIAGNOSIS — H25811 Combined forms of age-related cataract, right eye: Secondary | ICD-10-CM | POA: Diagnosis not present

## 2016-05-13 DIAGNOSIS — I10 Essential (primary) hypertension: Secondary | ICD-10-CM | POA: Diagnosis not present

## 2016-05-13 DIAGNOSIS — M545 Low back pain: Secondary | ICD-10-CM | POA: Diagnosis not present

## 2016-05-27 DIAGNOSIS — I10 Essential (primary) hypertension: Secondary | ICD-10-CM | POA: Diagnosis not present

## 2016-05-28 DIAGNOSIS — Z961 Presence of intraocular lens: Secondary | ICD-10-CM | POA: Diagnosis not present

## 2016-07-01 DIAGNOSIS — I10 Essential (primary) hypertension: Secondary | ICD-10-CM | POA: Diagnosis not present

## 2016-07-01 DIAGNOSIS — M545 Low back pain: Secondary | ICD-10-CM | POA: Diagnosis not present

## 2016-07-15 DIAGNOSIS — I10 Essential (primary) hypertension: Secondary | ICD-10-CM | POA: Diagnosis not present

## 2016-07-15 DIAGNOSIS — M545 Low back pain: Secondary | ICD-10-CM | POA: Diagnosis not present

## 2016-07-15 DIAGNOSIS — Z23 Encounter for immunization: Secondary | ICD-10-CM | POA: Diagnosis not present

## 2016-09-16 DIAGNOSIS — I10 Essential (primary) hypertension: Secondary | ICD-10-CM | POA: Diagnosis not present

## 2016-09-16 DIAGNOSIS — E784 Other hyperlipidemia: Secondary | ICD-10-CM | POA: Diagnosis not present

## 2016-09-17 DIAGNOSIS — R7309 Other abnormal glucose: Secondary | ICD-10-CM | POA: Diagnosis not present

## 2016-09-17 DIAGNOSIS — I10 Essential (primary) hypertension: Secondary | ICD-10-CM | POA: Diagnosis not present

## 2016-09-17 DIAGNOSIS — R5383 Other fatigue: Secondary | ICD-10-CM | POA: Diagnosis not present

## 2016-09-17 DIAGNOSIS — E784 Other hyperlipidemia: Secondary | ICD-10-CM | POA: Diagnosis not present

## 2016-09-18 IMAGING — CR DG RIBS W/ CHEST 3+V*L*
5 series · 5 of 5 positions shown · non-contrast
Comparison: Chest radiograph 01/10/2015

CLINICAL DATA: Fell on left side and complains of left rib pain.

EXAM:
LEFT RIBS AND CHEST - 3+ VIEW

[x chest ap]
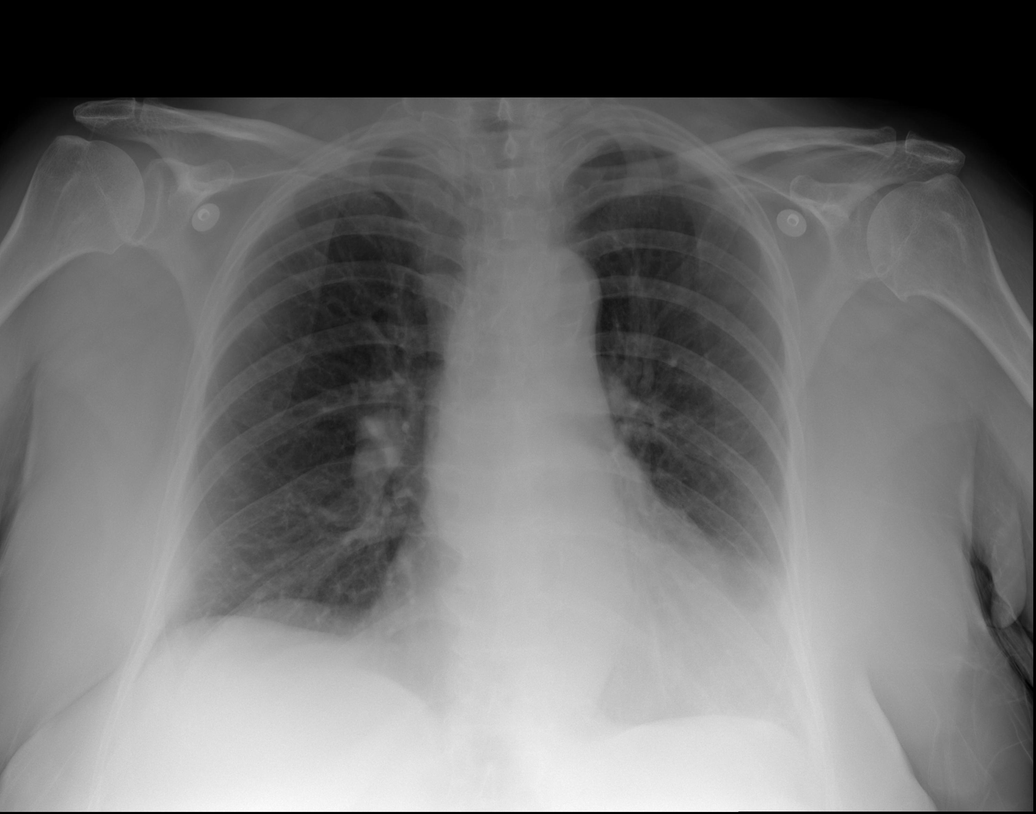

[t ribs ap upper left (1 of 2)]
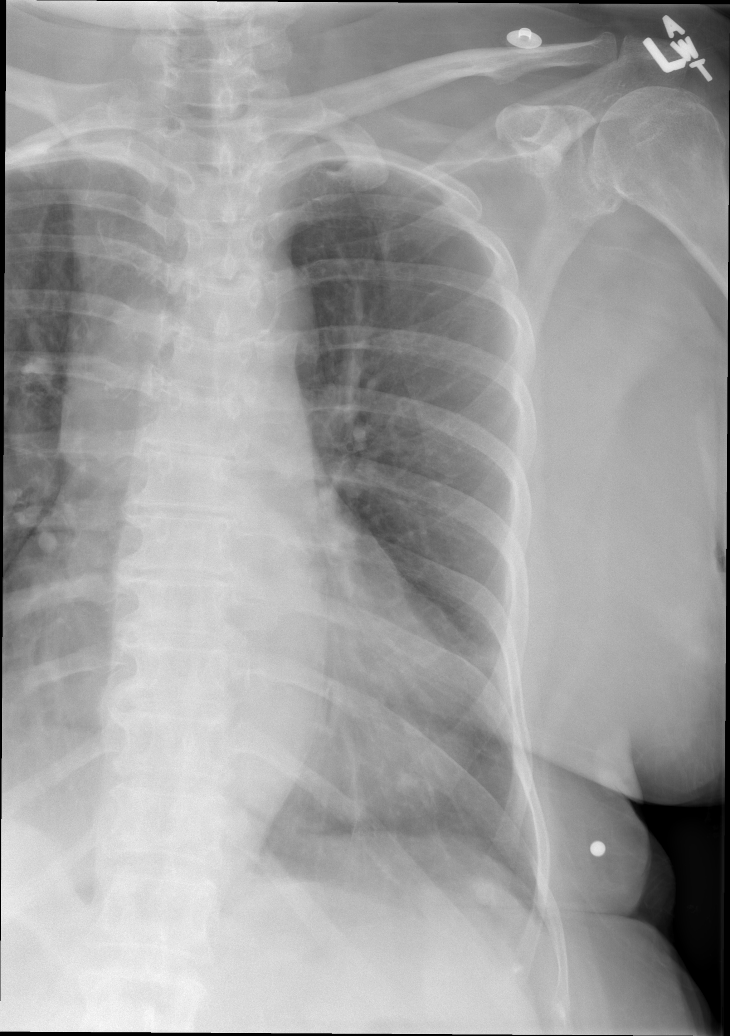

[t ribs ap upper left (2 of 2)]
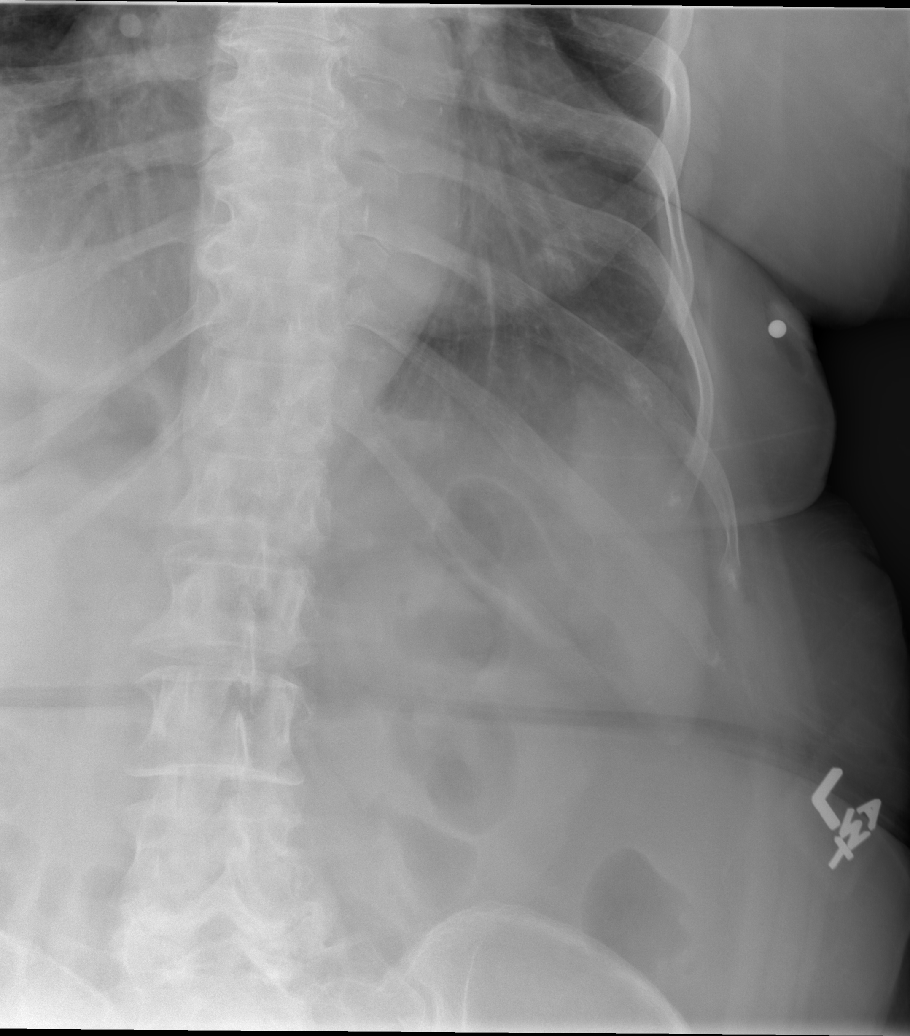

[t ribs lpo left (1 of 2)]
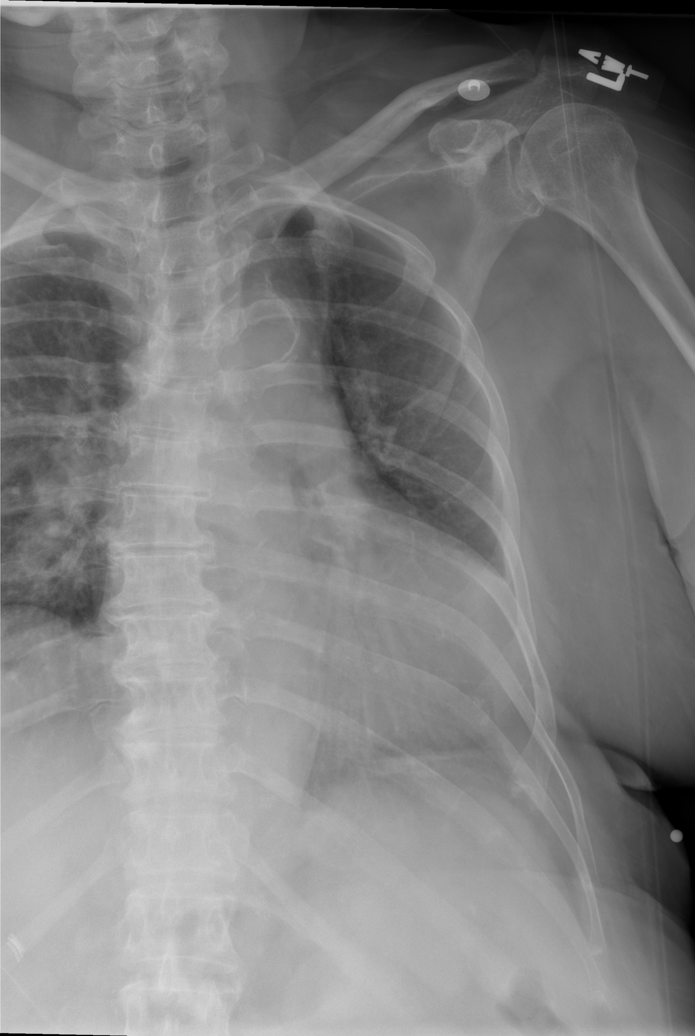

[t ribs lpo left (2 of 2)]
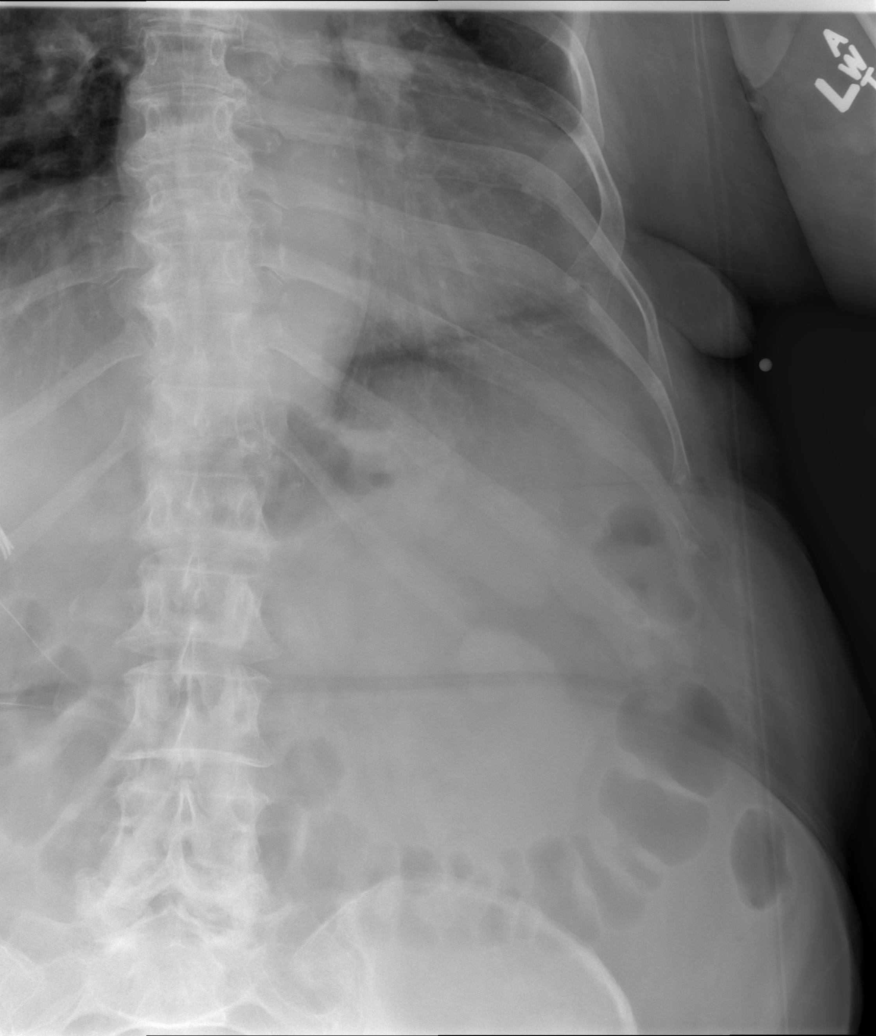

[5 of 5 positions shown; findings below may reference images not displayed]

FINDINGS: Stable appearance of the heart and mediastinum. Negative for a
pneumothorax. No evidence for a displaced left rib fracture. Lungs
are clear without airspace disease or pulmonary edema.
IMPRESSION: No acute abnormality.

## 2016-09-18 IMAGING — CR DG ELBOW COMPLETE 3+V*L*
4 series · 4 of 4 positions shown · non-contrast
Comparison: None.

CLINICAL DATA: Per EMS, pt was working in her garden when her dog
ran up to her and caused her to fall on a concrete path surrounded
by bricks; pt fell on left side and is c/o left rib, left elbow, and
left knee pain; pt had left knee surgery

EXAM:
LEFT ELBOW - COMPLETE 3+ VIEW

[x elbow obl left (1 of 3)]
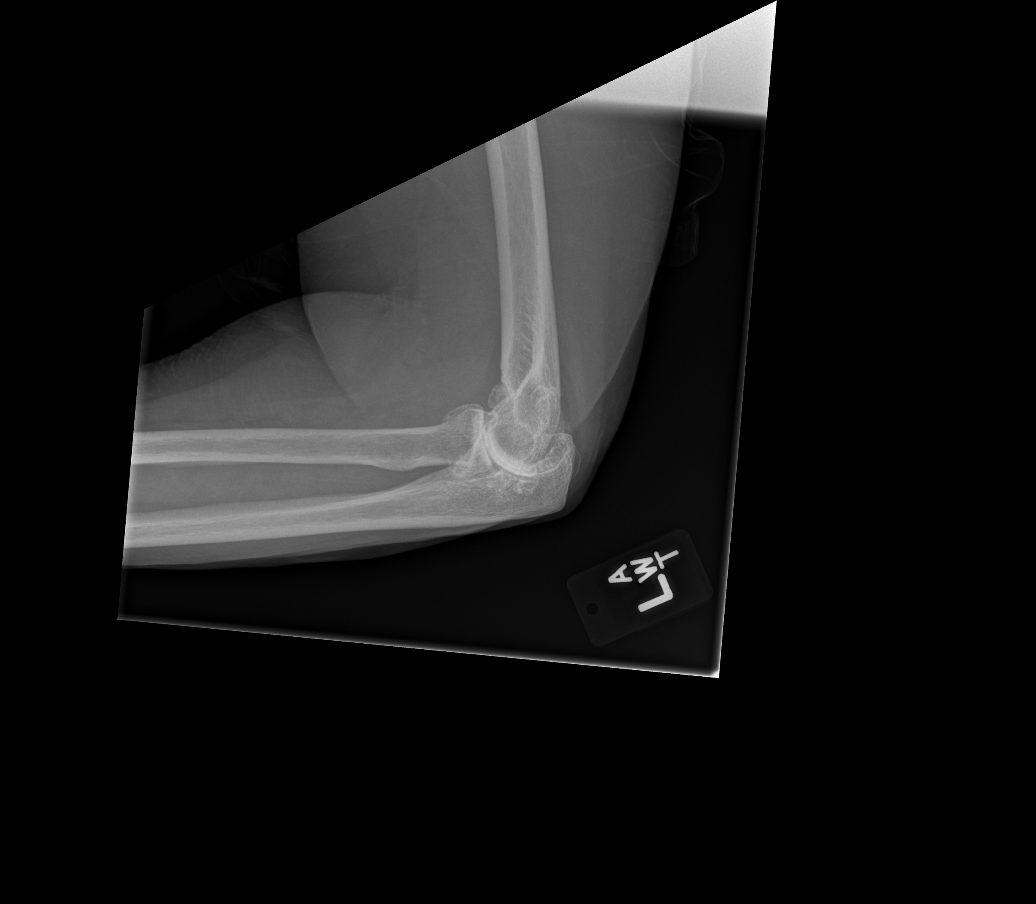

[x elbow obl left (2 of 3)]
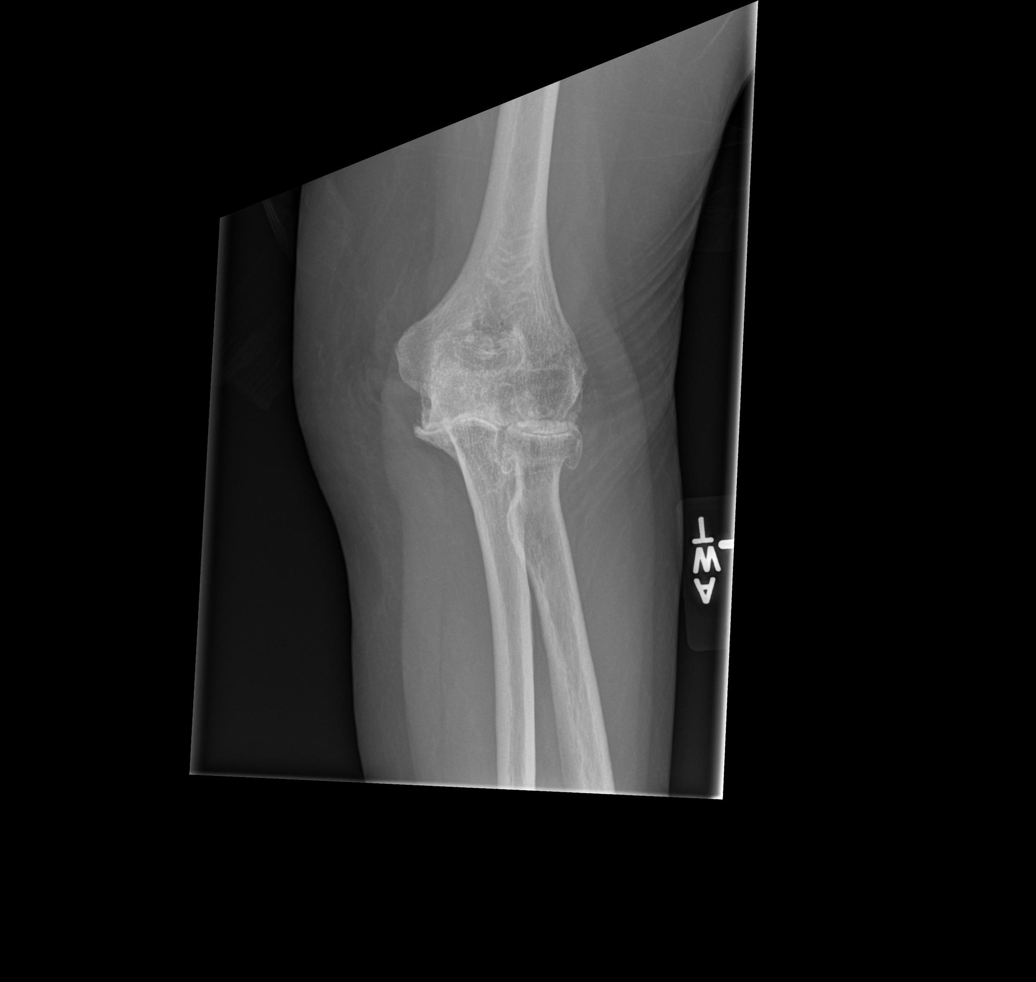

[x elbow lat left]
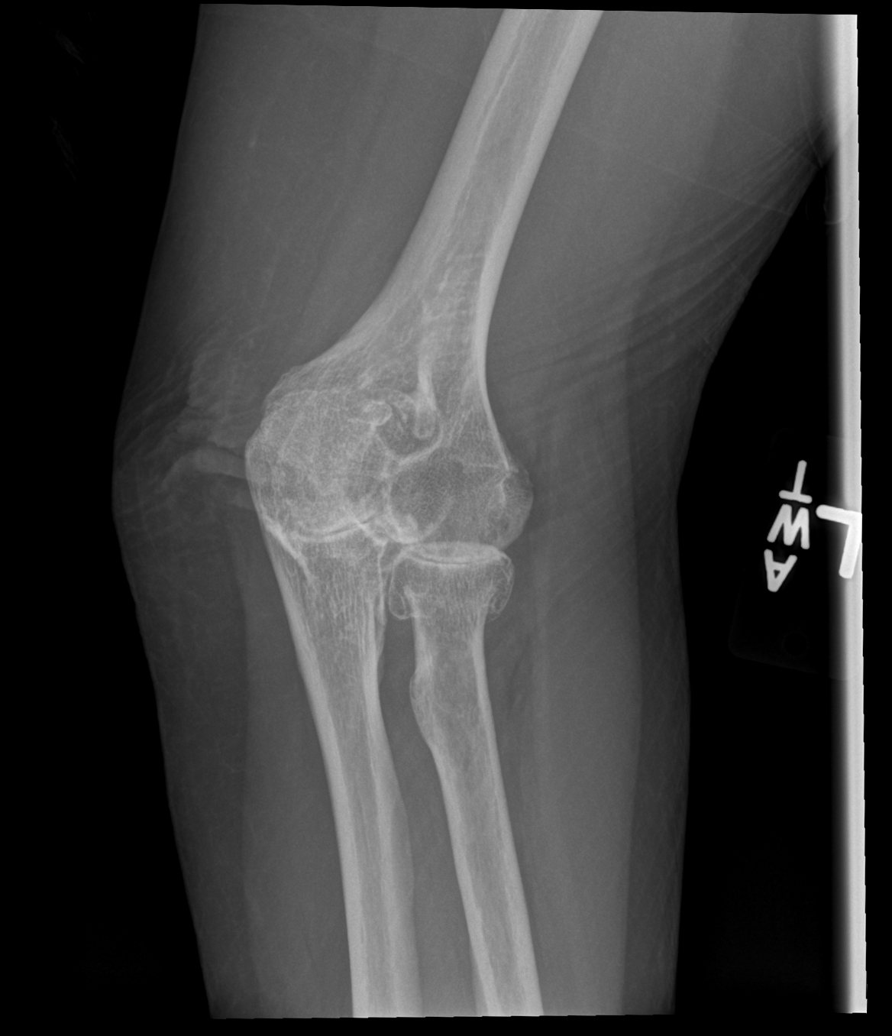

[x elbow obl left (3 of 3)]
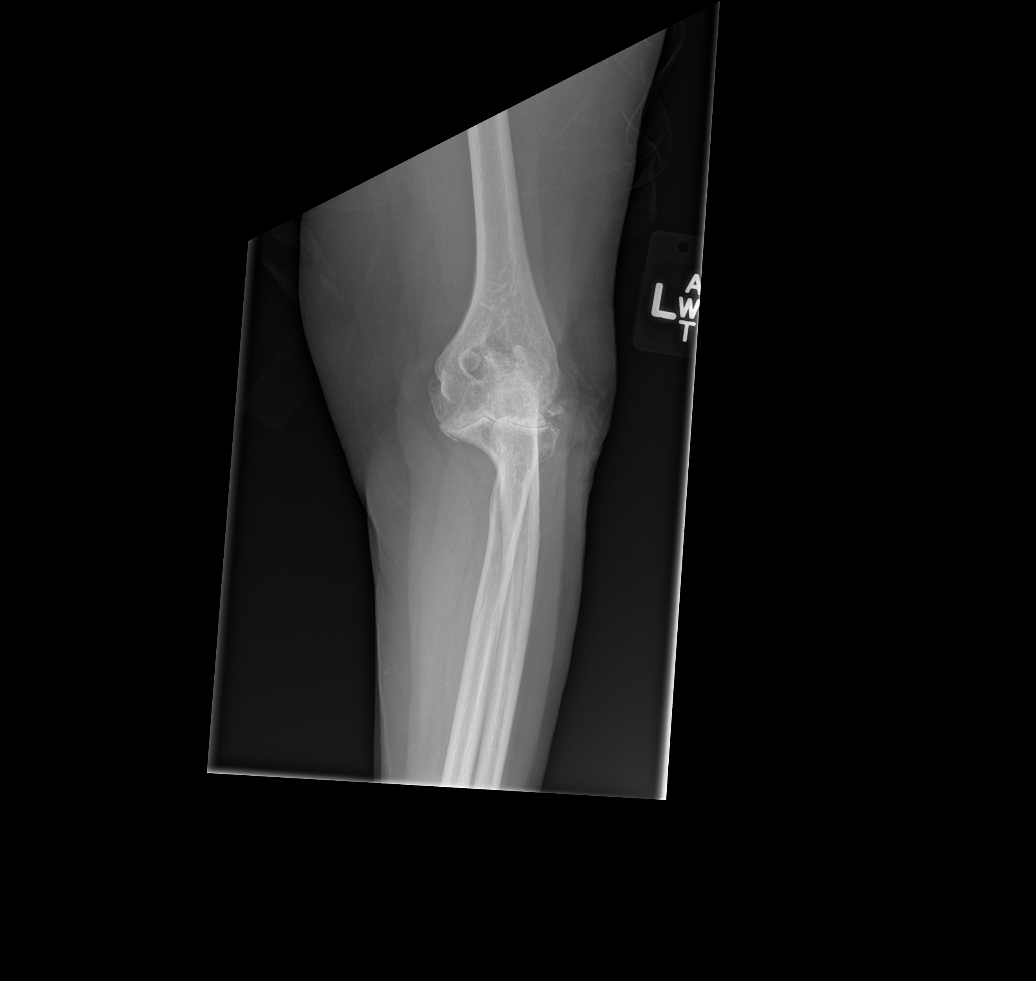

[4 of 4 positions shown; findings below may reference images not displayed]

FINDINGS: Marked narrowing of the articular cartilage with subchondral
sclerosis in the distal humerus, radial head and coronoid process.
Small corticated ossicles project lateral to the lateral condyle.
Spurring from the coronoid process of the ulna and or from the
radial head. No definite acute fracture. No effusion is evident.
IMPRESSION: 1. Advanced degenerative changes without fracture, dislocation, or
other acute abnormality.

## 2016-09-23 DIAGNOSIS — I10 Essential (primary) hypertension: Secondary | ICD-10-CM | POA: Diagnosis not present

## 2016-10-22 DIAGNOSIS — H1032 Unspecified acute conjunctivitis, left eye: Secondary | ICD-10-CM | POA: Diagnosis not present

## 2016-10-22 DIAGNOSIS — Z961 Presence of intraocular lens: Secondary | ICD-10-CM | POA: Diagnosis not present

## 2016-10-22 DIAGNOSIS — H26491 Other secondary cataract, right eye: Secondary | ICD-10-CM | POA: Diagnosis not present

## 2016-10-22 DIAGNOSIS — H10502 Unspecified blepharoconjunctivitis, left eye: Secondary | ICD-10-CM | POA: Diagnosis not present

## 2016-10-22 DIAGNOSIS — D2310 Other benign neoplasm of skin of unspecified eyelid, including canthus: Secondary | ICD-10-CM | POA: Diagnosis not present

## 2016-10-22 DIAGNOSIS — H1859 Other hereditary corneal dystrophies: Secondary | ICD-10-CM | POA: Diagnosis not present

## 2016-10-22 DIAGNOSIS — H04123 Dry eye syndrome of bilateral lacrimal glands: Secondary | ICD-10-CM | POA: Diagnosis not present

## 2016-10-22 DIAGNOSIS — H04122 Dry eye syndrome of left lacrimal gland: Secondary | ICD-10-CM | POA: Diagnosis not present

## 2016-10-22 DIAGNOSIS — H1851 Endothelial corneal dystrophy: Secondary | ICD-10-CM | POA: Diagnosis not present

## 2016-10-22 DIAGNOSIS — Z9842 Cataract extraction status, left eye: Secondary | ICD-10-CM | POA: Diagnosis not present

## 2016-10-28 DIAGNOSIS — H01113 Allergic dermatitis of right eye, unspecified eyelid: Secondary | ICD-10-CM | POA: Diagnosis not present

## 2016-10-28 DIAGNOSIS — L219 Seborrheic dermatitis, unspecified: Secondary | ICD-10-CM | POA: Diagnosis not present

## 2016-11-18 DIAGNOSIS — H04123 Dry eye syndrome of bilateral lacrimal glands: Secondary | ICD-10-CM | POA: Diagnosis not present

## 2016-12-23 DIAGNOSIS — I10 Essential (primary) hypertension: Secondary | ICD-10-CM | POA: Diagnosis not present

## 2017-01-06 DIAGNOSIS — I10 Essential (primary) hypertension: Secondary | ICD-10-CM | POA: Diagnosis not present

## 2017-01-16 ENCOUNTER — Encounter (INDEPENDENT_AMBULATORY_CARE_PROVIDER_SITE_OTHER): Payer: Self-pay | Admitting: *Deleted

## 2017-02-03 DIAGNOSIS — I10 Essential (primary) hypertension: Secondary | ICD-10-CM | POA: Diagnosis not present

## 2017-02-27 DIAGNOSIS — I10 Essential (primary) hypertension: Secondary | ICD-10-CM | POA: Diagnosis not present

## 2017-02-27 DIAGNOSIS — J019 Acute sinusitis, unspecified: Secondary | ICD-10-CM | POA: Diagnosis not present

## 2017-03-13 DIAGNOSIS — H40053 Ocular hypertension, bilateral: Secondary | ICD-10-CM | POA: Diagnosis not present

## 2017-04-02 DIAGNOSIS — N390 Urinary tract infection, site not specified: Secondary | ICD-10-CM | POA: Diagnosis not present

## 2017-04-02 DIAGNOSIS — I10 Essential (primary) hypertension: Secondary | ICD-10-CM | POA: Diagnosis not present

## 2017-04-02 DIAGNOSIS — B373 Candidiasis of vulva and vagina: Secondary | ICD-10-CM | POA: Diagnosis not present

## 2017-04-02 DIAGNOSIS — M279 Disease of jaws, unspecified: Secondary | ICD-10-CM | POA: Diagnosis not present

## 2017-04-16 DIAGNOSIS — I10 Essential (primary) hypertension: Secondary | ICD-10-CM | POA: Diagnosis not present

## 2017-06-18 DIAGNOSIS — F419 Anxiety disorder, unspecified: Secondary | ICD-10-CM | POA: Diagnosis not present

## 2017-06-18 DIAGNOSIS — I10 Essential (primary) hypertension: Secondary | ICD-10-CM | POA: Diagnosis not present

## 2017-07-16 DIAGNOSIS — Z23 Encounter for immunization: Secondary | ICD-10-CM | POA: Diagnosis not present

## 2017-07-16 DIAGNOSIS — I10 Essential (primary) hypertension: Secondary | ICD-10-CM | POA: Diagnosis not present

## 2017-08-13 DIAGNOSIS — I10 Essential (primary) hypertension: Secondary | ICD-10-CM | POA: Diagnosis not present

## 2017-10-14 DIAGNOSIS — M25579 Pain in unspecified ankle and joints of unspecified foot: Secondary | ICD-10-CM | POA: Diagnosis not present

## 2017-10-14 DIAGNOSIS — M79672 Pain in left foot: Secondary | ICD-10-CM | POA: Diagnosis not present

## 2018-01-16 DIAGNOSIS — J309 Allergic rhinitis, unspecified: Secondary | ICD-10-CM | POA: Diagnosis not present

## 2018-01-16 DIAGNOSIS — E669 Obesity, unspecified: Secondary | ICD-10-CM | POA: Diagnosis not present

## 2018-01-16 DIAGNOSIS — I10 Essential (primary) hypertension: Secondary | ICD-10-CM | POA: Diagnosis not present

## 2018-04-16 DIAGNOSIS — E785 Hyperlipidemia, unspecified: Secondary | ICD-10-CM | POA: Diagnosis not present

## 2018-04-16 DIAGNOSIS — Z0000 Encounter for general adult medical examination without abnormal findings: Secondary | ICD-10-CM | POA: Diagnosis not present

## 2018-04-16 DIAGNOSIS — M1990 Unspecified osteoarthritis, unspecified site: Secondary | ICD-10-CM | POA: Diagnosis not present

## 2018-04-16 DIAGNOSIS — R7303 Prediabetes: Secondary | ICD-10-CM | POA: Diagnosis not present

## 2018-04-16 DIAGNOSIS — R5383 Other fatigue: Secondary | ICD-10-CM | POA: Diagnosis not present

## 2018-04-16 DIAGNOSIS — I10 Essential (primary) hypertension: Secondary | ICD-10-CM | POA: Diagnosis not present

## 2018-04-30 DIAGNOSIS — M25562 Pain in left knee: Secondary | ICD-10-CM | POA: Diagnosis not present

## 2018-07-30 DIAGNOSIS — E119 Type 2 diabetes mellitus without complications: Secondary | ICD-10-CM | POA: Diagnosis not present

## 2018-09-10 DIAGNOSIS — I10 Essential (primary) hypertension: Secondary | ICD-10-CM | POA: Diagnosis not present

## 2018-09-10 DIAGNOSIS — E119 Type 2 diabetes mellitus without complications: Secondary | ICD-10-CM | POA: Diagnosis not present

## 2018-10-15 DIAGNOSIS — R040 Epistaxis: Secondary | ICD-10-CM | POA: Diagnosis not present

## 2018-10-15 DIAGNOSIS — Z23 Encounter for immunization: Secondary | ICD-10-CM | POA: Diagnosis not present

## 2018-10-15 DIAGNOSIS — I10 Essential (primary) hypertension: Secondary | ICD-10-CM | POA: Diagnosis not present

## 2018-11-12 DIAGNOSIS — E119 Type 2 diabetes mellitus without complications: Secondary | ICD-10-CM | POA: Diagnosis not present

## 2018-11-19 DIAGNOSIS — E119 Type 2 diabetes mellitus without complications: Secondary | ICD-10-CM | POA: Diagnosis not present

## 2018-11-19 DIAGNOSIS — R42 Dizziness and giddiness: Secondary | ICD-10-CM | POA: Diagnosis not present

## 2019-02-17 DIAGNOSIS — M25512 Pain in left shoulder: Secondary | ICD-10-CM | POA: Diagnosis not present

## 2019-02-17 DIAGNOSIS — R631 Polydipsia: Secondary | ICD-10-CM | POA: Diagnosis not present

## 2019-02-17 DIAGNOSIS — M542 Cervicalgia: Secondary | ICD-10-CM | POA: Diagnosis not present

## 2019-03-19 DIAGNOSIS — R631 Polydipsia: Secondary | ICD-10-CM | POA: Diagnosis not present

## 2019-03-19 DIAGNOSIS — R197 Diarrhea, unspecified: Secondary | ICD-10-CM | POA: Diagnosis not present

## 2019-03-19 DIAGNOSIS — R1084 Generalized abdominal pain: Secondary | ICD-10-CM | POA: Diagnosis not present

## 2019-05-21 DIAGNOSIS — R5383 Other fatigue: Secondary | ICD-10-CM | POA: Diagnosis not present

## 2019-05-21 DIAGNOSIS — E119 Type 2 diabetes mellitus without complications: Secondary | ICD-10-CM | POA: Diagnosis not present

## 2019-06-24 DIAGNOSIS — E11319 Type 2 diabetes mellitus with unspecified diabetic retinopathy without macular edema: Secondary | ICD-10-CM | POA: Diagnosis not present

## 2019-07-23 DIAGNOSIS — E119 Type 2 diabetes mellitus without complications: Secondary | ICD-10-CM | POA: Diagnosis not present

## 2019-07-23 DIAGNOSIS — Z23 Encounter for immunization: Secondary | ICD-10-CM | POA: Diagnosis not present

## 2019-08-19 DIAGNOSIS — H26491 Other secondary cataract, right eye: Secondary | ICD-10-CM | POA: Diagnosis not present

## 2019-08-19 DIAGNOSIS — Z961 Presence of intraocular lens: Secondary | ICD-10-CM | POA: Diagnosis not present

## 2019-08-19 DIAGNOSIS — H353132 Nonexudative age-related macular degeneration, bilateral, intermediate dry stage: Secondary | ICD-10-CM | POA: Diagnosis not present

## 2019-08-19 DIAGNOSIS — H35033 Hypertensive retinopathy, bilateral: Secondary | ICD-10-CM | POA: Diagnosis not present

## 2019-09-24 DIAGNOSIS — E119 Type 2 diabetes mellitus without complications: Secondary | ICD-10-CM | POA: Diagnosis not present

## 2019-11-26 DIAGNOSIS — M25562 Pain in left knee: Secondary | ICD-10-CM | POA: Diagnosis not present

## 2019-11-26 DIAGNOSIS — Z96652 Presence of left artificial knee joint: Secondary | ICD-10-CM | POA: Diagnosis not present

## 2019-11-26 DIAGNOSIS — S3993XA Unspecified injury of pelvis, initial encounter: Secondary | ICD-10-CM | POA: Diagnosis not present

## 2019-11-26 DIAGNOSIS — I10 Essential (primary) hypertension: Secondary | ICD-10-CM | POA: Diagnosis not present

## 2019-11-26 DIAGNOSIS — S8992XA Unspecified injury of left lower leg, initial encounter: Secondary | ICD-10-CM | POA: Diagnosis not present

## 2019-11-26 DIAGNOSIS — Z79899 Other long term (current) drug therapy: Secondary | ICD-10-CM | POA: Diagnosis not present

## 2019-11-26 DIAGNOSIS — Z471 Aftercare following joint replacement surgery: Secondary | ICD-10-CM | POA: Diagnosis not present

## 2019-11-26 DIAGNOSIS — S93402A Sprain of unspecified ligament of left ankle, initial encounter: Secondary | ICD-10-CM | POA: Diagnosis not present

## 2019-11-26 DIAGNOSIS — Z888 Allergy status to other drugs, medicaments and biological substances status: Secondary | ICD-10-CM | POA: Diagnosis not present

## 2019-11-26 DIAGNOSIS — M1612 Unilateral primary osteoarthritis, left hip: Secondary | ICD-10-CM | POA: Diagnosis not present

## 2019-11-26 DIAGNOSIS — M25552 Pain in left hip: Secondary | ICD-10-CM | POA: Diagnosis not present

## 2019-11-26 DIAGNOSIS — R631 Polydipsia: Secondary | ICD-10-CM | POA: Diagnosis not present

## 2019-12-09 DIAGNOSIS — M1612 Unilateral primary osteoarthritis, left hip: Secondary | ICD-10-CM | POA: Diagnosis not present

## 2019-12-09 DIAGNOSIS — Z96653 Presence of artificial knee joint, bilateral: Secondary | ICD-10-CM | POA: Diagnosis not present

## 2019-12-17 DIAGNOSIS — R7303 Prediabetes: Secondary | ICD-10-CM | POA: Diagnosis not present

## 2019-12-17 DIAGNOSIS — M25559 Pain in unspecified hip: Secondary | ICD-10-CM | POA: Diagnosis not present

## 2019-12-17 DIAGNOSIS — E785 Hyperlipidemia, unspecified: Secondary | ICD-10-CM | POA: Diagnosis not present

## 2019-12-17 DIAGNOSIS — R5383 Other fatigue: Secondary | ICD-10-CM | POA: Diagnosis not present

## 2019-12-17 DIAGNOSIS — R079 Chest pain, unspecified: Secondary | ICD-10-CM | POA: Diagnosis not present

## 2019-12-17 DIAGNOSIS — I10 Essential (primary) hypertension: Secondary | ICD-10-CM | POA: Diagnosis not present

## 2020-01-04 NOTE — H&P (Signed)
TOTAL HIP ADMISSION H&P  Patient is admitted for left total hip arthroplasty.  Subjective:  Chief Complaint: Left hip pain  HPI: Bonnie Gray, 79 y.o. female, has a history of pain and functional disability in the left hip due to arthritis and patient has failed non-surgical conservative treatments for greater than 12 weeks to include use of assistive devices and activity modification. Onset of symptoms was gradual, starting 3 years ago with gradually worsening course since that time. The patient noted no past surgery on the left hip. Patient currently rates pain in the left hip at 9 out of 10 with activity. Patient has worsening of pain with activity and weight bearing, pain that interfers with activities of daily living and instability. 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 This condition presents safety issues increasing the risk of falls. There is no current active infection.  Patient Active Problem List   Diagnosis Date Noted  . Wound dehiscence 08/01/2015  . OA (osteoarthritis) of knee 01/09/2015  . Dyspnea on exertion 11/17/2014  . Right bundle branch block 11/17/2014  . Hypertension 01/14/2012  . Osteoarthritis 01/14/2012  . Change in bowel habits 01/14/2012  . Diarrhea 01/14/2012    Past Medical History:  Diagnosis Date  . Anemia    as a child  . Anxiety   . Cataract    right  . Chronic pain   . Diarrhea   . Diverticulitis   . Diverticulosis   . GERD (gastroesophageal reflux disease)   . Headache    migraines  . Hemorrhoids   . History of blood transfusion   . History of measles   . History of mumps   . History of rubella   . History of urinary tract infection   . Hypertension   . Impaired vision    right eye   . Macular degeneration    right   . Menopause   . Osteoarthritis    knees; degenerative disc disease  . Right bundle branch block   .  Shortness of breath dyspnea    "because of the weight"/01/02/15- states unchanged  . Wears dentures     Past Surgical History:  Procedure Laterality Date  . APPENDECTOMY  1956  . CATARACT EXTRACTION Left 2010  . CHOLECYSTECTOMY  1995  . COLONOSCOPY  4 years ago  . DILATION AND CURETTAGE OF UTERUS    . ESOPHAGOGASTRODUODENOSCOPY  4 years ago  . EYE SURGERY Left    cataract extraction with IOL  . FOOT SURGERY     bunion and growth in arch. ( left foot)  . INCISION AND DRAINAGE Left 08/01/2015   Procedure: DEBRIDEMENT OF LEFT KNEE WOUND WITH CLOSURE;  Surgeon: Rod Can, MD;  Location: [facility];  Service: Orthopedics;  Laterality: Left;  . INJECTION KNEE Left 01/09/2015   Procedure: KNEE INJECTION;  Surgeon: Gaynelle Arabian, MD;  Location: [facility];  Service: Orthopedics;  Laterality: Left;  . KNEE ARTHROSCOPY Right    2009  . MALONEY DILATION  01/16/2012   Procedure: MALONEY DILATION;  Surgeon: Rogene Houston, MD;  Location: [facility];  Service: Endoscopy;  Laterality: N/A;  . NASAL SINUS SURGERY  1979  . TOTAL ABDOMINAL HYSTERECTOMY  1983   endometriosis  . TOTAL KNEE ARTHROPLASTY Right 01/09/2015   Procedure: RIGHT TOTAL KNEE ARTHROPLASTY;  Surgeon: Gaynelle Arabian, MD;  Location: [facility];  Service: Orthopedics;  Laterality: Right;  . TOTAL  KNEE ARTHROPLASTY Left 06/26/2015   Procedure: LEFT TOTAL KNEE ARTHROPLASTY;  Surgeon: Gaynelle Arabian, MD;  Location: [facility];  Service: Orthopedics;  Laterality: Left;    Prior to Admission medications   Medication Sig Start Date End Date Taking? Authorizing Provider  acetaminophen (TYLENOL) 500 MG tablet Take 1,000 mg by mouth every 6 (six) hours as needed for mild pain or headache.    [provider]  Alum Hydroxide-Mag Carbonate (GAVISCON PO) Take 1 tablet by mouth 2 (two) times daily as needed (indigestion).    [provider]  aspirin EC 81 MG tablet Take 81 mg by mouth daily.    [provider]  beta carotene  w/minerals (OCUVITE) tablet Take 1 tablet by mouth every morning.     [provider]  diazepam (VALIUM) 2 MG tablet Take 1-2 mg by mouth daily as needed for anxiety.  07/22/15   [provider]  fluticasone (FLONASE) 50 MCG/ACT nasal spray Place 1 spray into both nostrils daily as needed for allergies or rhinitis.    [provider]  guaiFENesin-codeine (ROBITUSSIN AC) 100-10 MG/5ML syrup Take 15 mLs by mouth 2 (two) times daily as needed for cough or congestion.     [provider]  lisinopril-hydrochlorothiazide (PRINZIDE,ZESTORETIC) 20-12.5 MG per tablet Take 2 tablets by mouth every morning.    [provider]  loratadine (CLARITIN) 10 MG tablet Take 10 mg by mouth every morning.     [provider]  meloxicam (MOBIC) 15 MG tablet Take 15 mg by mouth daily.    [provider]  Multiple Vitamin (MULTIVITAMIN WITH MINERALS) TABS tablet Take 1 tablet by mouth daily.    [provider]  Multiple Vitamins-Minerals (PRESERVISION AREDS PO) Take 1 tablet by mouth daily.    [provider]  nebivolol (BYSTOLIC) 5 MG tablet Take 5 mg by mouth at bedtime.    [provider]  ondansetron (ZOFRAN) 4 MG tablet Take 4 mg by mouth every 8 (eight) hours as needed for nausea or vomiting.    [provider]  Polyethyl Glycol-Propyl Glycol (SYSTANE OP) Apply 1 drop to eye 2 (two) times daily as needed (Dry eyes).    [provider]  shark liver oil-cocoa butter (PREPARATION H) 0.25-3-85.5 % suppository Place 1 suppository rectally as needed for hemorrhoids.    [provider]  traMADol (ULTRAM) 50 MG tablet Take 1-2 tablets (50-100 mg total) by mouth every 6 (six) hours as needed (mild pain). 06/27/15   Perkins, Alexzandrew L, PA-C    Allergies  Allergen Reactions  . Exforge [Amlodipine Besylate-Valsartan] Other (See Comments)    Flush;headache  . Oxycodone Hcl Other (See Comments)     Hallucinations  . Dilaudid [Hydromorphone Hcl] Other (See Comments)    Fell asleep standing up  . Norvasc [Amlodipine Besylate]     Rash  . Robaxin [Methocarbamol] Nausea Only and Other (See Comments)    depression  . Caduet [Amlodipine-Atorvastatin] Rash  . Celebrex [Celecoxib]     hives  . Flexeril [Cyclobenzaprine] Rash    Social History   Socioeconomic History  . Marital status: Divorced    Spouse name: Not on file  . Number of children: Not on file  . Years of education: Not on file  . Highest education level: Not on file  Occupational History  . Not on file  Tobacco Use  . Smoking status: Never Smoker  . Smokeless tobacco: Never Used  Substance and Sexual Activity  . Alcohol use:  Yes    Comment: drinks beer and wine only occas per week   . Drug use: No  . Sexual activity: Not on file  Other Topics Concern  . Not on file  Social History Narrative  . Not on file   Social Determinants of Health   Financial Resource Strain:   . Difficulty of Paying Living Expenses:   Food Insecurity:   . Worried About Charity fundraiser in the Last Year:   . Arboriculturist in the Last Year:   Transportation Needs:   . Film/video editor (Medical):   Marland Kitchen Lack of Transportation (Non-Medical):   Physical Activity:   . Days of Exercise per Week:   . Minutes of Exercise per Session:   Stress:   . Feeling of Stress :   Social Connections:   . Frequency of Communication with Friends and Family:   . Frequency of Social Gatherings with Friends and Family:   . Attends Religious Services:   . Active Member of Clubs or Organizations:   . Attends Archivist Meetings:   Marland Kitchen Marital Status:   Intimate Partner Violence:   . Fear of Current or Ex-Partner:   . Emotionally Abused:   Marland Kitchen Physically Abused:   . Sexually Abused:       Tobacco Use:   . Smoking Tobacco Use:   . Smokeless Tobacco Use:    Social History   Substance and Sexual Activity  Alcohol Use Yes    Comment: drinks beer and wine only occas per week     Family History  Problem Relation Age of Onset  . Colon cancer Neg Hx     Review of Systems  Constitutional: Negative for chills and fever.  HENT: Negative for congestion, sore throat and tinnitus.   Eyes: Negative for double vision, photophobia and pain.  Respiratory: Negative for cough, shortness of breath and wheezing.   Cardiovascular: Negative for chest pain, palpitations and orthopnea.  Gastrointestinal: Negative for heartburn, nausea and vomiting.  Genitourinary: Negative for dysuria, frequency and urgency.  Musculoskeletal: Positive for joint pain.  Neurological: Negative for dizziness, weakness and headaches.    Objective:  Physical Exam: Well nourished and well developed.  General: Alert and oriented x3, cooperative and pleasant, no acute distress.  Head: normocephalic, atraumatic, neck supple.  Eyes: EOMI.  Respiratory: breath sounds clear in all fields, no wheezing, rales, or rhonchi. Cardiovascular: Regular rate and rhythm, no murmurs, gallops or rubs.  Abdomen: non-tender to palpation and soft, normoactive bowel sounds. Musculoskeletal:  Left Hip Exam: The range of motion: Flexion to 90 degrees, No internal rotation. Her left lower extremity pain is reproduced with internal rotation of the hip. External rotation is to 10 degrees. Abduction to 10 degrees. Significant discomfort with range of motion of the left hip. There is no tenderness over the greater trochanteric bursa.   Calves soft and nontender. Motor function intact in LE. Strength 5/5 LE bilaterally. Neuro: Distal pulses 2+. Sensation to light touch intact in LE.  Vital signs in last 24 hours: Blood pressure: 160/74 mmHg  Imaging Review Plain radiographs demonstrate severe degenerative joint disease of the left hip. The bone quality appears to be adequate for age and reported activity level.  Assessment/Plan:  End stage arthritis, left hip  The  patient history, physical examination, clinical judgement of the provider and imaging studies are consistent with end stage degenerative joint disease of the left hip and total hip arthroplasty is deemed medically necessary. The  treatment options including medical management, injection therapy, arthroscopy and arthroplasty were discussed at length. The risks and benefits of total hip arthroplasty were presented and reviewed. The risks due to aseptic loosening, infection, stiffness, dislocation/subluxation, thromboembolic complications and other imponderables were discussed. The patient acknowledged the explanation, agreed to proceed with the plan and consent was signed. Patient is being admitted for inpatient treatment for surgery, pain control, PT, OT, prophylactic antibiotics, VTE prophylaxis, progressive ambulation and ADLs and discharge planning.The patient is planning to be discharged home.  Anticipated LOS equal to or greater than 2 midnights due to - Age 7 and older with one or more of the following:  - Obesity  - Expected need for hospital services (PT, OT, Nursing) required for safe  discharge  - Anticipated need for postoperative skilled nursing care or inpatient rehab  - Active co-morbidities: Cardiac Arrhythmia OR   - Unanticipated findings during/Post Surgery: None  - Patient is a high risk of re-admission due to: None   Therapy Plans: HEP vs HHPT Disposition: Home with daughter Planned DVT Prophylaxis: Aspirin 325 mg BID DME Needed: None PCP: Hardie Shackleton, MD TXA: IV Allergies: Dilaudid, oxycodone (hallucinations), robaxin (narcolepsy), flexeril (rash) Anesthesia Concerns: None BMI: 36.7 Last HgbA1c: 5.1%  Other: Has not been cleared by Dr. Hampton Abbot yet, may need further testing. Is calling to check on this today.  - Patient was instructed on what medications to stop prior to surgery. - Follow-up visit in 2 weeks with Dr. Wynelle Link - Begin physical therapy following surgery -  Pre-operative lab work as pre-surgical testing - Prescriptions will be provided in hospital at time of discharge  Theresa Duty, PA-C Orthopedic Surgery [facility] [place]

## 2020-01-13 ENCOUNTER — Encounter (HOSPITAL_COMMUNITY): Payer: Self-pay

## 2020-01-13 NOTE — Progress Notes (Signed)
PCP - Hardie Shackleton Clearance 01-06-20 on chart  Cardiologist -   Chest x-ray -  EKG - requested x 2  [phone]  Dr. Hardie Shackleton    Also done 01-17-20 epic Stress Test -  ECHO -  Cardiac Cath -  hgba1c   12-17-19 6.0 On chart   Sleep Study -  CPAP -   Fasting Blood Sugar -  Checks Blood Sugar _____ times a day  Blood Thinner Instructions: Aspirin Instructions: Last Dose:  Anesthesia review:   Patient denies shortness of breath, fever, cough and chest pain at PAT appointment  NONE   Patient verbalized understanding of instructions that were given to them at the PAT appointment. Patient was also instructed that they will need to review over the PAT instructions again at home before surgery.

## 2020-01-13 NOTE — Patient Instructions (Addendum)
DUE TO COVID-19 ONLY ONE VISITOR IS ALLOWED TO COME WITH YOU AND STAY IN THE WAITING ROOM ONLY DURING PRE OP AND PROCEDURE DAY OF SURGERY. TWO  VISITORS  MAY VISIT WITH YOU AFTER SURGERY IN YOUR PRIVATE ROOM DURING VISITING HOURS ONLY!  10 a-8p  YOU NEED TO HAVE A COVID 19 TEST ON__4-10-21_____ @_0940am  ______, THIS TEST MUST BE DONE BEFORE SURGERY, COME  [address], [place] , [id number].  ([place]) ONCE YOUR COVID TEST IS COMPLETED, PLEASE BEGIN THE QUARANTINE INSTRUCTIONS AS OUTLINED IN YOUR HANDOUT.                Bonnie Gray  01/13/2020   Your procedure is scheduled on: 01-19-20   Report to [facility] Main  Entrance   Report to admitting at      1050 AM     Call this number if you have problems the morning of surgery [phone]    Remember: NO SOLID FOOD AFTER MIDNIGHT THE NIGHT PRIOR TO SURGERY. NOTHING BY MOUTH EXCEPT CLEAR LIQUIDS UNTIL  1020 am  . PLEASE FINISH  G2   DRINK PER SURGEON ORDER  WHICH NEEDS TO BE COMPLETED AT       1020 am then nothing by mouth.    CLEAR LIQUID DIET   Foods Allowed                                                                                    Foods Excluded  Coffee and tea, regular and decaf NO CREAMER                           liquids that you cannot  Plain Jell-O any favor except red or purple                                           see through such as: Fruit ices (not with fruit pulp)                                                           milk, soups, orange juice  Iced Popsicles                                                           All solid food Carbonated beverages, regular and diet                                    Cranberry, grape and apple juices Sports drinks like Gatorade Lightly seasoned clear broth or consume(fat free) Sugar, honey syrup  _____________________________________________________________________    BRUSH YOUR TEETH MORNING OF SURGERY AND RINSE YOUR MOUTH  OUT, NO CHEWING GUM CANDY OR MINTS.     Take these medicines the morning of surgery with A SIP OF WATER: eye drops if needed, metoprolol, loratadine  HOLD JARDIANCE DAY BEFORE SURGERY   DO NOT TAKE ANY DIABETIC MEDICATIONS DAY OF YOUR SURGERY                               You may not have any metal on your body including hair pins and              piercings  Do not wear jewelry, make-up, lotions, powders or perfumes, deodorant             Do not wear nail polish on your fingernails.  Do not shave  48 hours prior to surgery.     Do not bring valuables to the hospital. [place].  Contacts, dentures or bridgework may not be worn into surgery.                 Please read over the following fact sheets you were given: _____________________________________________________________________           [facility] - Preparing for Surgery Before surgery, you can play an important role.  Because skin is not sterile, your skin needs to be as free of germs as possible.  You can reduce the number of germs on your skin by washing with CHG (chlorahexidine gluconate) soap before surgery.  CHG is an antiseptic cleaner which kills germs and bonds with the skin to continue killing germs even after washing. Please DO NOT use if you have an allergy to CHG or antibacterial soaps.  If your skin becomes reddened/irritated stop using the CHG and inform your nurse when you arrive at Short Stay. Do not shave (including legs and underarms) for at least 48 hours prior to the first CHG shower.  You may shave your face/neck. Please follow these instructions carefully:  1.  Shower with CHG Soap the night before surgery and the  morning of Surgery.  2.  If you choose to wash your hair, wash your hair first as usual with your  normal  shampoo.  3.  After you shampoo, rinse your hair and body thoroughly to remove the  shampoo.                           4.  Use CHG as you  would any other liquid soap.  You can apply chg directly  to the skin and wash                       Gently with a scrungie or clean washcloth.  5.  Apply the CHG Soap to your body ONLY FROM THE NECK DOWN.   Do not use on face/ open                           Wound or open sores. Avoid contact with eyes, ears mouth and genitals (private parts).                       Wash face,  Development worker, international aid (private  parts) with your normal soap.             6.  Wash thoroughly, paying special attention to the area where your surgery  will be performed.  7.  Thoroughly rinse your body with warm water from the neck down.  8.  DO NOT shower/wash with your normal soap after using and rinsing off  the CHG Soap.                9.  Pat yourself dry with a clean towel.            10.  Wear clean pajamas.            11.  Place clean sheets on your bed the night of your first shower and do not  sleep with pets. Day of Surgery : Do not apply any lotions/deodorants the morning of surgery.  Please wear clean clothes to the hospital/surgery center.  FAILURE TO FOLLOW THESE INSTRUCTIONS MAY RESULT IN THE CANCELLATION OF YOUR SURGERY PATIENT SIGNATURE_________________________________  NURSE SIGNATURE__________________________________  ________________________________________________________________________   [place]nie Gray  An incentive spirometer is a tool that can help keep your lungs clear and active. This tool measures how well you are filling your lungs with each breath. Taking long deep breaths may help reverse or decrease the chance of developing breathing (pulmonary) problems (especially infection) following:  A long period of time when you are unable to move or be active. BEFORE THE PROCEDURE   If the spirometer includes an indicator to show your best effort, your nurse or respiratory therapist will set it to a desired goal.  If possible, sit up straight or lean slightly forward. Try not to slouch.  Hold  the incentive spirometer in an upright position. INSTRUCTIONS FOR USE  1. Sit on the edge of your bed if possible, or sit up as far as you can in bed or on a chair. 2. Hold the incentive spirometer in an upright position. 3. Breathe out normally. 4. Place the mouthpiece in your mouth and seal your lips tightly around it. 5. Breathe in slowly and as deeply as possible, raising the piston or the ball toward the top of the column. 6. Hold your breath for 3-5 seconds or for as long as possible. Allow the piston or ball to fall to the bottom of the column. 7. Remove the mouthpiece from your mouth and breathe out normally. 8. Rest for a few seconds and repeat Steps 1 through 7 at least 10 times every 1-2 hours when you are awake. Take your time and take a few normal breaths between deep breaths. 9. The spirometer may include an indicator to show your best effort. Use the indicator as a goal to work toward during each repetition. 10. After each set of 10 deep breaths, practice coughing to be sure your lungs are clear. If you have an incision (the cut made at the time of surgery), support your incision when coughing by placing a pillow or rolled up towels firmly against it. Once you are able to get out of bed, walk around indoors and cough well. You may stop using the incentive spirometer when instructed by your caregiver.  RISKS AND COMPLICATIONS  Take your time so you do not get dizzy or light-headed.  If you are in pain, you may need to take or ask for pain medication before doing incentive spirometry. It is harder to take a deep breath if you are having pain. AFTER USE  Rest and  breathe slowly and easily.  It can be helpful to keep track of a log of your progress. Your caregiver can provide you with a simple table to help with this. If you are using the spirometer at home, follow these instructions: [place] IF:   You are having difficultly using the spirometer.  You have trouble  using the spirometer as often as instructed.  Your pain medication is not giving enough relief while using the spirometer.  You develop fever of 100.5 F (38.1 C) or higher. SEEK IMMEDIATE MEDICAL CARE IF:   You cough up bloody sputum that had not been present before.  You develop fever of 102 F (38.9 C) or greater.  You develop worsening pain at or near the incision site. MAKE SURE YOU:   Understand these instructions.  Will watch your condition.  Will get help right away if you are not doing well or get worse. Document Released: 02/03/2007 Document Revised: 12/16/2011 Document Reviewed: 04/06/2007 ExitCare Patient Information 2014 Exit[employer], [place].   ________________________________________________________________________  WHAT IS A BLOOD TRANSFUSION? Blood Transfusion Information  A transfusion is the replacement of blood or some of its parts. Blood is made up of multiple cells which provide different functions.  Red blood cells carry oxygen and are used for blood loss replacement.  White blood cells fight against infection.  Platelets control bleeding.  Plasma helps clot blood.  Other blood products are available for specialized needs, such as hemophilia or other clotting disorders. BEFORE THE TRANSFUSION  Who gives blood for transfusions?   Healthy volunteers who are fully evaluated to make sure their blood is safe. This is blood bank blood. Transfusion therapy is the safest it has ever been in the practice of medicine. Before blood is taken from a donor, a complete history is taken to make sure that person has no history of diseases nor engages in risky social behavior (examples are intravenous drug use or sexual activity with multiple partners). The donor's travel history is screened to minimize risk of transmitting infections, such as malaria. The donated blood is tested for signs of infectious diseases, such as HIV and hepatitis. The blood is then tested to be  sure it is compatible with you in order to minimize the chance of a transfusion reaction. If you or a relative donates blood, this is often done in anticipation of surgery and is not appropriate for emergency situations. It takes many days to process the donated blood. RISKS AND COMPLICATIONS Although transfusion therapy is very safe and saves many lives, the main dangers of transfusion include:   Getting an infectious disease.  Developing a transfusion reaction. This is an allergic reaction to something in the blood you were given. Every precaution is taken to prevent this. The decision to have a blood transfusion has been considered carefully by your caregiver before blood is given. Blood is not given unless the benefits outweigh the risks. AFTER THE TRANSFUSION  Right after receiving a blood transfusion, you will usually feel much better and more energetic. This is especially true if your red blood cells have gotten low (anemic). The transfusion raises the level of the red blood cells which carry oxygen, and this usually causes an energy increase.  The nurse administering the transfusion will monitor you carefully for complications. HOME CARE INSTRUCTIONS  No special instructions are needed after a transfusion. You may find your energy is better. Speak with your caregiver about any limitations on activity for underlying diseases you may have. [place]  CARE IF:   Your condition is not improving after your transfusion.  You develop redness or irritation at the intravenous (IV) site. SEEK IMMEDIATE MEDICAL CARE IF:  Any of the following symptoms occur over the next 12 hours:  Shaking chills.  You have a temperature by mouth above 102 F (38.9 C), not controlled by medicine.  Chest, back, or muscle pain.  People around you feel you are not acting correctly or are confused.  Shortness of breath or difficulty breathing.  Dizziness and fainting.  You get a rash or develop  hives.  You have a decrease in urine output.  Your urine turns a dark color or changes to pink, red, or brown. Any of the following symptoms occur over the next 10 days:  You have a temperature by mouth above 102 F (38.9 C), not controlled by medicine.  Shortness of breath.  Weakness after normal activity.  The white part of the eye turns yellow (jaundice).  You have a decrease in the amount of urine or are urinating less often.  Your urine turns a dark color or changes to pink, red, or brown. Document Released: 09/20/2000 Document Revised: 12/16/2011 Document Reviewed: 05/09/2008 [facility] Patient Information 2014 [place], [place].  _______________________________________________________________________

## 2020-01-14 ENCOUNTER — Encounter (HOSPITAL_COMMUNITY)
Admission: RE | Admit: 2020-01-14 | Discharge: 2020-01-14 | Disposition: A | Discharge status: Home / Self Care | Payer: Medicare Other | Source: Ambulatory Visit | Attending: Orthopedic Surgery | Admitting: Orthopedic Surgery

## 2020-01-14 ENCOUNTER — Other Ambulatory Visit: Payer: Self-pay

## 2020-01-14 ENCOUNTER — Encounter (HOSPITAL_COMMUNITY): Payer: Self-pay

## 2020-01-14 ENCOUNTER — Other Ambulatory Visit (HOSPITAL_COMMUNITY): Payer: Medicare Other

## 2020-01-14 DIAGNOSIS — Z01812 Encounter for preprocedural laboratory examination: Secondary | ICD-10-CM | POA: Insufficient documentation

## 2020-01-14 HISTORY — DX: Prediabetes: R73.03

## 2020-01-14 HISTORY — DX: Depression, unspecified: F32.A

## 2020-01-14 LAB — COMPREHENSIVE METABOLIC PANEL
ALT: 19 U/L (ref 0–44)
AST: 17 U/L (ref 15–41)
Albumin: 3.7 g/dL (ref 3.5–5.0)
Alkaline Phosphatase: 53 U/L (ref 38–126)
Anion gap: 12 (ref 5–15)
BUN: 14 mg/dL (ref 8–23)
CO2: 26 mmol/L (ref 22–32)
Calcium: 9.4 mg/dL (ref 8.9–10.3)
Chloride: 97 mmol/L — ABNORMAL LOW (ref 98–111)
Creatinine, Ser: 0.66 mg/dL (ref 0.44–1.00)
GFR calc Af Amer: >60 mL/min (ref >60)
GFR calc non Af Amer: >60 mL/min (ref >60)
Glucose, Bld: 162 mg/dL — ABNORMAL HIGH (ref 70–99)
Potassium: 3.2 mmol/L — ABNORMAL LOW (ref 3.5–5.1)
Sodium: 135 mmol/L (ref 135–145)
Total Bilirubin: 0.8 mg/dL (ref 0.3–1.2)
Total Protein: 7.3 g/dL (ref 6.5–8.1)

## 2020-01-14 LAB — SURGICAL PCR SCREEN
MRSA, PCR: NEGATIVE
Staphylococcus aureus: NEGATIVE

## 2020-01-14 LAB — CBC
HCT: 41 % (ref 36.0–46.0)
Hemoglobin: 13.2 g/dL (ref 12.0–15.0)
MCH: 28.4 pg (ref 26.0–34.0)
MCHC: 32.2 g/dL (ref 30.0–36.0)
MCV: 88.2 fL (ref 80.0–100.0)
Platelets: 325 10*3/uL (ref 150–400)
RBC: 4.65 MIL/uL (ref 3.87–5.11)
RDW: 13.5 % (ref 11.5–15.5)
WBC: 13.5 10*3/uL — ABNORMAL HIGH (ref 4.0–10.5)
nRBC: 0 % (ref 0.0–0.2)

## 2020-01-14 LAB — PROTIME-INR
INR: 1 (ref 0.8–1.2)
Prothrombin Time: 13 seconds (ref 11.4–15.2)

## 2020-01-14 LAB — GLUCOSE, CAPILLARY: Glucose-Capillary: 171 mg/dL — ABNORMAL HIGH (ref 70–99)

## 2020-01-14 LAB — APTT: aPTT: 33 seconds (ref 24–36)

## 2020-01-15 ENCOUNTER — Other Ambulatory Visit (HOSPITAL_COMMUNITY)
Admission: RE | Admit: 2020-01-15 | Discharge: 2020-01-15 | Disposition: A | Discharge status: Home / Self Care | Payer: Medicare Other | Source: Ambulatory Visit | Attending: Orthopedic Surgery | Admitting: Orthopedic Surgery

## 2020-01-15 DIAGNOSIS — Z01812 Encounter for preprocedural laboratory examination: Secondary | ICD-10-CM | POA: Insufficient documentation

## 2020-01-15 DIAGNOSIS — Z20822 Contact with and (suspected) exposure to covid-19: Secondary | ICD-10-CM | POA: Insufficient documentation

## 2020-01-16 LAB — SARS CORONAVIRUS 2 (TAT 6-24 HRS): SARS Coronavirus 2: NEGATIVE

## 2020-01-18 ENCOUNTER — Encounter (HOSPITAL_COMMUNITY): Payer: Self-pay | Admitting: Orthopedic Surgery

## 2020-01-18 NOTE — Anesthesia Preprocedure Evaluation (Addendum)
Anesthesia Evaluation  Patient identified by MRN, date of birth, ID band Patient awake    Reviewed: Allergy & Precautions, NPO status , Patient's Chart, lab work & pertinent test results, reviewed documented beta blocker date and time   History of Anesthesia Complications Negative for: history of anesthetic complications  Airway Mallampati: II  TM Distance: >3 FB Neck ROM: Full    Dental  (+) Missing,    Pulmonary neg pulmonary ROS,    Pulmonary exam normal        Cardiovascular hypertension, Pt. on medications and Pt. on home beta blockers Normal cardiovascular exam  RBBB   Neuro/Psych  Headaches, Anxiety Depression    GI/Hepatic Neg liver ROS, GERD  Controlled,  Endo/Other  diabetes, Type 2, Oral Hypoglycemic Agents  Renal/GU negative Renal ROS  negative genitourinary   Musculoskeletal  (+) Arthritis ,   Abdominal   Peds  Hematology negative hematology ROS (+)   Anesthesia Other Findings Day of surgery medications reviewed with patient.  Reproductive/Obstetrics negative OB ROS                            Anesthesia Physical Anesthesia Plan  ASA: II  Anesthesia Plan: Spinal   Post-op Pain Management:    Induction:   PONV Risk Score and Plan: 3 and Treatment may vary due to age or medical condition, Ondansetron, Dexamethasone and Propofol infusion  Airway Management Planned: Natural Airway and Simple Face Mask  Additional Equipment: None  Intra-op Plan:   Post-operative Plan:   Informed Consent: I have reviewed the patients History and Physical, chart, labs and discussed the procedure including the risks, benefits and alternatives for the proposed anesthesia with the patient or authorized representative who has indicated his/her understanding and acceptance.       Plan Discussed with: CRNA  Anesthesia Plan Comments:        Anesthesia Quick Evaluation

## 2020-01-19 ENCOUNTER — Other Ambulatory Visit: Payer: Self-pay

## 2020-01-19 ENCOUNTER — Telehealth (HOSPITAL_COMMUNITY): Payer: Self-pay | Admitting: *Deleted

## 2020-01-19 ENCOUNTER — Inpatient Hospital Stay (HOSPITAL_COMMUNITY): Payer: Medicare Other

## 2020-01-19 ENCOUNTER — Inpatient Hospital Stay (HOSPITAL_COMMUNITY): Payer: Medicare Other | Admitting: Anesthesiology

## 2020-01-19 ENCOUNTER — Inpatient Hospital Stay (HOSPITAL_COMMUNITY)
Admission: RE | Admit: 2020-01-19 | Discharge: 2020-01-21 | DRG: 470 | Disposition: A | Discharge status: Skilled Nursing Facility | Payer: Medicare Other | Attending: Orthopedic Surgery | Admitting: Orthopedic Surgery

## 2020-01-19 ENCOUNTER — Encounter (HOSPITAL_COMMUNITY): Payer: Self-pay | Admitting: Orthopedic Surgery

## 2020-01-19 ENCOUNTER — Encounter (HOSPITAL_COMMUNITY)
Admission: RE | Disposition: A | Discharge status: Skilled Nursing Facility | Payer: Self-pay | Source: Home / Self Care | Attending: Orthopedic Surgery

## 2020-01-19 DIAGNOSIS — R7303 Prediabetes: Secondary | ICD-10-CM | POA: Diagnosis not present

## 2020-01-19 DIAGNOSIS — Z419 Encounter for procedure for purposes other than remedying health state, unspecified: Secondary | ICD-10-CM

## 2020-01-19 DIAGNOSIS — M6281 Muscle weakness (generalized): Secondary | ICD-10-CM | POA: Diagnosis not present

## 2020-01-19 DIAGNOSIS — Z885 Allergy status to narcotic agent status: Secondary | ICD-10-CM

## 2020-01-19 DIAGNOSIS — R41841 Cognitive communication deficit: Secondary | ICD-10-CM | POA: Diagnosis not present

## 2020-01-19 DIAGNOSIS — Z471 Aftercare following joint replacement surgery: Secondary | ICD-10-CM | POA: Diagnosis not present

## 2020-01-19 DIAGNOSIS — Z7401 Bed confinement status: Secondary | ICD-10-CM | POA: Diagnosis not present

## 2020-01-19 DIAGNOSIS — R0902 Hypoxemia: Secondary | ICD-10-CM | POA: Diagnosis not present

## 2020-01-19 DIAGNOSIS — I4510 Unspecified right bundle-branch block: Secondary | ICD-10-CM | POA: Diagnosis present

## 2020-01-19 DIAGNOSIS — Z7982 Long term (current) use of aspirin: Secondary | ICD-10-CM | POA: Diagnosis not present

## 2020-01-19 DIAGNOSIS — M25752 Osteophyte, left hip: Secondary | ICD-10-CM | POA: Diagnosis present

## 2020-01-19 DIAGNOSIS — E118 Type 2 diabetes mellitus with unspecified complications: Secondary | ICD-10-CM | POA: Diagnosis not present

## 2020-01-19 DIAGNOSIS — M169 Osteoarthritis of hip, unspecified: Secondary | ICD-10-CM | POA: Diagnosis present

## 2020-01-19 DIAGNOSIS — M1612 Unilateral primary osteoarthritis, left hip: Secondary | ICD-10-CM | POA: Diagnosis not present

## 2020-01-19 DIAGNOSIS — R52 Pain, unspecified: Secondary | ICD-10-CM | POA: Diagnosis not present

## 2020-01-19 DIAGNOSIS — Z888 Allergy status to other drugs, medicaments and biological substances status: Secondary | ICD-10-CM

## 2020-01-19 DIAGNOSIS — Z972 Presence of dental prosthetic device (complete) (partial): Secondary | ICD-10-CM

## 2020-01-19 DIAGNOSIS — K219 Gastro-esophageal reflux disease without esophagitis: Secondary | ICD-10-CM | POA: Diagnosis present

## 2020-01-19 DIAGNOSIS — Z87440 Personal history of urinary (tract) infections: Secondary | ICD-10-CM

## 2020-01-19 DIAGNOSIS — Z9181 History of falling: Secondary | ICD-10-CM | POA: Diagnosis not present

## 2020-01-19 DIAGNOSIS — Z79899 Other long term (current) drug therapy: Secondary | ICD-10-CM | POA: Diagnosis not present

## 2020-01-19 DIAGNOSIS — Z20822 Contact with and (suspected) exposure to covid-19: Secondary | ICD-10-CM | POA: Diagnosis not present

## 2020-01-19 DIAGNOSIS — Z791 Long term (current) use of non-steroidal anti-inflammatories (NSAID): Secondary | ICD-10-CM

## 2020-01-19 DIAGNOSIS — Z96642 Presence of left artificial hip joint: Secondary | ICD-10-CM | POA: Diagnosis not present

## 2020-01-19 DIAGNOSIS — I10 Essential (primary) hypertension: Secondary | ICD-10-CM | POA: Diagnosis not present

## 2020-01-19 DIAGNOSIS — Z7984 Long term (current) use of oral hypoglycemic drugs: Secondary | ICD-10-CM | POA: Diagnosis not present

## 2020-01-19 DIAGNOSIS — M2550 Pain in unspecified joint: Secondary | ICD-10-CM | POA: Diagnosis not present

## 2020-01-19 DIAGNOSIS — Z96649 Presence of unspecified artificial hip joint: Secondary | ICD-10-CM

## 2020-01-19 DIAGNOSIS — R2689 Other abnormalities of gait and mobility: Secondary | ICD-10-CM | POA: Diagnosis not present

## 2020-01-19 DIAGNOSIS — E119 Type 2 diabetes mellitus without complications: Secondary | ICD-10-CM | POA: Diagnosis not present

## 2020-01-19 DIAGNOSIS — H3530 Unspecified macular degeneration: Secondary | ICD-10-CM | POA: Diagnosis present

## 2020-01-19 HISTORY — PX: TOTAL HIP ARTHROPLASTY: SHX124

## 2020-01-19 LAB — TYPE AND SCREEN
ABO/RH(D): A POS
Antibody Screen: NEGATIVE

## 2020-01-19 LAB — GLUCOSE, CAPILLARY: Glucose-Capillary: 112 mg/dL — ABNORMAL HIGH (ref 70–99)

## 2020-01-19 SURGERY — ARTHROPLASTY, HIP, TOTAL, ANTERIOR APPROACH
Anesthesia: Spinal | Site: Hip | Laterality: Left

## 2020-01-19 MED ORDER — PROPOFOL 10 MG/ML IV BOLUS
INTRAVENOUS | Status: DC | PRN
  Administered 2020-01-19 (×2): 10 mg via INTRAVENOUS

## 2020-01-19 MED ORDER — TRANEXAMIC ACID-NACL 1000-0.7 MG/100ML-% IV SOLN
1000.0000 mg | Freq: Once | INTRAVENOUS | Status: AC
  Administered 2020-01-19: 20:00:00 1000 mg via INTRAVENOUS
  Filled 2020-01-19: qty 100

## 2020-01-19 MED ORDER — LACTATED RINGERS IV SOLN: INTRAVENOUS | Status: DC

## 2020-01-19 MED ORDER — HYDROCHLOROTHIAZIDE 12.5 MG PO CAPS
12.5000 mg | ORAL_CAPSULE | Freq: Every day | ORAL | Status: DC
  Administered 2020-01-20 – 2020-01-21 (×2): 12.5 mg via ORAL
  Filled 2020-01-19 (×2): qty 1

## 2020-01-19 MED ORDER — DEXAMETHASONE SODIUM PHOSPHATE 10 MG/ML IJ SOLN: 8.0000 mg | Freq: Once | INTRAMUSCULAR | Status: DC

## 2020-01-19 MED ORDER — ONDANSETRON HCL 4 MG/2ML IJ SOLN
INTRAMUSCULAR | Status: AC
  Filled 2020-01-19: qty 2

## 2020-01-19 MED ORDER — HYDROCODONE-ACETAMINOPHEN 7.5-325 MG PO TABS: 1.0000 | ORAL_TABLET | ORAL | Status: DC | PRN

## 2020-01-19 MED ORDER — LORATADINE 10 MG PO TABS
10.0000 mg | ORAL_TABLET | Freq: Every day | ORAL | Status: DC
  Administered 2020-01-20 – 2020-01-21 (×2): 10 mg via ORAL
  Filled 2020-01-19 (×2): qty 1

## 2020-01-19 MED ORDER — POLYVINYL ALCOHOL 1.4 % OP SOLN
1.0000 [drp] | OPHTHALMIC | Status: DC | PRN
  Filled 2020-01-19: qty 15

## 2020-01-19 MED ORDER — ONDANSETRON HCL 4 MG/2ML IJ SOLN
INTRAMUSCULAR | Status: DC | PRN
  Administered 2020-01-19: 4 mg via INTRAVENOUS

## 2020-01-19 MED ORDER — EPHEDRINE SULFATE-NACL 50-0.9 MG/10ML-% IV SOSY
PREFILLED_SYRINGE | INTRAVENOUS | Status: DC | PRN
  Administered 2020-01-19 (×2): 5 mg via INTRAVENOUS

## 2020-01-19 MED ORDER — ALBUMIN HUMAN 5 % IV SOLN
INTRAVENOUS | Status: AC
  Filled 2020-01-19: qty 250

## 2020-01-19 MED ORDER — HYDROCODONE-ACETAMINOPHEN 5-325 MG PO TABS
1.0000 | ORAL_TABLET | ORAL | Status: DC | PRN
  Administered 2020-01-19: 22:00:00 1 via ORAL
  Administered 2020-01-20: 19:00:00 2 via ORAL
  Administered 2020-01-20 – 2020-01-21 (×2): 1 via ORAL
  Filled 2020-01-19: qty 1
  Filled 2020-01-19: qty 2
  Filled 2020-01-19 (×2): qty 1
  Filled 2020-01-19: qty 2
  Filled 2020-01-19: qty 1

## 2020-01-19 MED ORDER — METOPROLOL TARTRATE 50 MG PO TABS
50.0000 mg | ORAL_TABLET | Freq: Every day | ORAL | Status: DC
  Administered 2020-01-20 – 2020-01-21 (×2): 50 mg via ORAL
  Filled 2020-01-19 (×2): qty 1

## 2020-01-19 MED ORDER — 0.9 % SODIUM CHLORIDE (POUR BTL) OPTIME
TOPICAL | Status: DC | PRN
  Administered 2020-01-19: 1000 mL

## 2020-01-19 MED ORDER — ACETAMINOPHEN 500 MG PO TABS
1000.0000 mg | ORAL_TABLET | Freq: Once | ORAL | Status: AC
  Administered 2020-01-19: 11:00:00 1000 mg via ORAL
  Filled 2020-01-19: qty 2

## 2020-01-19 MED ORDER — CEFAZOLIN SODIUM-DEXTROSE 2-4 GM/100ML-% IV SOLN
2.0000 g | INTRAVENOUS | Status: AC
  Administered 2020-01-19: 2 g via INTRAVENOUS
  Filled 2020-01-19: qty 100

## 2020-01-19 MED ORDER — PHENYLEPHRINE HCL-NACL 10-0.9 MG/250ML-% IV SOLN
INTRAVENOUS | Status: DC | PRN
  Administered 2020-01-19: 25 ug/min via INTRAVENOUS

## 2020-01-19 MED ORDER — BUPIVACAINE HCL 0.25 % IJ SOLN
INTRAMUSCULAR | Status: AC
  Filled 2020-01-19: qty 1

## 2020-01-19 MED ORDER — POVIDONE-IODINE 10 % EX SWAB
2.0000 "application " | Freq: Once | CUTANEOUS | Status: AC
  Administered 2020-01-19: 2 via TOPICAL

## 2020-01-19 MED ORDER — METOCLOPRAMIDE HCL 5 MG PO TABS: 5.0000 mg | ORAL_TABLET | Freq: Three times a day (TID) | ORAL | Status: DC | PRN

## 2020-01-19 MED ORDER — BUPIVACAINE HCL 0.25 % IJ SOLN
INTRAMUSCULAR | Status: DC | PRN
  Administered 2020-01-19: 30 mL

## 2020-01-19 MED ORDER — PROMETHAZINE HCL 25 MG/ML IJ SOLN: 6.2500 mg | INTRAMUSCULAR | Status: DC | PRN

## 2020-01-19 MED ORDER — ALBUMIN HUMAN 5 % IV SOLN: INTRAVENOUS | Status: DC | PRN

## 2020-01-19 MED ORDER — FLUTICASONE PROPIONATE 50 MCG/ACT NA SUSP
1.0000 | Freq: Every day | NASAL | Status: DC | PRN
  Filled 2020-01-19: qty 16

## 2020-01-19 MED ORDER — DEXAMETHASONE SODIUM PHOSPHATE 10 MG/ML IJ SOLN
INTRAMUSCULAR | Status: AC
  Filled 2020-01-19: qty 1

## 2020-01-19 MED ORDER — MAGNESIUM CITRATE PO SOLN: 1.0000 | Freq: Once | ORAL | Status: DC | PRN

## 2020-01-19 MED ORDER — POLYETHYL GLYCOL-PROPYL GLYCOL 0.4-0.3 % OP GEL: Freq: Two times a day (BID) | OPHTHALMIC | Status: DC | PRN

## 2020-01-19 MED ORDER — ACETAMINOPHEN 500 MG PO TABS
500.0000 mg | ORAL_TABLET | Freq: Four times a day (QID) | ORAL | Status: AC
  Administered 2020-01-19 – 2020-01-20 (×3): 500 mg via ORAL
  Filled 2020-01-19 (×4): qty 1

## 2020-01-19 MED ORDER — DOCUSATE SODIUM 100 MG PO CAPS
100.0000 mg | ORAL_CAPSULE | Freq: Two times a day (BID) | ORAL | Status: DC
  Administered 2020-01-19 – 2020-01-21 (×4): 100 mg via ORAL
  Filled 2020-01-19 (×4): qty 1

## 2020-01-19 MED ORDER — DIAZEPAM 2 MG PO TABS: 1.0000 mg | ORAL_TABLET | Freq: Three times a day (TID) | ORAL | Status: DC | PRN

## 2020-01-19 MED ORDER — NAPHAZOLINE-GLYCERIN 0.012-0.2 % OP SOLN
1.0000 [drp] | Freq: Four times a day (QID) | OPHTHALMIC | Status: DC | PRN
  Filled 2020-01-19: qty 15

## 2020-01-19 MED ORDER — FENTANYL CITRATE (PF) 100 MCG/2ML IJ SOLN
INTRAMUSCULAR | Status: DC | PRN
  Administered 2020-01-19: 50 ug via INTRAVENOUS

## 2020-01-19 MED ORDER — TRANEXAMIC ACID-NACL 1000-0.7 MG/100ML-% IV SOLN
1000.0000 mg | INTRAVENOUS | Status: AC
  Administered 2020-01-19: 1000 mg via INTRAVENOUS
  Filled 2020-01-19: qty 100

## 2020-01-19 MED ORDER — POLYETHYLENE GLYCOL 3350 17 G PO PACK: 17.0000 g | PACK | Freq: Every day | ORAL | Status: DC | PRN

## 2020-01-19 MED ORDER — MENTHOL 3 MG MT LOZG: 1.0000 | LOZENGE | OROMUCOSAL | Status: DC | PRN

## 2020-01-19 MED ORDER — FENTANYL CITRATE (PF) 100 MCG/2ML IJ SOLN
INTRAMUSCULAR | Status: AC
  Filled 2020-01-19: qty 2

## 2020-01-19 MED ORDER — TRAMADOL HCL 50 MG PO TABS
50.0000 mg | ORAL_TABLET | Freq: Four times a day (QID) | ORAL | Status: DC | PRN
  Administered 2020-01-21: 09:00:00 50 mg via ORAL
  Filled 2020-01-19: qty 1

## 2020-01-19 MED ORDER — ACETAMINOPHEN 10 MG/ML IV SOLN: 1000.0000 mg | Freq: Four times a day (QID) | INTRAVENOUS | Status: DC

## 2020-01-19 MED ORDER — ONDANSETRON HCL 4 MG PO TABS: 4.0000 mg | ORAL_TABLET | Freq: Four times a day (QID) | ORAL | Status: DC | PRN

## 2020-01-19 MED ORDER — METOCLOPRAMIDE HCL 5 MG/ML IJ SOLN: 5.0000 mg | Freq: Three times a day (TID) | INTRAMUSCULAR | Status: DC | PRN

## 2020-01-19 MED ORDER — BISACODYL 10 MG RE SUPP: 10.0000 mg | Freq: Every day | RECTAL | Status: DC | PRN

## 2020-01-19 MED ORDER — PHENOL 1.4 % MT LIQD: 1.0000 | OROMUCOSAL | Status: DC | PRN

## 2020-01-19 MED ORDER — ASPIRIN EC 325 MG PO TBEC
325.0000 mg | DELAYED_RELEASE_TABLET | Freq: Two times a day (BID) | ORAL | Status: DC
  Administered 2020-01-20 – 2020-01-21 (×3): 325 mg via ORAL
  Filled 2020-01-19 (×3): qty 1

## 2020-01-19 MED ORDER — MORPHINE SULFATE (PF) 4 MG/ML IV SOLN: 0.5000 mg | INTRAVENOUS | Status: DC | PRN

## 2020-01-19 MED ORDER — CEFAZOLIN SODIUM-DEXTROSE 2-4 GM/100ML-% IV SOLN
2.0000 g | Freq: Four times a day (QID) | INTRAVENOUS | Status: AC
  Administered 2020-01-19 – 2020-01-20 (×2): 2 g via INTRAVENOUS
  Filled 2020-01-19 (×2): qty 100

## 2020-01-19 MED ORDER — ONDANSETRON HCL 4 MG/2ML IJ SOLN: 4.0000 mg | Freq: Four times a day (QID) | INTRAMUSCULAR | Status: DC | PRN

## 2020-01-19 MED ORDER — FENTANYL CITRATE (PF) 100 MCG/2ML IJ SOLN: 25.0000 ug | INTRAMUSCULAR | Status: DC | PRN

## 2020-01-19 MED ORDER — DEXAMETHASONE SODIUM PHOSPHATE 10 MG/ML IJ SOLN
10.0000 mg | Freq: Once | INTRAMUSCULAR | Status: AC
  Administered 2020-01-20: 08:00:00 10 mg via INTRAVENOUS
  Filled 2020-01-19: qty 1

## 2020-01-19 MED ORDER — PHENYLEPHRINE HCL (PRESSORS) 10 MG/ML IV SOLN
INTRAVENOUS | Status: AC
  Filled 2020-01-19: qty 1

## 2020-01-19 MED ORDER — CHLORHEXIDINE GLUCONATE 4 % EX LIQD
60.0000 mL | Freq: Once | CUTANEOUS | Status: AC
  Administered 2020-01-19: 4 via TOPICAL

## 2020-01-19 MED ORDER — CANAGLIFLOZIN 100 MG PO TABS
100.0000 mg | ORAL_TABLET | Freq: Every day | ORAL | Status: DC
  Administered 2020-01-20 – 2020-01-21 (×2): 100 mg via ORAL
  Filled 2020-01-19 (×3): qty 1

## 2020-01-19 MED ORDER — EPHEDRINE 5 MG/ML INJ
INTRAVENOUS | Status: AC
  Filled 2020-01-19: qty 10

## 2020-01-19 MED ORDER — PROPOFOL 1000 MG/100ML IV EMUL
INTRAVENOUS | Status: AC
  Filled 2020-01-19: qty 100

## 2020-01-19 MED ORDER — PROPOFOL 500 MG/50ML IV EMUL
INTRAVENOUS | Status: DC | PRN
  Administered 2020-01-19: 50 ug/kg/min via INTRAVENOUS

## 2020-01-19 MED ORDER — WATER FOR IRRIGATION, STERILE IR SOLN
Status: DC | PRN
  Administered 2020-01-19: 2000 mL

## 2020-01-19 MED ORDER — SODIUM CHLORIDE 0.9 % IV SOLN: INTRAVENOUS | Status: DC

## 2020-01-19 MED ORDER — BUPIVACAINE IN DEXTROSE 0.75-8.25 % IT SOLN
INTRATHECAL | Status: DC | PRN
  Administered 2020-01-19: 1.8 mL via INTRATHECAL

## 2020-01-19 SURGICAL SUPPLY — 47 items
BAG DECANTER FOR FLEXI CONT (MISCELLANEOUS) ×2 IMPLANT
BAG ZIPLOCK 12X15 (MISCELLANEOUS) IMPLANT
BLADE SAG 18X100X1.27 (BLADE) ×2 IMPLANT
CLSR STERI-STRIP ANTIMIC 1/2X4 (GAUZE/BANDAGES/DRESSINGS) ×4 IMPLANT
COVER PERINEAL POST (MISCELLANEOUS) ×2 IMPLANT
COVER SURGICAL LIGHT HANDLE (MISCELLANEOUS) ×2 IMPLANT
COVER WAND RF STERILE (DRAPES) ×2 IMPLANT
CUP ACETBLR 48 OD SECTOR II (Hips) ×2 IMPLANT
DECANTER SPIKE VIAL GLASS SM (MISCELLANEOUS) ×2 IMPLANT
DRAPE STERI IOBAN 125X83 (DRAPES) ×2 IMPLANT
DRAPE U-SHAPE 47X51 STRL (DRAPES) ×4 IMPLANT
DRESSING AQUACEL AG SP 3.5X10 (GAUZE/BANDAGES/DRESSINGS) ×1 IMPLANT
DRSG ADAPTIC 3X8 NADH LF (GAUZE/BANDAGES/DRESSINGS) ×2 IMPLANT
DRSG AQUACEL AG ADV 3.5X10 (GAUZE/BANDAGES/DRESSINGS) ×2 IMPLANT
DRSG AQUACEL AG SP 3.5X10 (GAUZE/BANDAGES/DRESSINGS) ×2
DURAPREP 26ML APPLICATOR (WOUND CARE) ×2 IMPLANT
ELECT REM PT RETURN 15FT ADLT (MISCELLANEOUS) ×2 IMPLANT
EVACUATOR 1/8 PVC DRAIN (DRAIN) ×2 IMPLANT
GLOVE BIO SURGEON STRL SZ 6 (GLOVE) IMPLANT
GLOVE BIO SURGEON STRL SZ7 (GLOVE) IMPLANT
GLOVE BIO SURGEON STRL SZ8 (GLOVE) ×2 IMPLANT
GLOVE BIOGEL PI IND STRL 6.5 (GLOVE) IMPLANT
GLOVE BIOGEL PI IND STRL 7.0 (GLOVE) IMPLANT
GLOVE BIOGEL PI IND STRL 8 (GLOVE) ×1 IMPLANT
GLOVE BIOGEL PI INDICATOR 6.5 (GLOVE)
GLOVE BIOGEL PI INDICATOR 7.0 (GLOVE)
GLOVE BIOGEL PI INDICATOR 8 (GLOVE) ×1
GOWN STRL REUS W/TWL LRG LVL3 (GOWN DISPOSABLE) ×2 IMPLANT
GOWN STRL REUS W/TWL XL LVL3 (GOWN DISPOSABLE) IMPLANT
HEAD FEM STD 28X+1.5 STRL (Hips) ×2 IMPLANT
HOLDER FOLEY CATH W/STRAP (MISCELLANEOUS) ×2 IMPLANT
KIT TURNOVER KIT A (KITS) IMPLANT
LINER MARATHON 28 48 (Hips) ×2 IMPLANT
MANIFOLD NEPTUNE II (INSTRUMENTS) ×2 IMPLANT
PACK ANTERIOR HIP CUSTOM (KITS) ×2 IMPLANT
PENCIL SMOKE EVACUATOR COATED (MISCELLANEOUS) ×2 IMPLANT
STEM FEM ACTIS STD SZ2 (Stem) ×2 IMPLANT
STRIP CLOSURE SKIN 1/2X4 (GAUZE/BANDAGES/DRESSINGS) ×2 IMPLANT
SUT ETHIBOND NAB CT1 #1 30IN (SUTURE) ×2 IMPLANT
SUT MNCRL AB 4-0 PS2 18 (SUTURE) ×2 IMPLANT
SUT STRATAFIX 0 PDS 27 VIOLET (SUTURE) ×2
SUT VIC AB 2-0 CT1 27 (SUTURE) ×4
SUT VIC AB 2-0 CT1 TAPERPNT 27 (SUTURE) ×4 IMPLANT
SUTURE STRATFX 0 PDS 27 VIOLET (SUTURE) ×1 IMPLANT
SYR 50ML LL SCALE MARK (SYRINGE) IMPLANT
TRAY FOLEY MTR SLVR 16FR STAT (SET/KITS/TRAYS/PACK) ×2 IMPLANT
YANKAUER SUCT BULB TIP 10FT TU (MISCELLANEOUS) ×2 IMPLANT

## 2020-01-19 NOTE — Progress Notes (Signed)
Md aware of Bp in pacu, SBP >160.  No orders received.  States pt may tx to floor at this time. Per Dr Daiva Huge

## 2020-01-19 NOTE — Anesthesia Postprocedure Evaluation (Signed)
Anesthesia Post Note  Patient: Bonnie Gray  Procedure(s) Performed: TOTAL HIP ARTHROPLASTY ANTERIOR APPROACH (Left Hip)     Patient location during evaluation: PACU Anesthesia Type: Spinal Level of consciousness: awake and alert and oriented Pain management: pain level controlled Vital Signs Assessment: post-procedure vital signs reviewed and stable Respiratory status: spontaneous breathing, nonlabored ventilation and respiratory function stable Cardiovascular status: blood pressure returned to baseline Postop Assessment: no apparent nausea or vomiting and spinal receding Anesthetic complications: no    Last Vitals:  Vitals:   01/19/20 1500 01/19/20 1515  BP: (!) 168/70 (!) 159/69  Pulse: (!) 56 (!) 51  Resp: 15 13  Temp:    SpO2: 97% 98%    Last Pain:  Vitals:   01/19/20 1515  TempSrc:   PainSc: 0-No pain                 Brennan Bailey

## 2020-01-19 NOTE — Plan of Care (Signed)
Plan of care 

## 2020-01-19 NOTE — Interval H&P Note (Signed)
History and Physical Interval Note:  01/19/2020 11:52 AM  Bonnie Gray  has presented today for surgery, with the diagnosis of Left hip osteoarthritis.  The various methods of treatment have been discussed with the patient and family. After consideration of risks, benefits and other options for treatment, the patient has consented to  Procedure(s): TOTAL HIP ARTHROPLASTY ANTERIOR APPROACH (Left) as a surgical intervention.  The patient's history has been reviewed, patient examined, no change in status, stable for surgery.  I have reviewed the patient's chart and labs.  Questions were answered to the patient's satisfaction.     Pilar Plate Ishi Danser

## 2020-01-19 NOTE — Anesthesia Procedure Notes (Signed)
Spinal  Patient location during procedure: OR Start time: 01/19/2020 12:01 PM End time: 01/19/2020 12:06 PM Staffing Performed: anesthesiologist  Anesthesiologist: Brennan Bailey, MD Preanesthetic Checklist Completed: patient identified, IV checked, risks and benefits discussed, surgical consent, monitors and equipment checked, pre-op evaluation and timeout performed Spinal Block Patient position: sitting Prep: DuraPrep and site prepped and draped Patient monitoring: continuous pulse ox, blood pressure and heart rate Approach: right paramedian Location: L3-4 Injection technique: single-shot Needle Needle type: Whitacre  Needle gauge: 22 G Needle length: 9 cm Additional Notes Risks, benefits, and alternative discussed. Patient gave consent to procedure. Prepped and draped in sitting position. Patient sedated but responsive to voice. First attempts by CRNA at L2-3 unsuccessful with Pencan 24g. Next attempt by myself unable to access interspinous space at L3-4 midline, clear CSF obtained at L3-4 right paramedian. Positive terminal aspiration. No pain or paraesthesias with injection. Patient tolerated procedure well. Vital signs stable. Tawny Asal, MD

## 2020-01-19 NOTE — Op Note (Signed)
OPERATIVE REPORT- TOTAL HIP ARTHROPLASTY   PREOPERATIVE DIAGNOSIS: Osteoarthritis of the Left hip.   POSTOPERATIVE DIAGNOSIS: Osteoarthritis of the Left  hip.   PROCEDURE: Left total hip arthroplasty, anterior approach.   SURGEON: Gaynelle Arabian, MD   ASSISTANT: Theresa Duty, PA-C  ANESTHESIA:  Spinal  ESTIMATED BLOOD LOSS:-800 mL    DRAINS: Hemovac x1.   COMPLICATIONS: None   CONDITION: PACU - hemodynamically stable.   BRIEF CLINICAL NOTE: REYANNA Gray is a 79 y.o. female who has advanced end-  stage arthritis of their Left  hip with progressively worsening pain and  dysfunction.The patient has failed nonoperative management and presents for  total hip arthroplasty.   PROCEDURE IN DETAIL: After successful administration of spinal  anesthetic, the traction boots for the [facility] bed were placed on both  feet and the patient was placed onto the [facility] bed, boots placed into the leg  holders. The Left hip was then isolated from the perineum with plastic  drapes and prepped and draped in the usual sterile fashion. ASIS and  greater trochanter were marked and a oblique incision was made, starting  at about 1 cm lateral and 2 cm distal to the ASIS and coursing towards  the anterior cortex of the femur. The skin was cut with a 10 blade  through subcutaneous tissue to the level of the fascia overlying the  tensor fascia lata muscle. The fascia was then incised in line with the  incision at the junction of the anterior third and posterior 2/3rd. The  muscle was teased off the fascia and then the interval between the TFL  and the rectus was developed. The Hohmann retractor was then placed at  the top of the femoral neck over the capsule. The vessels overlying the  capsule were cauterized and the fat on top of the capsule was removed.  A Hohmann retractor was then placed anterior underneath the rectus  femoris to give exposure to the entire anterior capsule. A T-shaped   capsulotomy was performed. The edges were tagged and the femoral head  was identified.       Osteophytes are removed off the superior acetabulum.  The femoral neck was then cut in situ with an oscillating saw. Traction  was then applied to the left lower extremity utilizing the [facility]  traction. The femoral head was then removed. Retractors were placed  around the acetabulum and then circumferential removal of the labrum was  performed. Osteophytes were also removed. Reaming starts at 45 mm to  medialize and  Increased in 2 mm increments to 47 mm. We reamed in  approximately 40 degrees of abduction, 20 degrees anteversion. A 48 mm  pinnacle acetabular shell was then impacted in anatomic position under  fluoroscopic guidance with excellent purchase. We did not need to place  any additional dome screws. A 28 mm neutral + 4 marathon liner was then  placed into the acetabular shell.       The femoral lift was then placed along the lateral aspect of the femur  just distal to the vastus ridge. The leg was  externally rotated and capsule  was stripped off the inferior aspect of the femoral neck down to the  level of the lesser trochanter, this was done with electrocautery. The femur was lifted after this was performed. The  leg was then placed in an extended and adducted position essentially delivering the femur. We also removed the capsule superiorly and the piriformis from the piriformis  fossa to gain excellent exposure of the  proximal femur. Rongeur was used to remove some cancellous bone to get  into the lateral portion of the proximal femur for placement of the  initial starter reamer. The starter broaches was placed  the starter broach  and was shown to go down the center of the canal. Broaching  with the Actis system was then performed starting at size 0  coursing  Up to size 2. A size 2 had excellent torsional and rotational  and axial stability. The trial standard offset neck was then  placed  with a 28 + 1.5 trial head. The hip was then reduced. We confirmed that  the stem was in the canal both on AP and lateral x-rays. It also has excellent sizing. The hip was reduced with outstanding stability through full extension and full external rotation.. AP pelvis was taken and the leg lengths were measured and found to be equal. Hip was then dislocated again and the femoral head and neck removed. The  femoral broach was removed. Size 2 Actis stem with a standard offset  neck was then impacted into the femur following native anteversion. Has  excellent purchase in the canal. Excellent torsional and rotational and  axial stability. It is confirmed to be in the canal on AP and lateral  fluoroscopic views. The 28 + 1.5 metal head was placed and the hip  reduced with outstanding stability. Again AP pelvis was taken and it  confirmed that the leg lengths were equal. The wound was then copiously  irrigated with saline solution and the capsule reattached and repaired  with Ethibond suture. 30 ml of .25% Bupivicaine was  injected into the capsule and into the edge of the tensor fascia lata as well as subcutaneous tissue. The fascia overlying the tensor fascia lata was then closed with a running #1 V-Loc. Subcu was closed with interrupted 2-0 Vicryl and subcuticular running 4-0 Monocryl. Incision was cleaned  and dried. Steri-Strips and a bulky sterile dressing applied. Hemovac  drain was hooked to suction and then the patient was awakened and transported to  recovery in stable condition.        Please note that a surgical assistant was a medical necessity for this procedure to perform it in a safe and expeditious manner. Assistant was necessary to provide appropriate retraction of vital neurovascular structures and to prevent femoral fracture and allow for anatomic placement of the prosthesis.  Gaynelle Arabian, M.D.

## 2020-01-19 NOTE — Anesthesia Procedure Notes (Signed)
Procedure Name: MAC Date/Time: 01/19/2020 11:51 AM Performed by: Maxwell Caul, CRNA Pre-anesthesia Checklist: Patient identified, Emergency Drugs available, Suction available and Patient being monitored Oxygen Delivery Method: Simple face mask Preoxygenation: Pre-oxygenation with 100% oxygen

## 2020-01-19 NOTE — Evaluation (Signed)
Physical Therapy Evaluation Patient Details Name: Bonnie Gray MRN: [id number] DOB: 12-28-1940 Today's Date: 01/19/2020   History of Present Illness  Patient is 79 y.o. female s/p Lt THA anterior approach on 01/19/20 with PMH significant for OA, Rt BBB, HTN, GERD, depression, anxiety, chroinic pain, Bil TKA's in 2016.    Clinical Impression  Bonnie Gray is a 79 y.o. female POD 0 s/p Lt THA. Patient reports she has been requiring increased assistance with ADL's and mobility in last 2 months. She has been ambulating short household distance with RW and requires assistance from her daughter to care for her animals, make meals, and to get to doctors appointments. She states she has fall 3 times in the last 3 months and her daughter and son-in-law cannot stay with her at home to help. Patient is now limited by functional impairments (see PT problem list below) and requires mod assist for bed mobility and transfers with RW. Patient was able to take small lateral steps with RW and moderate assist at EOB. She was limited by weakness and pain with Lt LE weight bearing. Patient will benefit from continued skilled PT interventions to address impairments and progress towards PLOF.Recommending SNF level therapy for follow up as pt has been primarily sedentary for last 2 months, has had multiple falls, and lives alone with limited assistance.  Acute PT will follow to progress mobility and stair training as able.     Follow Up Recommendations SNF;Supervision/Assistance - 24 hour    Equipment Recommendations  None recommended by PT(pt has all DME at home)    Recommendations for Other Services OT consult     Precautions / Restrictions Precautions Precautions: Fall Restrictions Weight Bearing Restrictions: No Other Position/Activity Restrictions: WBAT      Mobility  Bed Mobility Overal bed mobility: Needs Assistance Bed Mobility: Supine to Sit;Sit to Supine     Supine to sit: Mod  assist;HOB elevated Sit to supine: Mod assist;+2 for physical assistance;HOB elevated   General bed mobility comments: pt requires cues to reach for bed rail and to walk LE's to EOB. Assist needed for bil LE's and mod assist to raise trunk upright with cues to press through Rt elbow. Mod assist to scoot forward to EOB and place feet on floor. Mod assist with 2+ for return to supine to control lowering trunk and raise bil LE's into bed.  Transfers Overall transfer level: Needs assistance Equipment used: Rolling walker (2 wheeled) Transfers: Sit to/from Stand Sit to Stand: Mod assist;From elevated surface;+2 safety/equipment         General transfer comment: pt requires elevated surface to complete power up and mod assist to initiate and rise to RW. Pt unsteady and shaking lightly initially but became more stable after ~30 seconds.  Ambulation/Gait Ambulation/Gait assistance: Mod assist;+2 safety/equipment Gait Distance (Feet): 2 Feet Assistive device: Rolling walker (2 wheeled) Gait Pattern/deviations: Step-to pattern;Decreased stride length;Decreased step length - left;Decreased step length - right;Decreased weight shift to left;Shuffle;Trunk flexed Gait velocity: slow   General Gait Details: pt required cues to manage RW and take small side steps in walker at EOB. Pt unable to lift Rt LE off ground and weight bear on Lt LE due to pain. Pt was able to raise Lt LE off ground to step to the Rt.  Stairs       Wheelchair Mobility    Modified Rankin (Stroke Patients Only)       Balance Overall balance assessment: Needs assistance;History of Falls Sitting-balance support: Feet  supported Sitting balance-Leahy Scale: Fair     Standing balance support: During functional activity;Bilateral upper extremity supported Standing balance-Leahy Scale: Poor Standing balance comment: heavily reliant on UE support and external support            Pertinent Vitals/Pain Pain Assessment:  0-10 Pain Score: 3  Pain Location: Lt hip Pain Descriptors / Indicators: Aching;Discomfort;Sore Pain Intervention(s): Limited activity within patient's tolerance;Monitored during session;Repositioned;Ice applied    Home Living Family/patient expects to be discharged to:: Private residence Living Arrangements: Alone Available Help at Discharge: Family;Available PRN/intermittently(daughter and son-in-law) Type of Home: House Home Access: Stairs to enter Entrance Stairs-Rails: None Entrance Stairs-Number of Steps: 1 Home Layout: One level;Laundry or work area in [place]: Wheelchair - [employer] - 2 wheels;Bedside commode;Shower seat;Walker - 4 wheels;Cane - quad(lift chair) Additional Comments: pt lives alone and has 3 indoor dogs and 2 outdoor cats at home. She reports sicne February she has been very limited with mobility and is using a RW to walk short distances in home to get from bed to bathroom. Pt reports she has fallen 3x in the last 2 months, 1x in bathroom cleaning, 1x sliding off the couch, and 1x she couldn't get up from teh kitchen chair and had to call her son-in-law to help her.    Prior Function Level of Independence: Needs assistance;Independent with assistive device(s)   Gait / Transfers Assistance Needed: pt has been ambulating short household distances with RW. she states she was sliding her feet and has not been abel to really lift them up  for the last 2 months. She has chairs all around her home to be able to sit in and rest. She also is using a lift chair to sleep in and has to raise it up to stand and get to walker. For long distances out of home pt has a wheelchair that her daughter keeps in the car to get to doctors appointments.  ADL's / Homemaking Assistance Needed: pt has been taking sink baths in bathroom because she cannot get into shower. pt's duaghter comes to help ~2 hours in the morning and evening to help with the pets.   Comments: Pt at  first reports she has been using quad cane for last 2 months to mobilize and that she has been whelchair bound. She also initially states it is a powered wheelchair. Later in session with more questions pt states it is not a power chair but a manual and her daughter keeps it and that she has been using a RW.      Hand Dominance   Dominant Hand: Right    Extremity/Trunk Assessment   Upper Extremity Assessment Upper Extremity Assessment: Overall WFL for tasks assessed    Lower Extremity Assessment Lower Extremity Assessment: Overall WFL for tasks assessed    Cervical / Trunk Assessment Cervical / Trunk Assessment: Other exceptions;Normal Cervical / Trunk Exceptions: large body habitus  Communication   Communication: No difficulties  Cognition Arousal/Alertness: Awake/alert Behavior During Therapy: WFL for tasks assessed/performed Overall Cognitive Status: Within Functional Limits for tasks assessed             General Comments      Exercises     Assessment/Plan    PT Assessment Patient needs continued PT services  PT Problem List Decreased strength;Decreased range of motion;Decreased activity tolerance;Decreased balance;Decreased mobility;Decreased knowledge of use of DME;Decreased knowledge of precautions;Obesity       PT Treatment Interventions DME instruction;Gait training;Stair training;Functional mobility training;Therapeutic activities;Therapeutic exercise;Balance  training;Patient/family education    PT Goals (Current goals can be found in the Care Plan section)  Acute Rehab PT Goals Patient Stated Goal: to get back to walking PT Goal Formulation: With patient Time For Goal Achievement: 01/26/20 Potential to Achieve Goals: Good    Frequency 7X/week   Barriers to discharge Decreased caregiver support;Inaccessible home environment pt lives alone and has step to enter her home. She has a daughter who assists some with caring for animals and house cleaning but  is not available readily.       AM-PAC PT "6 Clicks" Mobility  Outcome Measure Help needed turning from your back to your side while in a flat bed without using bedrails?: A Lot Help needed moving from lying on your back to sitting on the side of a flat bed without using bedrails?: A Lot Help needed moving to and from a bed to a chair (including a wheelchair)?: A Lot Help needed standing up from a chair using your arms (e.g., wheelchair or bedside chair)?: A Lot Help needed to walk in hospital room?: A Lot Help needed climbing 3-5 steps with a railing? : Total 6 Click Score: 11    End of Session Equipment Utilized During Treatment: Gait belt Activity Tolerance: Patient tolerated treatment well Patient left: in bed;with call bell/phone within reach;with bed alarm set;with nursing/sitter in room Nurse Communication: Mobility status PT Visit Diagnosis: Muscle weakness (generalized) (M62.81);History of falling (Z91.81);Difficulty in walking, not elsewhere classified (R26.2)    Time: [id number] PT Time Calculation (min) (ACUTE ONLY): 33 min   Charges:   PT Evaluation $PT Eval Moderate Complexity: 1 Mod PT Treatments $Therapeutic Activity: 8-22 mins       Verner Mould, DPT Physical Therapist with [facility] [phone]  01/19/2020 7:36 PM

## 2020-01-19 NOTE — Transfer of Care (Signed)
Immediate Anesthesia Transfer of Care Note  Patient: Bonnie Gray  Procedure(s) Performed: TOTAL HIP ARTHROPLASTY ANTERIOR APPROACH (Left Hip)  Patient Location: PACU  Anesthesia Type:Spinal  Level of Consciousness: awake, alert  and oriented  Airway & Oxygen Therapy: Patient Spontanous Breathing and Patient connected to face mask oxygen  Post-op Assessment: Report given to RN and Post -op Vital signs reviewed and stable  Post vital signs: Reviewed and stable  Last Vitals:  Vitals Value Taken Time  BP 162/69 01/19/20 1352  Temp    Pulse 69 01/19/20 1356  Resp 19 01/19/20 1356  SpO2 98 % 01/19/20 1356  Vitals shown include unvalidated device data.  Last Pain:  Vitals:   01/19/20 1113  TempSrc:   PainSc: 8       Patients Stated Pain Goal: 4 ([id number])  Complications: No apparent anesthesia complications

## 2020-01-20 ENCOUNTER — Encounter: Payer: Self-pay | Admitting: *Deleted

## 2020-01-20 LAB — BASIC METABOLIC PANEL
Anion gap: 10 (ref 5–15)
BUN: 8 mg/dL (ref 8–23)
CO2: 24 mmol/L (ref 22–32)
Calcium: 8.4 mg/dL — ABNORMAL LOW (ref 8.9–10.3)
Chloride: 100 mmol/L (ref 98–111)
Creatinine, Ser: 0.53 mg/dL (ref 0.44–1.00)
GFR calc Af Amer: >60 mL/min (ref >60)
GFR calc non Af Amer: >60 mL/min (ref >60)
Glucose, Bld: 141 mg/dL — ABNORMAL HIGH (ref 70–99)
Potassium: 3.7 mmol/L (ref 3.5–5.1)
Sodium: 134 mmol/L — ABNORMAL LOW (ref 135–145)

## 2020-01-20 LAB — CBC
HCT: 30.7 % — ABNORMAL LOW (ref 36.0–46.0)
Hemoglobin: 10.1 g/dL — ABNORMAL LOW (ref 12.0–15.0)
MCH: 29.2 pg (ref 26.0–34.0)
MCHC: 32.9 g/dL (ref 30.0–36.0)
MCV: 88.7 fL (ref 80.0–100.0)
Platelets: 237 10*3/uL (ref 150–400)
RBC: 3.46 MIL/uL — ABNORMAL LOW (ref 3.87–5.11)
RDW: 13.1 % (ref 11.5–15.5)
WBC: 15.2 10*3/uL — ABNORMAL HIGH (ref 4.0–10.5)
nRBC: 0 % (ref 0.0–0.2)

## 2020-01-20 MED ORDER — TRAMADOL HCL 50 MG PO TABS: 50.0000 mg | ORAL_TABLET | Freq: Four times a day (QID) | ORAL | 0 refills | Status: DC | PRN

## 2020-01-20 MED ORDER — HYDROCODONE-ACETAMINOPHEN 5-325 MG PO TABS: 1.0000 | ORAL_TABLET | Freq: Four times a day (QID) | ORAL | 0 refills | Status: DC | PRN

## 2020-01-20 MED ORDER — ASPIRIN 325 MG PO TBEC: 325.0000 mg | DELAYED_RELEASE_TABLET | Freq: Two times a day (BID) | ORAL | 0 refills | Status: AC

## 2020-01-20 NOTE — TOC Progression Note (Addendum)
Transition of Care [facility]) - Progression Note    Patient Details  Name: Bonnie Gray MRN: [id number] Date of Birth: 04-10-1941  [facility]) CM/SW [place], [place] Phone Number: 01/20/2020, 4:47 PM  Clinical Narrative:    Patient accepted [facility].   BCBS-Medicare Authorization and Covid-19 test pending discharge.    Expected Discharge Plan: [place] Barriers to Discharge: Continued Medical Work up, Ship broker  Expected Discharge Plan and Services Expected Discharge Plan: [place] In-house Referral: NA Discharge Planning Services: CM Consult Post Acute Care Choice: [place] Living arrangements for the past 2 months: Single Family Home                                       Social Determinants of Health (SDOH) Interventions    Readmission Risk Interventions No flowsheet data found.

## 2020-01-20 NOTE — Evaluation (Signed)
Occupational Therapy Evaluation Patient Details Name: Bonnie Gray MRN: [id number] DOB: 1941/03/29 Today's Date: 01/20/2020    History of Present Illness Patient is 79 y.o. female s/p Lt THA anterior approach on 01/19/20 with PMH significant for OA, Rt BBB, HTN, GERD, depression, anxiety, chroinic pain, Bil TKA's in 2016.   Clinical Impression   Patient with listed deficits below impacting independence with self care, requiring max to total A for LB dressing/bathing, mod A for commode transfer and total A for peri care/clothing management. Recommend continued acute OT services for ADL and transfer training to maximize patient safety and independence with self care. Recommend SNF as patient is high fall risk with limited assistance available for home as patient's DTR works.     Follow Up Recommendations  SNF    Equipment Recommendations  None recommended by OT(defer to next venue)       Precautions / Restrictions Precautions Precautions: Fall Restrictions Weight Bearing Restrictions: No      Mobility Bed Mobility Overal bed mobility: Needs Assistance Bed Mobility: Supine to Sit     Supine to sit: Mod assist;HOB elevated     General bed mobility comments: verbal cues for sequencing and mod A for managing L LE and trunk support  Transfers Overall transfer level: Needs assistance Equipment used: Rolling walker (2 wheeled) Transfers: Sit to/from Stand Sit to Stand: Mod assist         General transfer comment: cues for body mechanics    Balance Overall balance assessment: Needs assistance;History of Falls Sitting-balance support: Feet supported Sitting balance-Leahy Scale: Fair     Standing balance support: During functional activity;Bilateral upper extremity supported Standing balance-Leahy Scale: Poor Standing balance comment: heavily reliant on UE support and external support                           ADL either performed or assessed with  clinical judgement   ADL Overall ADL's : Needs assistance/impaired     Grooming: Set up;Sitting   Upper Body Bathing: Set up;Sitting   Lower Body Bathing: Maximal assistance;Sitting/lateral leans   Upper Body Dressing : Set up;Sitting   Lower Body Dressing: Total assistance;Sitting/lateral leans   Toilet Transfer: Moderate assistance;Cueing for safety;Cueing for sequencing;BSC;RW;Ambulation Toilet Transfer Details (indicate cue type and reason): increased time due to difficulty advancing LEs, taking very small steps with cues to utilize UEs  Toileting- Water quality scientist and Hygiene: Total assistance;Sit to/from stand Toileting - Clothing Manipulation Details (indicate cue type and reason): peri care and donning brief     Functional mobility during ADLs: Moderate assistance;Rolling walker;Cueing for sequencing;Cueing for safety General ADL Comments: patient requires increased assistance with self care due to decreased strength, activity tolerance, safety, balance and pain with mobility     Vision Baseline Vision/History: Wears glasses Wears Glasses: At all times              Pertinent Vitals/Pain Pain Assessment: Faces Faces Pain Scale: Hurts whole lot Pain Location: L hip with standing Pain Descriptors / Indicators: Grimacing;Aching Pain Intervention(s): Monitored during session;Ice applied     Hand Dominance Right   Extremity/Trunk Assessment Upper Extremity Assessment Upper Extremity Assessment: Generalized weakness   Lower Extremity Assessment Lower Extremity Assessment: Defer to PT evaluation       Communication Communication Communication: No difficulties   Cognition Arousal/Alertness: Awake/alert Behavior During Therapy: WFL for tasks assessed/performed Overall Cognitive Status: Within Functional Limits for tasks assessed  Home Living Family/patient expects to be discharged to::  Private residence Living Arrangements: Alone Available Help at Discharge: Family;Available PRN/intermittently Type of Home: House Home Access: Stairs to enter [employer] of Steps: 1 Entrance Stairs-Rails: None Home Layout: One level;Laundry or work area in basement     [employer]: Occupational psychologist: Handicapped height Bathroom Accessibility: Yes How Accessible: Accessible via [place]: Wheelchair - [employer] - 2 wheels;Bedside commode;Shower seat;Walker - 4 wheels;Cane - quad;Other (comment)(lift chair)   Additional Comments: pt lives alone and has 3 indoor dogs and 2 outdoor cats at home. She reports sicne February she has been very limited with mobility and is using a RW to walk short distances in home to get from bed to bathroom. Pt reports she has fallen 3x in the last 2 months, 1x in bathroom cleaning, 1x sliding off the couch, and 1x she couldn't get up from teh kitchen chair and had to call her son-in-law to help her.      Prior Functioning/Environment Level of Independence: Needs assistance;Independent with assistive device(s)  Gait / Transfers Assistance Needed: pt has been ambulating short household distances with RW. she states she was sliding her feet and has not been abel to really lift them up  for the last 2 months. She has chairs all around her home to be able to sit in and rest. She also is using a lift chair to sleep in and has to raise it up to stand and get to walker. For long distances out of home pt has a wheelchair that her daughter keeps in the car to get to doctors appointments. ADL's / Homemaking Assistance Needed: pt has been taking sink baths in bathroom because she cannot get into shower. pt's duaghter comes to help ~2 hours in the morning and evening to help with the pets.             OT Problem List: Decreased strength;Decreased activity tolerance;Impaired balance (sitting and/or standing);Decreased  safety awareness;Decreased knowledge of use of DME or AE;Obesity;Pain      OT Treatment/Interventions: Self-care/ADL training;Therapeutic exercise;DME and/or AE instruction;Therapeutic activities;Patient/family education;Balance training    OT Goals(Current goals can be found in the care plan section) Acute Rehab OT Goals Patient Stated Goal: to get back to walking OT Goal Formulation: With patient Time For Goal Achievement: 02/03/20 Potential to Achieve Goals: Good  OT Frequency: Min 2X/week    AM-PAC OT "6 Clicks" Daily Activity     Outcome Measure Help from another person eating meals?: None Help from another person taking care of personal grooming?: A Little Help from another person toileting, which includes using toliet, bedpan, or urinal?: A Lot Help from another person bathing (including washing, rinsing, drying)?: A Lot Help from another person to put on and taking off regular upper body clothing?: A Little Help from another person to put on and taking off regular lower body clothing?: Total 6 Click Score: 15   End of Session Equipment Utilized During Treatment: Rolling walker Nurse Communication: Mobility status  Activity Tolerance: Patient tolerated treatment well;Patient limited by pain Patient left: in chair;with call bell/phone within reach  OT Visit Diagnosis: Unsteadiness on feet (R26.81);Other abnormalities of gait and mobility (R26.89);Repeated falls (R29.6);Muscle weakness (generalized) (M62.81);Pain Pain - Right/Left: Left Pain - part of body: Hip                Time: [id number] OT Time Calculation (min): 40 min Charges:  OT General Charges $OT Visit: 1 Visit  OT Evaluation $OT Eval Moderate Complexity: 1 Mod OT Treatments $Self Care/Home Management : 23-37 mins  Delbert Phenix OT Pager: [place] 01/20/2020, 12:49 PM

## 2020-01-20 NOTE — Plan of Care (Signed)
  Problem: Health Behavior/Discharge Planning: Goal: Ability to manage health-related needs will improve Outcome: Progressing   Problem: Clinical Measurements: Goal: Ability to maintain clinical measurements within normal limits will improve Outcome: Progressing Goal: Will remain free from infection Outcome: Progressing Goal: Diagnostic test results will improve Outcome: Progressing Goal: Respiratory complications will improve Outcome: Progressing Goal: Cardiovascular complication will be avoided Outcome: Progressing   Problem: Activity: Goal: Risk for activity intolerance will decrease Outcome: Progressing   Problem: Coping: Goal: Level of anxiety will decrease Outcome: Progressing   Problem: Elimination: Goal: Will not experience complications related to bowel motility Outcome: Progressing Goal: Will not experience complications related to urinary retention Outcome: Progressing   Problem: Pain Managment: Goal: General experience of comfort will improve Outcome: Progressing   Problem: Skin Integrity: Goal: Risk for impaired skin integrity will decrease Outcome: Progressing   Problem: Education: Goal: Understanding of discharge needs will improve Outcome: Progressing Goal: Individualized Educational Video(s) Outcome: Progressing   Problem: Activity: Goal: Ability to avoid complications of mobility impairment will improve Outcome: Progressing Goal: Ability to tolerate increased activity will improve Outcome: Progressing   Problem: Clinical Measurements: Goal: Postoperative complications will be avoided or minimized Outcome: Progressing   Problem: Pain Management: Goal: Pain level will decrease with appropriate interventions Outcome: Progressing

## 2020-01-20 NOTE — [place] FL2 (Addendum)
[place] MEDICAID FL2 LEVEL OF CARE SCREENING TOOL     IDENTIFICATION  Patient Name: Bonnie Gray Birthdate: Feb 17, 1941 Sex: female Admission Date (Current Location): 01/19/2020  [facility] and [place] Number:  Herbalist and Address:  [facility],  [place], [place]      Provider Number: [id number]  Attending Physician Name and Address:  Gaynelle Arabian, MD  Relative Name and Phone Number:  Patrina Levering Daughter [phone]  [phone]    Current Level of Care: Hospital Recommended Level of Care: [place] Prior Approval Number:    Date Approved/Denied:   PASRR Number:    [id number]  Discharge Plan: SNF    Current Diagnoses: Patient Active Problem List   Diagnosis Date Noted  . OA (osteoarthritis) of hip 01/19/2020  . Primary osteoarthritis of left hip 01/19/2020  . Wound dehiscence 08/01/2015  . OA (osteoarthritis) of knee 01/09/2015  . Dyspnea on exertion 11/17/2014  . Right bundle branch block 11/17/2014  . Hypertension 01/14/2012  . Osteoarthritis 01/14/2012  . Change in bowel habits 01/14/2012  . Diarrhea 01/14/2012    Orientation RESPIRATION BLADDER Height & Weight     Self, Time, Situation, Place  Normal Continent Weight: 205 lb 0.4 oz (93 kg) Height:  5' 3.5" (161.3 cm)  BEHAVIORAL SYMPTOMS/MOOD NEUROLOGICAL BOWEL NUTRITION STATUS      Continent Diet(Regular)  AMBULATORY STATUS COMMUNICATION OF NEEDS Skin   Extensive Assist Verbally Surgical wounds(Left Hip)                       Personal Care Assistance Level of Assistance  Bathing, Feeding, Dressing Bathing Assistance: Limited assistance Feeding assistance: Independent Dressing Assistance: Maximum assistance     Functional Limitations Info  Sight, Speech, Hearing Sight Info: Impaired(Wears Glasses) Hearing Info: Adequate Speech Info: Adequate    SPECIAL CARE FACTORS FREQUENCY  PT (By licensed PT), OT (By licensed OT)      PT Frequency: 5x/week OT Frequency: 5x/week            Contractures Contractures Info: Not present    Additional Factors Info  Code Status, Allergies Code Status Info: Fullcode Allergies Info: Exforge Amlodipine Besylate-valsartan, Oxycodone Hcl, Norvasc Amlodipine Besylat, Robaxin Methocarbamol, Caduet Amlodipine-atorvastatin, Celebrex Celecoxib, Flexeril Cyclobenzaprine           Current Medications (01/20/2020):  This is the current hospital active medication list Current Facility-Administered Medications  Medication Dose Route Frequency Provider Last Rate Last Admin  . 0.9 %  sodium chloride infusion   Intravenous Continuous Edmisten, Kristie L, PA   Stopped at 01/20/20 0544  . acetaminophen (TYLENOL) tablet 500 mg  500 mg Oral Q6H Edmisten, Kristie L, PA   500 mg at 01/20/20 1127  . aspirin EC tablet 325 mg  325 mg Oral BID Edmisten, Kristie L, PA   325 mg at 01/20/20 0816  . bisacodyl (DULCOLAX) suppository 10 mg  10 mg Rectal Daily PRN Edmisten, Kristie L, PA      . canagliflozin (INVOKANA) tablet 100 mg  100 mg Oral QAC breakfast Edmisten, Kristie L, PA   100 mg at 01/20/20 0825  . diazepam (VALIUM) tablet 1 mg  1 mg Oral Q8H PRN Edmisten, Kristie L, PA      . docusate sodium (COLACE) capsule 100 mg  100 mg Oral BID Edmisten, Kristie L, PA   100 mg at 01/20/20 0816  . fluticasone (FLONASE) 50 MCG/ACT nasal spray 1 spray  1 spray Each Nare Daily  PRN Edmisten, Kristie L, PA      . hydrochlorothiazide (MICROZIDE) capsule 12.5 mg  12.5 mg Oral Daily Edmisten, Kristie L, PA   12.5 mg at 01/20/20 0825  . HYDROcodone-acetaminophen (NORCO) 7.5-325 MG per tablet 1-2 tablet  1-2 tablet Oral Q4H PRN Edmisten, Kristie L, PA      . HYDROcodone-acetaminophen (NORCO/VICODIN) 5-325 MG per tablet 1-2 tablet  1-2 tablet Oral Q4H PRN Edmisten, Kristie L, PA   1 tablet at 01/20/20 0530  . loratadine (CLARITIN) tablet 10 mg  10 mg Oral Daily Edmisten, Kristie L, PA   10 mg at 01/20/20 0816   . magnesium citrate solution 1 Bottle  1 Bottle Oral Once PRN Edmisten, Kristie L, PA      . menthol-cetylpyridinium (CEPACOL) lozenge 3 mg  1 lozenge Oral PRN Edmisten, Kristie L, PA       Or  . phenol (CHLORASEPTIC) mouth spray 1 spray  1 spray Mouth/Throat PRN Edmisten, Kristie L, PA      . metoCLOPramide (REGLAN) tablet 5-10 mg  5-10 mg Oral Q8H PRN Edmisten, Kristie L, PA       Or  . metoCLOPramide (REGLAN) injection 5-10 mg  5-10 mg Intravenous Q8H PRN Edmisten, Kristie L, PA      . metoprolol tartrate (LOPRESSOR) tablet 50 mg  50 mg Oral Daily Edmisten, Kristie L, PA   50 mg at 01/20/20 0817  . morphine 4 MG/ML injection 0.52-1 mg  0.52-1 mg Intravenous Q2H PRN Edmisten, Kristie L, PA      . naphazoline-glycerin (CLEAR EYES REDNESS) ophth solution 1 drop  1 drop Both Eyes QID PRN Edmisten, Kristie L, PA      . ondansetron (ZOFRAN) tablet 4 mg  4 mg Oral Q6H PRN Edmisten, Kristie L, PA       Or  . ondansetron (ZOFRAN) injection 4 mg  4 mg Intravenous Q6H PRN Edmisten, Kristie L, PA      . polyethylene glycol (MIRALAX / GLYCOLAX) packet 17 g  17 g Oral Daily PRN Edmisten, Kristie L, PA      . polyvinyl alcohol (LIQUIFILM TEARS) 1.4 % ophthalmic solution 1 drop  1 drop Both Eyes PRN Aluisio, Pilar Plate, MD      . traMADol Veatrice Bourbon) tablet 50-100 mg  50-100 mg Oral Q6H PRN Edmisten, Ok Anis, PA         Discharge Medications: Please see discharge summary for a list of discharge medications.  Relevant Imaging Results:  Relevant Lab Results:   Additional Information ssn: [id number]  Lia Hopping, LCSW

## 2020-01-20 NOTE — TOC Initial Note (Signed)
Transition of Care [facility]) - Initial/Assessment Note    Patient Details  Name: Bonnie Gray MRN: [id number] Date of Birth: 10/19/1940  [facility] CM/SW Contact:    Lia Hopping, [place] Phone Number: 01/20/2020, 11:57 AM  Clinical Narrative:  Patient admitted for a left total hip arthroplasty.               CSW met with the patient and her daughter at bedside to discuss discharge plan to SNF for rehab. Patient and daughter agreeable to SNF plan. CSW explain SNF process and will follow up with SNF bed offers later today. Patient daughter reports they prefer the patient go to a SNF close to her home [place], [place]. Patient reports she is looking forward to therapy and sitting outside on the front porch of her home. She reports it has been difficult over the past few months to walk without being in pain. Daughter reports the patient has been primarily using a wheelchair.  FL2 completed.    Expected Discharge Plan: Skilled Nursing Facility Barriers to Discharge: Continued Medical Work up, Ship broker   Patient Goals and CMS Choice Patient states their goals for this hospitalization and ongoing recovery are:: "I just want to be able to walk again and sit on the porch" CMS Medicare.gov Compare Post Acute Care list provided to:: Patient Choice offered to / list presented to : Patient, Adult Children  Expected Discharge Plan and Services Expected Discharge Plan: [place] In-house Referral: NA Discharge Planning Services: CM Consult Post Acute Care Choice: [place] Living arrangements for the past 2 months: Single Family Home                                      Prior Living Arrangements/Services Living arrangements for the past 2 months: Single Family Home Lives with:: Self Patient language and need for interpreter reviewed:: No Do you feel safe going back to the place where you live?: Yes      Need for Family  Participation in Patient Care: Yes (Comment) Care giver support system in place?: Yes (comment) Current home services: DME Criminal Activity/Legal Involvement Pertinent to Current Situation/Hospitalization: No - Comment as needed  Activities of Daily Living Home Assistive Devices/Equipment: Cane (specify quad or straight), Wheelchair, Environmental consultant (specify type), Shower chair without back ADL Screening (condition at time of admission) Patient's cognitive ability adequate to safely complete daily activities?: Yes Is the patient deaf or have difficulty hearing?: No Does the patient have difficulty seeing, even when wearing glasses/contacts?: Yes Does the patient have difficulty concentrating, remembering, or making decisions?: No Patient able to express need for assistance with ADLs?: Yes Does the patient have difficulty dressing or bathing?: No Independently performs ADLs?: Yes (appropriate for developmental age) Does the patient have difficulty walking or climbing stairs?: Yes Weakness of Legs: Both Weakness of Arms/Hands: Both  Permission Sought/Granted Permission sought to share information with : Case Manager, Customer service manager, Family Supports Permission granted to share information with : Yes, Verbal Permission Granted  Share Information with NAME: Shelton,Michele  Permission granted to share info w AGENCY: [facility] granted to share info w Relationship: Daughter  Permission granted to share info w Contact Information: [phone]  [phone]  Emotional Assessment Appearance:: Appears stated age Attitude/Demeanor/Rapport: Engaged Affect (typically observed): Accepting, Pleasant Orientation: : Oriented to Self, Oriented to Place, Oriented to  Time, Oriented  to Situation Alcohol / Substance Use: Not Applicable Psych Involvement: No (comment)  Admission diagnosis:  Primary osteoarthritis of left hip [M16.12] Patient Active Problem List    Diagnosis Date Noted  . OA (osteoarthritis) of hip 01/19/2020  . Primary osteoarthritis of left hip 01/19/2020  . Wound dehiscence 08/01/2015  . OA (osteoarthritis) of knee 01/09/2015  . Dyspnea on exertion 11/17/2014  . Right bundle branch block 11/17/2014  . Hypertension 01/14/2012  . Osteoarthritis 01/14/2012  . Change in bowel habits 01/14/2012  . Diarrhea 01/14/2012   PCP:  Ranae Palms, MD Pharmacy:   [facility] [address], [place] [address] Phone: [phone] Fax: [phone]     Social Determinants of Health (SDOH) Interventions    Readmission Risk Interventions No flowsheet data found.

## 2020-01-20 NOTE — Progress Notes (Signed)
Physical Therapy Treatment Patient Details Name: Bonnie Gray MRN: [id number] DOB: 31-Oct-1940 Today's Date: 01/20/2020    History of Present Illness Patient is 79 y.o. female s/p Lt THA anterior approach on 01/19/20 with PMH significant for OA, Rt BBB, HTN, GERD, depression, anxiety, chroinic pain, Bil TKA's in 2016.    PT Comments    POD # 1 am session Assisted OOB to amb.  General bed mobility comments: assist with B LE and upper body with increased assist to complete scooting to EOB.  General transfer comment: 25% VC's on proper hand placement and increased time/effort to rise.  General Gait Details: Pt required + 2 assist for safety and increased time.  Great difficulty self advancing L LE, therapist assisted 75%.  Limited distance due to pain, fatigue and effort. Performed a few TE's followed by ICE.   Follow Up Recommendations  SNF     Equipment Recommendations  None recommended by PT    Recommendations for Other Services       Precautions / Restrictions Precautions Precautions: Fall Restrictions Weight Bearing Restrictions: No Other Position/Activity Restrictions: WBAT    Mobility  Bed Mobility Overal bed mobility: Needs Assistance Bed Mobility: Supine to Sit     Supine to sit: Mod assist;HOB elevated;Max assist     General bed mobility comments: assist with B LE and upper body with increased assist to complete scooting to EOB  Transfers Overall transfer level: Needs assistance Equipment used: Rolling walker (2 wheeled) Transfers: Sit to/from Stand Sit to Stand: Mod assist         General transfer comment: 25% VC's on proper hand placement and increased time/effort to rise  Ambulation/Gait Ambulation/Gait assistance: Mod assist;+2 safety/equipment Gait Distance (Feet): 6 Feet Assistive device: Rolling walker (2 wheeled) Gait Pattern/deviations: Step-to pattern;Decreased stride length;Decreased step length - left;Decreased step length -  right;Decreased weight shift to left;Shuffle;Trunk flexed Gait velocity: decreased x 2   General Gait Details: Pt required + 2 assist for safety and increased time.  Great difficulty self advancing L LE, therapist assisted 75%.  Limited distance due to pain, fatigue and effort.   Stairs             Wheelchair Mobility    Modified Rankin (Stroke Patients Only)       Balance Overall balance assessment: Needs assistance;History of Falls Sitting-balance support: Feet supported Sitting balance-Leahy Scale: Fair     Standing balance support: During functional activity;Bilateral upper extremity supported Standing balance-Leahy Scale: Poor Standing balance comment: heavily reliant on UE support and external support                            Cognition Arousal/Alertness: Awake/alert Behavior During Therapy: WFL for tasks assessed/performed Overall Cognitive Status: Within Functional Limits for tasks assessed                                        Exercises      General Comments        Pertinent Vitals/Pain Pain Assessment: 0-10 Pain Score: 5  Faces Pain Scale: Hurts whole lot Pain Location: L hip Pain Descriptors / Indicators: Grimacing;Aching;Operative site guarding Pain Intervention(s): Premedicated before session;Monitored during session;Repositioned;Ice applied    Home Living Family/patient expects to be discharged to:: Private residence Living Arrangements: Alone Available Help at Discharge: Family;Available PRN/intermittently Type of Home: House Home Access: Stairs  to enter Entrance Stairs-Rails: None Home Layout: One level;Laundry or work area in [place]: Wheelchair - [employer] - 2 wheels;Bedside commode;Shower seat;Walker - 4 wheels;Cane - quad;Other (comment)(lift chair) Additional Comments: pt lives alone and has 3 indoor dogs and 2 outdoor cats at home. She reports sicne February she has been very limited  with mobility and is using a RW to walk short distances in home to get from bed to bathroom. Pt reports she has fallen 3x in the last 2 months, 1x in bathroom cleaning, 1x sliding off the couch, and 1x she couldn't get up from teh kitchen chair and had to call her son-in-law to help her.    Prior Function Level of Independence: Needs assistance;Independent with assistive device(s)  Gait / Transfers Assistance Needed: pt has been ambulating short household distances with RW. she states she was sliding her feet and has not been abel to really lift them up  for the last 2 months. She has chairs all around her home to be able to sit in and rest. She also is using a lift chair to sleep in and has to raise it up to stand and get to walker. For long distances out of home pt has a wheelchair that her daughter keeps in the car to get to doctors appointments. ADL's / Homemaking Assistance Needed: pt has been taking sink baths in bathroom because she cannot get into shower. pt's duaghter comes to help ~2 hours in the morning and evening to help with the pets.      PT Goals (current goals can now be found in the care plan section) Acute Rehab PT Goals Patient Stated Goal: to get back to walking Progress towards PT goals: Progressing toward goals    Frequency    7X/week      PT Plan Current plan remains appropriate    Co-evaluation              AM-PAC PT "6 Clicks" Mobility   Outcome Measure  Help needed turning from your back to your side while in a flat bed without using bedrails?: A Lot Help needed moving from lying on your back to sitting on the side of a flat bed without using bedrails?: A Lot Help needed moving to and from a bed to a chair (including a wheelchair)?: A Lot Help needed standing up from a chair using your arms (e.g., wheelchair or bedside chair)?: A Lot Help needed to walk in hospital room?: A Lot Help needed climbing 3-5 steps with a railing? : Total 6 Click Score:  11    End of Session Equipment Utilized During Treatment: Gait belt Activity Tolerance: Patient limited by fatigue;Patient limited by pain Patient left: in chair;with call bell/phone within reach;with family/visitor present Nurse Communication: Mobility status PT Visit Diagnosis: Muscle weakness (generalized) (M62.81);History of falling (Z91.81);Difficulty in walking, not elsewhere classified (R26.2)     Time: 1135-1200 PT Time Calculation (min) (ACUTE ONLY): 25 min  Charges:  $Gait Training: 8-22 mins $Therapeutic Exercise: 8-22 mins                     Rica Koyanagi  PTA Acute  Rehabilitation Services Pager      [phone] Office      [phone]

## 2020-01-20 NOTE — Progress Notes (Signed)
   Subjective: 1 Day Post-Op Procedure(s) (LRB): TOTAL HIP ARTHROPLASTY ANTERIOR APPROACH (Left) Patient reports pain as mild.   Patient seen in rounds with Dr. Wynelle Link. Patient is well, and has had no acute complaints or problems other than mild discomfort in the left hip. No acute events overnight. Voiding without difficulty, positive flatus.  We will continue therapy today.   Objective: Vital signs in last 24 hours: Temp:  [97.7 F (36.5 C)-98.9 F (37.2 C)] 98 F (36.7 C) (04/15 0617) Pulse Rate:  [51-79] 69 (04/15 0617) Resp:  [13-21] 16 (04/15 0617) BP: (148-205)/(60-93) 174/70 (04/15 0617) SpO2:  [95 %-100 %] 95 % (04/15 0617) Weight:  [93 kg] 93 kg (04/14 1113)  Intake/Output from previous day:  Intake/Output Summary (Last 24 hours) at 01/20/2020 0733 Last data filed at 01/20/2020 0617 Gross per 24 hour  Intake 4200.48 ml  Output 5640 ml  Net -1439.52 ml     Intake/Output this shift: No intake/output data recorded.  Labs: Recent Labs    01/20/20 0337  HGB 10.1*   Recent Labs    01/20/20 0337  WBC 15.2*  RBC 3.46*  HCT 30.7*  PLT 237   Recent Labs    01/20/20 0337  NA 134*  K 3.7  CL 100  CO2 24  BUN 8  CREATININE 0.53  GLUCOSE 141*  CALCIUM 8.4*   No results for input(s): LABPT, INR in the last 72 hours.  Exam: General - Patient is Alert and Oriented Extremity - Neurologically intact Sensation intact distally Intact pulses distally Dorsiflexion/Plantar flexion intact Dressing - dressing C/D/I Motor Function - intact, moving foot and toes well on exam.   Past Medical History:  Diagnosis Date  . Anxiety   . Cataract    right  . Chronic pain   . Depression   . Diarrhea   . Diverticulitis   . GERD (gastroesophageal reflux disease)   . Headache    migraines  . Hemorrhoids   . History of blood transfusion   . Hypertension   . Macular degeneration    right   . Osteoarthritis    knees; degenerative disc disease  . Pre-diabetes     . Right bundle branch block   . Wears dentures     Assessment/Plan: 1 Day Post-Op Procedure(s) (LRB): TOTAL HIP ARTHROPLASTY ANTERIOR APPROACH (Left) Principal Problem:   OA (osteoarthritis) of hip Active Problems:   Primary osteoarthritis of left hip  Estimated body mass index is 35.75 kg/m as calculated from the following:   Height as of this encounter: 5' 3.5" (1.613 m).   Weight as of this encounter: 93 kg. Advance diet Up with therapy  DVT Prophylaxis - Aspirin Weight bearing as tolerated. D/C O2 and pulse ox and try on room air. Hemovac pulled without difficulty, will begin therapy.  Plan is to go Skilled nursing facility after hospital stay. Hemoglobin stable at 10.1 this morning. We discussed plans to discharge to SNF after hospital stay today, which she agrees with. We will begin making arrangements for this today. Continue working with therapy. She will leave the aquacel dressing in place until follow up. Follow up in the office in 2 weeks.   Griffith Citron, PA-C Orthopedic Surgery [phone] 01/20/2020, 7:33 AM

## 2020-01-20 NOTE — Progress Notes (Signed)
Physical Therapy Treatment Patient Details Name: Bonnie Gray MRN: [id number] DOB: 15-Mar-1941 Today's Date: 01/20/2020    History of Present Illness Patient is 79 y.o. female s/p Lt THA anterior approach on 01/19/20 with PMH significant for OA, Rt BBB, HTN, GERD, depression, anxiety, chroinic pain, Bil TKA's in 2016.    PT Comments    POD # 1 pm session Assisted from recliner to [facility] then amb a short distance to bed.  Assisted back to bed and positioned sidelying to comfort.   Follow Up Recommendations  SNF     Equipment Recommendations  None recommended by PT    Recommendations for Other Services       Precautions / Restrictions Precautions Precautions: Fall Restrictions Weight Bearing Restrictions: No Other Position/Activity Restrictions: WBAT    Mobility  Bed Mobility Overal bed mobility: Needs Assistance Bed Mobility: Sit to Supine     Supine to sit: Mod assist;HOB elevated;Max assist Sit to supine: Mod assist;+2 for physical assistance;HOB elevated;Max assist   General bed mobility comments: assisted back to bed with increased assist needed  Transfers Overall transfer level: Needs assistance Equipment used: Rolling walker (2 wheeled) Transfers: Sit to/from Stand Sit to Stand: Mod assist         General transfer comment: 25% VC's on proper hand placement and increased time/effort to rise.   Also assisted with a BSC transfer and peri care as pt was unable to self perform and maintain a safe standing balance.  Ambulation/Gait Ambulation/Gait assistance: Mod assist;+2 safety/equipment Gait Distance (Feet): 5 Feet Assistive device: Rolling walker (2 wheeled) Gait Pattern/deviations: Step-to pattern;Decreased stride length;Decreased step length - left;Decreased step length - right;Decreased weight shift to left;Shuffle;Trunk flexed Gait velocity: decreased x 2   General Gait Details: Pt required + 2 assist for safety and increased time.  Great difficulty  self advancing L LE, therapist assisted 50%.  Amb from [facility] accross the room to bed.   Stairs             Wheelchair Mobility    Modified Rankin (Stroke Patients Only)       Balance Overall balance assessment: Needs assistance;History of Falls Sitting-balance support: Feet supported Sitting balance-Leahy Scale: Fair     Standing balance support: During functional activity;Bilateral upper extremity supported Standing balance-Leahy Scale: Poor Standing balance comment: heavily reliant on UE support and external support                            Cognition Arousal/Alertness: Awake/alert Behavior During Therapy: WFL for tasks assessed/performed Overall Cognitive Status: Within Functional Limits for tasks assessed                                        Exercises      General Comments        Pertinent Vitals/Pain Pain Assessment: 0-10 Pain Score: 5  Faces Pain Scale: Hurts whole lot Pain Location: L hip Pain Descriptors / Indicators: Grimacing;Aching;Operative site guarding Pain Intervention(s): Premedicated before session;Monitored during session;Repositioned;Ice applied    Home Living Family/patient expects to be discharged to:: Private residence Living Arrangements: Alone Available Help at Discharge: Family;Available PRN/intermittently Type of Home: House Home Access: Stairs to enter Entrance Stairs-Rails: None Home Layout: One level;Laundry or work area in [place]: Wheelchair - [employer] - 2 wheels;Bedside commode;Shower seat;Walker - 4 wheels;Cane - quad;Other (comment)(lift chair) Additional  Comments: pt lives alone and has 3 indoor dogs and 2 outdoor cats at home. She reports sicne February she has been very limited with mobility and is using a RW to walk short distances in home to get from bed to bathroom. Pt reports she has fallen 3x in the last 2 months, 1x in bathroom cleaning, 1x sliding off the couch, and  1x she couldn't get up from teh kitchen chair and had to call her son-in-law to help her.    Prior Function Level of Independence: Needs assistance;Independent with assistive device(s)  Gait / Transfers Assistance Needed: pt has been ambulating short household distances with RW. she states she was sliding her feet and has not been abel to really lift them up  for the last 2 months. She has chairs all around her home to be able to sit in and rest. She also is using a lift chair to sleep in and has to raise it up to stand and get to walker. For long distances out of home pt has a wheelchair that her daughter keeps in the car to get to doctors appointments. ADL's / Homemaking Assistance Needed: pt has been taking sink baths in bathroom because she cannot get into shower. pt's duaghter comes to help ~2 hours in the morning and evening to help with the pets.      PT Goals (current goals can now be found in the care plan section) Acute Rehab PT Goals Patient Stated Goal: to get back to walking Progress towards PT goals: Progressing toward goals    Frequency    7X/week      PT Plan Current plan remains appropriate    Co-evaluation              AM-PAC PT "6 Clicks" Mobility   Outcome Measure  Help needed turning from your back to your side while in a flat bed without using bedrails?: A Lot Help needed moving from lying on your back to sitting on the side of a flat bed without using bedrails?: A Lot Help needed moving to and from a bed to a chair (including a wheelchair)?: A Lot Help needed standing up from a chair using your arms (e.g., wheelchair or bedside chair)?: A Lot Help needed to walk in hospital room?: A Lot Help needed climbing 3-5 steps with a railing? : Total 6 Click Score: 11    End of Session Equipment Utilized During Treatment: Gait belt Activity Tolerance: Patient limited by fatigue;Patient limited by pain Patient left: in bed Nurse Communication: Mobility  status PT Visit Diagnosis: Muscle weakness (generalized) (M62.81);History of falling (Z91.81);Difficulty in walking, not elsewhere classified (R26.2)     Time: [id number] PT Time Calculation (min) (ACUTE ONLY): 23 min  Charges:  $Gait Training: 8-22 mins $Therapeutic Activity: 8-22 mins                     Rica Koyanagi  PTA Acute  Rehabilitation Services Pager      [phone] Office      [phone]

## 2020-01-21 DIAGNOSIS — R41841 Cognitive communication deficit: Secondary | ICD-10-CM | POA: Diagnosis not present

## 2020-01-21 DIAGNOSIS — M6281 Muscle weakness (generalized): Secondary | ICD-10-CM | POA: Diagnosis not present

## 2020-01-21 DIAGNOSIS — R0902 Hypoxemia: Secondary | ICD-10-CM | POA: Diagnosis not present

## 2020-01-21 DIAGNOSIS — F411 Generalized anxiety disorder: Secondary | ICD-10-CM | POA: Diagnosis not present

## 2020-01-21 DIAGNOSIS — M160 Bilateral primary osteoarthritis of hip: Secondary | ICD-10-CM | POA: Diagnosis not present

## 2020-01-21 DIAGNOSIS — G8929 Other chronic pain: Secondary | ICD-10-CM | POA: Diagnosis not present

## 2020-01-21 DIAGNOSIS — R2689 Other abnormalities of gait and mobility: Secondary | ICD-10-CM | POA: Diagnosis not present

## 2020-01-21 DIAGNOSIS — I10 Essential (primary) hypertension: Secondary | ICD-10-CM | POA: Diagnosis not present

## 2020-01-21 DIAGNOSIS — E119 Type 2 diabetes mellitus without complications: Secondary | ICD-10-CM | POA: Diagnosis not present

## 2020-01-21 DIAGNOSIS — I4510 Unspecified right bundle-branch block: Secondary | ICD-10-CM | POA: Diagnosis not present

## 2020-01-21 DIAGNOSIS — Z7401 Bed confinement status: Secondary | ICD-10-CM | POA: Diagnosis not present

## 2020-01-21 DIAGNOSIS — Z96642 Presence of left artificial hip joint: Secondary | ICD-10-CM | POA: Diagnosis not present

## 2020-01-21 DIAGNOSIS — F330 Major depressive disorder, recurrent, mild: Secondary | ICD-10-CM | POA: Diagnosis not present

## 2020-01-21 DIAGNOSIS — Z471 Aftercare following joint replacement surgery: Secondary | ICD-10-CM | POA: Diagnosis not present

## 2020-01-21 DIAGNOSIS — R52 Pain, unspecified: Secondary | ICD-10-CM | POA: Diagnosis not present

## 2020-01-21 DIAGNOSIS — M2550 Pain in unspecified joint: Secondary | ICD-10-CM | POA: Diagnosis not present

## 2020-01-21 LAB — CBC
HCT: 31.9 % — ABNORMAL LOW (ref 36.0–46.0)
Hemoglobin: 10.2 g/dL — ABNORMAL LOW (ref 12.0–15.0)
MCH: 28.7 pg (ref 26.0–34.0)
MCHC: 32 g/dL (ref 30.0–36.0)
MCV: 89.9 fL (ref 80.0–100.0)
Platelets: 273 10*3/uL (ref 150–400)
RBC: 3.55 MIL/uL — ABNORMAL LOW (ref 3.87–5.11)
RDW: 13.3 % (ref 11.5–15.5)
WBC: 14.3 10*3/uL — ABNORMAL HIGH (ref 4.0–10.5)
nRBC: 0 % (ref 0.0–0.2)

## 2020-01-21 LAB — BASIC METABOLIC PANEL
Anion gap: 9 (ref 5–15)
BUN: 11 mg/dL (ref 8–23)
CO2: 27 mmol/L (ref 22–32)
Calcium: 8.7 mg/dL — ABNORMAL LOW (ref 8.9–10.3)
Chloride: 99 mmol/L (ref 98–111)
Creatinine, Ser: 0.53 mg/dL (ref 0.44–1.00)
GFR calc Af Amer: >60 mL/min (ref >60)
GFR calc non Af Amer: >60 mL/min (ref >60)
Glucose, Bld: 129 mg/dL — ABNORMAL HIGH (ref 70–99)
Potassium: 3.4 mmol/L — ABNORMAL LOW (ref 3.5–5.1)
Sodium: 135 mmol/L (ref 135–145)

## 2020-01-21 LAB — SARS CORONAVIRUS 2 (TAT 6-24 HRS): SARS Coronavirus 2: NEGATIVE

## 2020-01-21 MED ORDER — POTASSIUM CHLORIDE CRYS ER 20 MEQ PO TBCR
40.0000 meq | EXTENDED_RELEASE_TABLET | Freq: Once | ORAL | Status: AC
  Administered 2020-01-21: 09:00:00 40 meq via ORAL
  Filled 2020-01-21: qty 2

## 2020-01-21 NOTE — Progress Notes (Signed)
Physical Therapy Treatment Patient Details Name: Bonnie Gray MRN: [id number] DOB: 08-03-1941 Today's Date: 01/21/2020    History of Present Illness Patient is 79 y.o. female s/p Lt THA anterior approach on 01/19/20 with PMH significant for OA, Rt BBB, HTN, GERD, depression, anxiety, chroinic pain, Bil TKA's in 2016.    PT Comments    POD # 2 Pt OOB in recliner.  General transfer comment: required increased assist this morning.  Greatest difficulty rising from lower chair level. General Gait Details: Pt required + 2 assist for safety and increased time.  Great difficulty self advancing L LE, therapist assisted 75%.  Recliner brought to pt as too fatigued to continue past 1 foot. Positioned in recliner and performed some TE's mostly AAROM. Recommending SNF level therapy for follow up as pt has been primarily sedentary for last 2 months, has had multiple falls, and lives alone with limited assistance.   Follow Up Recommendations  SNF     Equipment Recommendations  None recommended by PT    Recommendations for Other Services       Precautions / Restrictions Precautions Precautions: Fall Restrictions Weight Bearing Restrictions: No Other Position/Activity Restrictions: WBAT    Mobility  Bed Mobility               General bed mobility comments: Pt OOB in recliner  Transfers Overall transfer level: Needs assistance Equipment used: Rolling walker (2 wheeled) Transfers: Sit to/from Stand Sit to Stand: Mod assist;Max assist         General transfer comment: required increased assist this morning.  Greatest difficulty rising from lower chair level.  Ambulation/Gait Ambulation/Gait assistance: Mod assist;+2 safety/equipment Gait Distance (Feet): 1 Feet Assistive device: Rolling walker (2 wheeled) Gait Pattern/deviations: Step-to pattern;Decreased stride length;Decreased step length - left;Decreased step length - right;Decreased weight shift to left;Shuffle;Trunk  flexed     General Gait Details: Pt required + 2 assist for safety and increased time.  Great difficulty self advancing L LE, therapist assisted 75%.  Recliner brought to pt as too fatigued to continue past 1 foot.   Stairs             Wheelchair Mobility    Modified Rankin (Stroke Patients Only)       Balance                                            Cognition Arousal/Alertness: Awake/alert Behavior During Therapy: WFL for tasks assessed/performed Overall Cognitive Status: Within Functional Limits for tasks assessed                                 General Comments: pleasant      Exercises  10 reps AP, knee presses, HS., ABd and LAQ's    General Comments        Pertinent Vitals/Pain Pain Assessment: 0-10 Pain Score: 5  Pain Location: L hip Pain Descriptors / Indicators: Grimacing;Aching;Operative site guarding Pain Intervention(s): Monitored during session;Premedicated before session;Repositioned;Ice applied    Home Living                      Prior Function            PT Goals (current goals can now be found in the care plan section) Progress towards PT goals: Progressing toward goals  Frequency    7X/week      PT Plan Current plan remains appropriate    Co-evaluation              AM-PAC PT "6 Clicks" Mobility   Outcome Measure  Help needed turning from your back to your side while in a flat bed without using bedrails?: A Lot Help needed moving from lying on your back to sitting on the side of a flat bed without using bedrails?: A Lot Help needed moving to and from a bed to a chair (including a wheelchair)?: A Lot Help needed standing up from a chair using your arms (e.g., wheelchair or bedside chair)?: A Lot Help needed to walk in hospital room?: A Lot Help needed climbing 3-5 steps with a railing? : Total 6 Click Score: 11    End of Session Equipment Utilized During Treatment: Gait  belt Activity Tolerance: Patient limited by fatigue;Patient limited by pain Patient left: in chair;with chair alarm set;with call bell/phone within reach Nurse Communication: Mobility status PT Visit Diagnosis: Muscle weakness (generalized) (M62.81);History of falling (Z91.81);Difficulty in walking, not elsewhere classified (R26.2)     Time: [id number] PT Time Calculation (min) (ACUTE ONLY): 25 min  Charges:  $Gait Training: 8-22 mins $Therapeutic Exercise: 8-22 mins                     Rica Koyanagi  PTA Acute  Rehabilitation Services Pager      [phone] Office      [phone]

## 2020-01-21 NOTE — Progress Notes (Signed)
   Subjective: 2 Days Post-Op Procedure(s) (LRB): TOTAL HIP ARTHROPLASTY ANTERIOR APPROACH (Left) Patient reports pain as mild.   Patient seen in rounds for Dr. Wynelle Link. Patient is well, and has had no acute complaints or problems other than discomfort in the left hip. No acute events overnight. She states she slept very well and had three good meals yesterday. She denies CP, SHOB, N/V. She is voiding without difficulty, positive flatus.  We will continue therapy today.   Objective: Vital signs in last 24 hours: Temp:  [99 F (37.2 C)-99.2 F (37.3 C)] 99 F (37.2 C) (04/16 0518) Pulse Rate:  [67-96] 96 (04/16 0518) Resp:  [16-20] 20 (04/16 0518) BP: (138-181)/(51-73) 181/73 (04/16 0518) SpO2:  [94 %-97 %] 95 % (04/16 0518)  Intake/Output from previous day:  Intake/Output Summary (Last 24 hours) at 01/21/2020 0739 Last data filed at 01/20/2020 1800 Gross per 24 hour  Intake 360 ml  Output 200 ml  Net 160 ml     Intake/Output this shift: No intake/output data recorded.  Labs: Recent Labs    01/20/20 0337 01/21/20 0337  HGB 10.1* 10.2*   Recent Labs    01/20/20 0337 01/21/20 0337  WBC 15.2* 14.3*  RBC 3.46* 3.55*  HCT 30.7* 31.9*  PLT 237 273   Recent Labs    01/20/20 0337 01/21/20 0337  NA 134* 135  K 3.7 3.4*  CL 100 99  CO2 24 27  BUN 8 11  CREATININE 0.53 0.53  GLUCOSE 141* 129*  CALCIUM 8.4* 8.7*   No results for input(s): LABPT, INR in the last 72 hours.  Exam: General - Patient is Alert and Oriented Extremity - Neurologically intact Sensation intact distally Intact pulses distally Dorsiflexion/Plantar flexion intact Dressing - Aquacel had moderate bloody drainage distally, where the hemovac drain was pulled. Applied new dressing today. Motor Function - intact, moving foot and toes well on exam.   Past Medical History:  Diagnosis Date  . Anxiety   . Cataract    right  . Chronic pain   . Depression   . Diarrhea   . Diverticulitis   .  GERD (gastroesophageal reflux disease)   . Headache    migraines  . Hemorrhoids   . History of blood transfusion   . Hypertension   . Macular degeneration    right   . Osteoarthritis    knees; degenerative disc disease  . Pre-diabetes   . Right bundle branch block   . Wears dentures     Assessment/Plan: 2 Days Post-Op Procedure(s) (LRB): TOTAL HIP ARTHROPLASTY ANTERIOR APPROACH (Left) Principal Problem:   OA (osteoarthritis) of hip Active Problems:   Primary osteoarthritis of left hip  Estimated body mass index is 35.75 kg/m as calculated from the following:   Height as of this encounter: 5' 3.5" (1.613 m).   Weight as of this encounter: 93 kg. Advance diet Up with therapy  DVT Prophylaxis - Aspirin Weight bearing as tolerated.  Potassium of 3.4 today, we will give one dose of potassium chloride 40 mEq today. She has been accepted to [facility], and needs a COVID test prior to transfer. We will order this today, in hopes of discharge today. Aquacel to remain in place until follow up. Follow up in the office 2 weeks after surgery.   Griffith Citron, PA-C Orthopedic Surgery [phone] 01/21/2020, 7:39 AM

## 2020-01-21 NOTE — Discharge Summary (Signed)
Physician Discharge Summary   Patient ID: Bonnie Gray MRN: [id number] DOB/AGE: 07-01-1941 79 y.o.  Admit date: 01/19/2020 Discharge date:   Primary Diagnosis: Osteoarthritis of the Left  hip.    Admission Diagnoses:  Past Medical History:  Diagnosis Date  . Anxiety   . Cataract    right  . Chronic pain   . Depression   . Diarrhea   . Diverticulitis   . GERD (gastroesophageal reflux disease)   . Headache    migraines  . Hemorrhoids   . History of blood transfusion   . Hypertension   . Macular degeneration    right   . Osteoarthritis    knees; degenerative disc disease  . Pre-diabetes   . Right bundle branch block   . Wears dentures    Discharge Diagnoses:   Principal Problem:   OA (osteoarthritis) of hip Active Problems:   Primary osteoarthritis of left hip  Estimated body mass index is 35.75 kg/m as calculated from the following:   Height as of this encounter: 5' 3.5" (1.613 m).   Weight as of this encounter: 93 kg.  Procedure:  Procedure(s) (LRB): TOTAL HIP ARTHROPLASTY ANTERIOR APPROACH (Left)   Consults: None  HPI: Bonnie Gray is a 79 y.o. female who has advanced end-  stage arthritis of their Left  hip with progressively worsening pain and  dysfunction.The patient has failed nonoperative management and presents for  total hip arthroplasty.    Laboratory Data: Admission on 01/19/2020  Component Date Value Ref Range Status  . Glucose-Capillary 01/19/2020 112* 70 - 99 mg/dL Final   Glucose reference range applies only to samples taken after fasting for at least 8 hours.  . WBC 01/20/2020 15.2* 4.0 - 10.5 K/uL Final  . RBC 01/20/2020 3.46* 3.87 - 5.11 MIL/uL Final  . Hemoglobin 01/20/2020 10.1* 12.0 - 15.0 g/dL Final  . HCT 01/20/2020 30.7* 36.0 - 46.0 % Final  . MCV 01/20/2020 88.7  80.0 - 100.0 fL Final  . MCH 01/20/2020 29.2  26.0 - 34.0 pg Final  . MCHC 01/20/2020 32.9  30.0 - 36.0 g/dL Final  . RDW 01/20/2020 13.1  11.5 - 15.5  % Final  . Platelets 01/20/2020 237  150 - 400 K/uL Final  . nRBC 01/20/2020 0.0  0.0 - 0.2 % Final   Performed at [facility], [place] [address], [place], [place] [address]  . Sodium 01/20/2020 134* 135 - 145 mmol/L Final  . Potassium 01/20/2020 3.7  3.5 - 5.1 mmol/L Final  . Chloride 01/20/2020 100  98 - 111 mmol/L Final  . CO2 01/20/2020 24  22 - 32 mmol/L Final  . Glucose, Bld 01/20/2020 141* 70 - 99 mg/dL Final   Glucose reference range applies only to samples taken after fasting for at least 8 hours.  . BUN 01/20/2020 8  8 - 23 mg/dL Final  . Creatinine, Ser 01/20/2020 0.53  0.44 - 1.00 mg/dL Final  . Calcium 01/20/2020 8.4* 8.9 - 10.3 mg/dL Final  . GFR calc non Af Amer 01/20/2020 >60  >60 mL/min Final  . GFR calc Af Amer 01/20/2020 >60  >60 mL/min Final  . Anion gap 01/20/2020 10  5 - 15 Final   Performed at [facility], [place] [address], [place], [place] [address]  . WBC 01/21/2020 14.3* 4.0 - 10.5 K/uL Final  . RBC 01/21/2020 3.55* 3.87 - 5.11 MIL/uL Final  . Hemoglobin 01/21/2020 10.2* 12.0 - 15.0 g/dL Final  . HCT  01/21/2020 31.9* 36.0 - 46.0 % Final  . MCV 01/21/2020 89.9  80.0 - 100.0 fL Final  . MCH 01/21/2020 28.7  26.0 - 34.0 pg Final  . MCHC 01/21/2020 32.0  30.0 - 36.0 g/dL Final  . RDW 01/21/2020 13.3  11.5 - 15.5 % Final  . Platelets 01/21/2020 273  150 - 400 K/uL Final  . nRBC 01/21/2020 0.0  0.0 - 0.2 % Final   Performed at [facility], [place] [address] [place], [place] [address]  . Sodium 01/21/2020 135  135 - 145 mmol/L Final  . Potassium 01/21/2020 3.4* 3.5 - 5.1 mmol/L Final  . Chloride 01/21/2020 99  98 - 111 mmol/L Final  . CO2 01/21/2020 27  22 - 32 mmol/L Final  . Glucose, Bld 01/21/2020 129* 70 - 99 mg/dL Final   Glucose reference range applies only to samples taken after fasting for at least 8 hours.  . BUN 01/21/2020 11  8 - 23 mg/dL Final  . Creatinine, Ser 01/21/2020 0.53  0.44 - 1.00 mg/dL  Final  . Calcium 01/21/2020 8.7* 8.9 - 10.3 mg/dL Final  . GFR calc non Af Amer 01/21/2020 >60  >60 mL/min Final  . GFR calc Af Amer 01/21/2020 >60  >60 mL/min Final  . Anion gap 01/21/2020 9  5 - 15 Final   Performed at [facility], [place] [address] [place], [place] [address]  Hospital Outpatient Visit on 01/15/2020  Component Date Value Ref Range Status  . SARS Coronavirus 2 01/15/2020 NEGATIVE  NEGATIVE Final   Comment: (NOTE) SARS-CoV-2 target nucleic acids are NOT DETECTED. The SARS-CoV-2 RNA is generally detectable in upper and lower respiratory specimens during the acute phase of infection. Negative results do not preclude SARS-CoV-2 infection, do not rule out co-infections with other pathogens, and should not be used as the sole basis for treatment or other patient management decisions. Negative results must be combined with clinical observations, patient history, and epidemiological information. The expected result is Negative. Fact Sheet for Patients: SugarRoll.be Fact Sheet for Healthcare Providers: https://www.woods-mathews.com/ This test is not yet approved or cleared by the [place] FDA and  has been authorized for detection and/or diagnosis of SARS-CoV-2 by FDA under an Emergency Use Authorization (EUA). This EUA will remain  in effect (meaning this test can be used) for the duration of the COVID-19 declaration under Section 56                          4(b)(1) of the Act, 21 U.S.C. section 360bbb-3(b)(1), unless the authorization is terminated or revoked sooner. Performed at [facility], [place] [address], [place], [place] [address]   Hospital Outpatient Visit on 01/14/2020  Component Date Value Ref Range Status  . MRSA, PCR 01/14/2020 NEGATIVE  NEGATIVE Final  . Staphylococcus aureus 01/14/2020 NEGATIVE  NEGATIVE Final   Comment: (NOTE) The Xpert SA Assay (FDA approved for NASAL specimens in  patients 48 years of age and older), is one component of a comprehensive surveillance program. It is not intended to diagnose infection nor to guide or monitor treatment. Performed at [facility], [place] [address], [place], [place] [address]   . aPTT 01/14/2020 33  24 - 36 seconds Final   Performed at [facility], [place] [address], [place],  [address]  . WBC 01/14/2020 13.5* 4.0 - 10.5 K/uL Final  . RBC 01/14/2020 4.65  3.87 - 5.11 MIL/uL  Final  . Hemoglobin 01/14/2020 13.2  12.0 - 15.0 g/dL Final  . HCT 01/14/2020 41.0  36.0 - 46.0 % Final  . MCV 01/14/2020 88.2  80.0 - 100.0 fL Final  . MCH 01/14/2020 28.4  26.0 - 34.0 pg Final  . MCHC 01/14/2020 32.2  30.0 - 36.0 g/dL Final  . RDW 01/14/2020 13.5  11.5 - 15.5 % Final  . Platelets 01/14/2020 325  150 - 400 K/uL Final  . nRBC 01/14/2020 0.0  0.0 - 0.2 % Final   Performed at [facility], [place] [address], [place], [place] [address]  . Sodium 01/14/2020 135  135 - 145 mmol/L Final  . Potassium 01/14/2020 3.2* 3.5 - 5.1 mmol/L Final  . Chloride 01/14/2020 97* 98 - 111 mmol/L Final  . CO2 01/14/2020 26  22 - 32 mmol/L Final  . Glucose, Bld 01/14/2020 162* 70 - 99 mg/dL Final   Glucose reference range applies only to samples taken after fasting for at least 8 hours.  . BUN 01/14/2020 14  8 - 23 mg/dL Final  . Creatinine, Ser 01/14/2020 0.66  0.44 - 1.00 mg/dL Final  . Calcium 01/14/2020 9.4  8.9 - 10.3 mg/dL Final  . Total Protein 01/14/2020 7.3  6.5 - 8.1 g/dL Final  . Albumin 01/14/2020 3.7  3.5 - 5.0 g/dL Final  . AST 01/14/2020 17  15 - 41 U/L Final  . ALT 01/14/2020 19  0 - 44 U/L Final  . Alkaline Phosphatase 01/14/2020 53  38 - 126 U/L Final  . Total Bilirubin 01/14/2020 0.8  0.3 - 1.2 mg/dL Final  . GFR calc non Af Amer 01/14/2020 >60  >60 mL/min Final  . GFR calc Af Amer 01/14/2020 >60  >60 mL/min Final  . Anion gap 01/14/2020 12  5 - 15 Final   Performed at  [facility], [place] [address] [place], [place] [address]  . Prothrombin Time 01/14/2020 13.0  11.4 - 15.2 seconds Final  . INR 01/14/2020 1.0  0.8 - 1.2 Final   Comment: (NOTE) INR goal varies based on device and disease states. Performed at [facility], [place] [address] [place], [place] [address]   . ABO/RH(D) 01/14/2020 A POS   Final  . Antibody Screen 01/14/2020 NEG   Final  . Sample Expiration 01/14/2020 01/22/2020,2359   Final  . Extend sample reason 01/14/2020    Final                   Value:NO TRANSFUSIONS OR PREGNANCY IN THE PAST 3 MONTHS Performed at [facility], [place] [address], [place], [place] [address]   . Glucose-Capillary 01/14/2020 171* 70 - 99 mg/dL Final   Glucose reference range applies only to samples taken after fasting for at least 8 hours.     X-Rays:DG Pelvis Portable  Result Date: 01/19/2020 CLINICAL DATA:  Status post left hip arthroplasty EXAM: PORTABLE PELVIS 1 VIEWS COMPARISON:  Intraoperative films from earlier in the same day. FINDINGS: Left hip prosthesis is noted in satisfactory position. Pelvic ring is intact. No soft tissue abnormality is noted. IMPRESSION: Status post left hip replacement Electronically Signed   By: Inez Catalina M.D.   On: 01/19/2020 16:31   DG C-Arm 1-60 Min-No Report  Result Date: 01/19/2020 Fluoroscopy was utilized by the requesting physician.  No radiographic interpretation.   DG HIP OPERATIVE UNILAT W OR W/O PELVIS LEFT  Result Date: 01/19/2020 CLINICAL DATA:  Post left hip arthroplasty  EXAM: OPERATIVE LEFT HIP (WITH PELVIS IF PERFORMED) 2 VIEWS TECHNIQUE: Fluoroscopic spot image(s) were submitted for interpretation post-operatively. COMPARISON:  Radiograph 11/26/2019 FINDINGS: Intraoperative fluoroscopic images depict interval total left hip arthroplasty. Acetabular cup in articulating femoral stem or in expected alignment without acute complication evident on these  intraoperative fluoroscopic images. Remaining osseous structures are unremarkable. Expected postsurgical changes are seen within the soft tissues including subcutaneous and intra-articular gas. IMPRESSION: Status post total left hip arthroplasty. Electronically Signed   By: Lovena Le M.D.   On: 01/19/2020 15:21    EKG: Orders placed or performed in visit on 01/09/15  . EKG 12-Lead     Hospital Course: Bonnie Gray is a 79 y.o. who was admitted to [facility]. They were brought to the operating room on 01/19/2020 and underwent Procedure(s): [place].  Patient tolerated the procedure well and was later transferred to the recovery room and then to the orthopaedic floor for postoperative care. They were given PO and IV analgesics for pain control following their surgery. They were given 24 hours of postoperative antibiotics of  Anti-infectives (From admission, onward)   Start     Dose/Rate Route Frequency Ordered Stop   01/19/20 1800  ceFAZolin (ANCEF) IVPB 2g/100 mL premix     2 g 200 mL/hr over 30 Minutes Intravenous Every 6 hours 01/19/20 1413 01/20/20 0114   01/19/20 1100  ceFAZolin (ANCEF) IVPB 2g/100 mL premix     2 g 200 mL/hr over 30 Minutes Intravenous On call to O.R. 01/19/20 1046 01/19/20 1217     and started on DVT prophylaxis in the form of Aspirin.   PT and OT were ordered for total joint protocol. Discharge planning consulted to help with postop disposition and equipment needs. Patient had a good night on the evening of surgery. They started to get up OOB with therapy on POD #0. Hemovac drain was pulled without difficulty on day one. Continued to work with therapy into POD #2. Pt was seen during rounds on day two and was ready to go home pending progress with therapy. Dressing was changed and the incision was clean and intact. Pt worked with therapy for two additional sessions and was meeting their goals. She was discharged to SNF later  that day in stable condition.  Diet: Regular diet Activity: WBAT Follow-up: in 2 weeks Disposition: Skilled nursing facility Discharged Condition: good    Allergies as of 01/21/2020      Reactions   Exforge [amlodipine Besylate-valsartan] Other (See Comments)   Flush;headache   Oxycodone Hcl Other (See Comments)   Hallucinations   Norvasc [amlodipine Besylate]    Rash   Robaxin [methocarbamol] Nausea Only, Other (See Comments)   depression   Caduet [amlodipine-atorvastatin] Rash   Celebrex [celecoxib]    hives   Flexeril [cyclobenzaprine] Rash      Medication List    STOP taking these medications   acetaminophen 500 MG tablet Commonly known as: TYLENOL   CORICIDIN HBP PO   diclofenac 75 MG EC tablet Commonly known as: VOLTAREN   multivitamin with minerals Tabs tablet   PRESERVISION AREDS PO     TAKE these medications   amoxicillin 500 MG capsule Commonly known as: AMOXIL Take 2,000 mg by mouth See admin instructions. Take 4 capsules (2000 mg) by mouth 1 hour prior to dental procedures   aspirin 325 MG EC tablet Take 1 tablet (325 mg total) by mouth 2 (two) times daily for 20 days. Then take  aspirin 81 mg daily for 3 weeks.   diazepam 2 MG tablet Commonly known as: VALIUM Take 1 mg by mouth every 8 (eight) hours as needed for muscle spasms.   fluticasone 50 MCG/ACT nasal spray Commonly known as: FLONASE Place 1 spray into both nostrils daily as needed for allergies or rhinitis.   HYDROcodone-acetaminophen 5-325 MG tablet Commonly known as: NORCO/VICODIN Take 1-2 tablets by mouth every 6 (six) hours as needed for moderate pain (pain score 4-6).   Jardiance 10 MG Tabs tablet Generic drug: empagliflozin Take 10 mg by mouth every other day. In the morning   lisinopril-hydrochlorothiazide 20-12.5 MG tablet Commonly known as: ZESTORETIC Take 1 tablet by mouth daily.   loratadine 10 MG tablet Commonly known as: CLARITIN Take 10 mg by mouth daily.     metFORMIN 500 MG tablet Commonly known as: GLUCOPHAGE Take 500 mg by mouth daily.   metoprolol tartrate 50 MG tablet Commonly known as: LOPRESSOR Take 50 mg by mouth daily.   shark liver oil-cocoa butter 0.25-3-85.5 % suppository Commonly known as: PREPARATION H Place 1 suppository rectally as needed for hemorrhoids.   sodium fluoride 1.1 % Gel dental gel Commonly known as: FLUORISHIELD Place 1 application onto teeth at bedtime.   SYSTANE OP Apply 1 drop to eye 2 (two) times daily as needed (Dry eyes).   tetrahydrozoline 0.05 % ophthalmic solution Place 1 drop into both eyes 3 (three) times daily as needed (dry/irritated eyes.).   traMADol 50 MG tablet Commonly known as: ULTRAM Take 1-2 tablets (50-100 mg total) by mouth every 6 (six) hours as needed for moderate pain.       Contact information for follow-up providers    Gaynelle Arabian, MD. Schedule an appointment as soon as possible for a visit on 02/03/2020.   Specialty: Orthopedic Surgery Contact information: [address] [place] [address] [id number]            Contact information for after-discharge care    [facility] .   Service: Skilled Nursing Contact information: [address] [place] [phone]                  Signed: Griffith Citron, PA-C Orthopedic Surgery 01/21/2020, 7:53 AM

## 2020-01-21 NOTE — Progress Notes (Signed)
Patient discharged to [facility] via [place]. All belongings w/ patient. Report called to facility.

## 2020-01-21 NOTE — TOC Transition Note (Signed)
Transition of Care [facility]) - CM/SW Discharge Note   Patient Details  Name: Bonnie Gray MRN: [id number] Date of Birth: 1941-08-06  Transition of Care [facility]) CM/SW Contact:  Lia Hopping, [place] Phone Number: 01/21/2020, 3:56 PM   Clinical Narrative:    [facility] ready to accept the patient.  Nurse call report to: [place]  Room 136 Bed 2 Daughter [place] notified.    Final next level of care: Skilled Nursing Facility Barriers to Discharge: Other (comment)(Pending covid)   Patient Goals and CMS Choice Patient states their goals for this hospitalization and ongoing recovery are:: "I just want to be able to walk again and sit on the porch" CMS Medicare.gov Compare Post Acute Care list provided to:: Patient Choice offered to / list presented to : Patient, Adult Children  Discharge Placement PASRR number recieved: 01/20/20            Patient chooses bed at: [facility]) Patient to be transferred to facility by: [place] Name of family member notified: Daughter Sharyn Lull Patient and family notified of of transfer: 01/21/20  Discharge Plan and Services In-house Referral: NA Discharge Planning Services: CM Consult Post Acute Care Choice: [place]                               Social Determinants of Health (SDOH) Interventions     Readmission Risk Interventions No flowsheet data found.

## 2020-01-21 NOTE — Care Management Important Message (Signed)
Important Message  Patient Details IM Letter given to [place] Case Manager to present to the Patient Name: Bonnie [place] MRN: [id number] Date of Birth: 10/19/1940   Medicare Important Message Given:  Yes     Kerin Salen 01/21/2020, 11:15 AM

## 2020-01-21 NOTE — TOC Progression Note (Signed)
Transition of Care [facility]) - Progression Note    Patient Details  Name: Bonnie Gray MRN: [id number] Date of Birth: 19-Jul-1941  Transition of Care [facility]) CM/SW [place], [place] Phone Number: 01/21/2020, 10:02 AM  Clinical Narrative:    Ship broker received. [id number], Approved for 5 Days, next review day 4/19 CSW provided details to SNF.  Patient discharge pending Covid test.     Expected Discharge Plan: Skilled Nursing Facility Barriers to Discharge: Other (comment)(Pending covid)  Expected Discharge Plan and Services Expected Discharge Plan: [place] In-house Referral: NA Discharge Planning Services: CM Consult Post Acute Care Choice: [place] Living arrangements for the past 2 months: Single Family Home Expected Discharge Date: 01/21/20                                     Social Determinants of Health (SDOH) Interventions    Readmission Risk Interventions No flowsheet data found.

## 2020-01-22 DIAGNOSIS — F330 Major depressive disorder, recurrent, mild: Secondary | ICD-10-CM | POA: Diagnosis not present

## 2020-01-22 DIAGNOSIS — G8929 Other chronic pain: Secondary | ICD-10-CM | POA: Diagnosis not present

## 2020-01-22 DIAGNOSIS — F411 Generalized anxiety disorder: Secondary | ICD-10-CM | POA: Diagnosis not present

## 2020-01-22 DIAGNOSIS — M160 Bilateral primary osteoarthritis of hip: Secondary | ICD-10-CM | POA: Diagnosis not present

## 2020-02-08 DIAGNOSIS — I10 Essential (primary) hypertension: Secondary | ICD-10-CM | POA: Diagnosis not present

## 2020-02-08 DIAGNOSIS — Z96642 Presence of left artificial hip joint: Secondary | ICD-10-CM | POA: Diagnosis not present

## 2020-02-08 DIAGNOSIS — G43909 Migraine, unspecified, not intractable, without status migrainosus: Secondary | ICD-10-CM | POA: Diagnosis not present

## 2020-02-08 DIAGNOSIS — Z7984 Long term (current) use of oral hypoglycemic drugs: Secondary | ICD-10-CM | POA: Diagnosis not present

## 2020-02-08 DIAGNOSIS — Z7982 Long term (current) use of aspirin: Secondary | ICD-10-CM | POA: Diagnosis not present

## 2020-02-08 DIAGNOSIS — K219 Gastro-esophageal reflux disease without esophagitis: Secondary | ICD-10-CM | POA: Diagnosis not present

## 2020-02-08 DIAGNOSIS — I4510 Unspecified right bundle-branch block: Secondary | ICD-10-CM | POA: Diagnosis not present

## 2020-02-08 DIAGNOSIS — M1990 Unspecified osteoarthritis, unspecified site: Secondary | ICD-10-CM | POA: Diagnosis not present

## 2020-02-08 DIAGNOSIS — K5792 Diverticulitis of intestine, part unspecified, without perforation or abscess without bleeding: Secondary | ICD-10-CM | POA: Diagnosis not present

## 2020-02-08 DIAGNOSIS — Z471 Aftercare following joint replacement surgery: Secondary | ICD-10-CM | POA: Diagnosis not present

## 2020-02-08 DIAGNOSIS — Z87440 Personal history of urinary (tract) infections: Secondary | ICD-10-CM | POA: Diagnosis not present

## 2020-02-08 DIAGNOSIS — G8929 Other chronic pain: Secondary | ICD-10-CM | POA: Diagnosis not present

## 2020-02-08 DIAGNOSIS — K5791 Diverticulosis of intestine, part unspecified, without perforation or abscess with bleeding: Secondary | ICD-10-CM | POA: Diagnosis not present

## 2020-02-08 DIAGNOSIS — R7303 Prediabetes: Secondary | ICD-10-CM | POA: Diagnosis not present

## 2020-02-08 DIAGNOSIS — H3530 Unspecified macular degeneration: Secondary | ICD-10-CM | POA: Diagnosis not present

## 2020-02-10 DIAGNOSIS — R631 Polydipsia: Secondary | ICD-10-CM | POA: Diagnosis not present

## 2020-02-10 DIAGNOSIS — M25559 Pain in unspecified hip: Secondary | ICD-10-CM | POA: Diagnosis not present

## 2020-02-22 DIAGNOSIS — Z471 Aftercare following joint replacement surgery: Secondary | ICD-10-CM | POA: Diagnosis not present

## 2020-02-22 DIAGNOSIS — Z96642 Presence of left artificial hip joint: Secondary | ICD-10-CM | POA: Diagnosis not present

## 2020-03-09 DIAGNOSIS — I10 Essential (primary) hypertension: Secondary | ICD-10-CM | POA: Diagnosis not present

## 2020-03-09 DIAGNOSIS — G8929 Other chronic pain: Secondary | ICD-10-CM | POA: Diagnosis not present

## 2020-03-09 DIAGNOSIS — E119 Type 2 diabetes mellitus without complications: Secondary | ICD-10-CM | POA: Diagnosis not present

## 2020-03-09 DIAGNOSIS — Z7982 Long term (current) use of aspirin: Secondary | ICD-10-CM | POA: Diagnosis not present

## 2020-03-09 DIAGNOSIS — Z7984 Long term (current) use of oral hypoglycemic drugs: Secondary | ICD-10-CM | POA: Diagnosis not present

## 2020-03-09 DIAGNOSIS — Z96642 Presence of left artificial hip joint: Secondary | ICD-10-CM | POA: Diagnosis not present

## 2020-03-09 DIAGNOSIS — I4510 Unspecified right bundle-branch block: Secondary | ICD-10-CM | POA: Diagnosis not present

## 2020-03-09 DIAGNOSIS — M1990 Unspecified osteoarthritis, unspecified site: Secondary | ICD-10-CM | POA: Diagnosis not present

## 2020-03-09 DIAGNOSIS — Z87440 Personal history of urinary (tract) infections: Secondary | ICD-10-CM | POA: Diagnosis not present

## 2020-03-09 DIAGNOSIS — K5791 Diverticulosis of intestine, part unspecified, without perforation or abscess with bleeding: Secondary | ICD-10-CM | POA: Diagnosis not present

## 2020-03-09 DIAGNOSIS — Z471 Aftercare following joint replacement surgery: Secondary | ICD-10-CM | POA: Diagnosis not present

## 2020-03-09 DIAGNOSIS — H3530 Unspecified macular degeneration: Secondary | ICD-10-CM | POA: Diagnosis not present

## 2020-03-09 DIAGNOSIS — K219 Gastro-esophageal reflux disease without esophagitis: Secondary | ICD-10-CM | POA: Diagnosis not present

## 2020-03-09 DIAGNOSIS — K5792 Diverticulitis of intestine, part unspecified, without perforation or abscess without bleeding: Secondary | ICD-10-CM | POA: Diagnosis not present

## 2020-03-09 DIAGNOSIS — G43909 Migraine, unspecified, not intractable, without status migrainosus: Secondary | ICD-10-CM | POA: Diagnosis not present

## 2020-03-09 DIAGNOSIS — R7303 Prediabetes: Secondary | ICD-10-CM | POA: Diagnosis not present

## 2020-04-07 DIAGNOSIS — R3981 Functional urinary incontinence: Secondary | ICD-10-CM | POA: Diagnosis not present

## 2020-04-07 DIAGNOSIS — E119 Type 2 diabetes mellitus without complications: Secondary | ICD-10-CM | POA: Diagnosis not present

## 2020-04-11 DIAGNOSIS — H353132 Nonexudative age-related macular degeneration, bilateral, intermediate dry stage: Secondary | ICD-10-CM | POA: Diagnosis not present

## 2020-05-01 DIAGNOSIS — H43393 Other vitreous opacities, bilateral: Secondary | ICD-10-CM | POA: Diagnosis not present

## 2020-06-08 DIAGNOSIS — E119 Type 2 diabetes mellitus without complications: Secondary | ICD-10-CM | POA: Diagnosis not present

## 2020-06-08 DIAGNOSIS — Z23 Encounter for immunization: Secondary | ICD-10-CM | POA: Diagnosis not present

## 2020-06-28 DIAGNOSIS — Z96642 Presence of left artificial hip joint: Secondary | ICD-10-CM | POA: Diagnosis not present

## 2020-06-28 DIAGNOSIS — Z471 Aftercare following joint replacement surgery: Secondary | ICD-10-CM | POA: Diagnosis not present

## 2020-08-10 DIAGNOSIS — M1990 Unspecified osteoarthritis, unspecified site: Secondary | ICD-10-CM | POA: Diagnosis not present

## 2020-08-10 DIAGNOSIS — E119 Type 2 diabetes mellitus without complications: Secondary | ICD-10-CM | POA: Diagnosis not present

## 2020-10-05 DIAGNOSIS — M1990 Unspecified osteoarthritis, unspecified site: Secondary | ICD-10-CM | POA: Diagnosis not present

## 2020-10-05 DIAGNOSIS — E119 Type 2 diabetes mellitus without complications: Secondary | ICD-10-CM | POA: Diagnosis not present

## 2020-11-02 DIAGNOSIS — H353132 Nonexudative age-related macular degeneration, bilateral, intermediate dry stage: Secondary | ICD-10-CM | POA: Diagnosis not present

## 2020-12-07 DIAGNOSIS — E119 Type 2 diabetes mellitus without complications: Secondary | ICD-10-CM | POA: Diagnosis not present

## 2020-12-07 DIAGNOSIS — F419 Anxiety disorder, unspecified: Secondary | ICD-10-CM | POA: Diagnosis not present

## 2020-12-21 DIAGNOSIS — E083293 Diabetes mellitus due to underlying condition with mild nonproliferative diabetic retinopathy without macular edema, bilateral: Secondary | ICD-10-CM | POA: Diagnosis not present

## 2021-02-07 DIAGNOSIS — E119 Type 2 diabetes mellitus without complications: Secondary | ICD-10-CM | POA: Diagnosis not present

## 2021-02-07 DIAGNOSIS — F419 Anxiety disorder, unspecified: Secondary | ICD-10-CM | POA: Diagnosis not present

## 2021-03-27 DIAGNOSIS — Z7984 Long term (current) use of oral hypoglycemic drugs: Secondary | ICD-10-CM | POA: Diagnosis not present

## 2021-03-27 DIAGNOSIS — H524 Presbyopia: Secondary | ICD-10-CM | POA: Diagnosis not present

## 2021-03-27 DIAGNOSIS — E113292 Type 2 diabetes mellitus with mild nonproliferative diabetic retinopathy without macular edema, left eye: Secondary | ICD-10-CM | POA: Diagnosis not present

## 2021-03-27 DIAGNOSIS — E11319 Type 2 diabetes mellitus with unspecified diabetic retinopathy without macular edema: Secondary | ICD-10-CM | POA: Diagnosis not present

## 2021-04-04 DIAGNOSIS — K589 Irritable bowel syndrome without diarrhea: Secondary | ICD-10-CM | POA: Diagnosis not present

## 2021-04-04 DIAGNOSIS — I10 Essential (primary) hypertension: Secondary | ICD-10-CM | POA: Diagnosis not present

## 2021-04-04 DIAGNOSIS — E119 Type 2 diabetes mellitus without complications: Secondary | ICD-10-CM | POA: Diagnosis not present

## 2021-07-18 DIAGNOSIS — K589 Irritable bowel syndrome without diarrhea: Secondary | ICD-10-CM | POA: Diagnosis not present

## 2021-07-18 DIAGNOSIS — Z23 Encounter for immunization: Secondary | ICD-10-CM | POA: Diagnosis not present

## 2021-07-18 DIAGNOSIS — R631 Polydipsia: Secondary | ICD-10-CM | POA: Diagnosis not present

## 2021-07-18 DIAGNOSIS — F419 Anxiety disorder, unspecified: Secondary | ICD-10-CM | POA: Diagnosis not present

## 2021-09-03 DIAGNOSIS — L509 Urticaria, unspecified: Secondary | ICD-10-CM | POA: Diagnosis not present

## 2021-09-03 DIAGNOSIS — I517 Cardiomegaly: Secondary | ICD-10-CM | POA: Diagnosis not present

## 2021-09-03 DIAGNOSIS — R0902 Hypoxemia: Secondary | ICD-10-CM | POA: Diagnosis not present

## 2021-09-03 DIAGNOSIS — L299 Pruritus, unspecified: Secondary | ICD-10-CM | POA: Diagnosis not present

## 2021-09-03 DIAGNOSIS — Z79899 Other long term (current) drug therapy: Secondary | ICD-10-CM | POA: Diagnosis not present

## 2021-09-03 DIAGNOSIS — R059 Cough, unspecified: Secondary | ICD-10-CM | POA: Diagnosis not present

## 2021-09-03 DIAGNOSIS — T7840XA Allergy, unspecified, initial encounter: Secondary | ICD-10-CM | POA: Diagnosis not present

## 2021-09-03 DIAGNOSIS — E119 Type 2 diabetes mellitus without complications: Secondary | ICD-10-CM | POA: Diagnosis not present

## 2021-09-03 DIAGNOSIS — I10 Essential (primary) hypertension: Secondary | ICD-10-CM | POA: Diagnosis not present

## 2021-09-03 DIAGNOSIS — K047 Periapical abscess without sinus: Secondary | ICD-10-CM | POA: Diagnosis not present

## 2021-09-11 DIAGNOSIS — R000 Tachycardia, unspecified: Secondary | ICD-10-CM | POA: Diagnosis not present

## 2021-09-11 DIAGNOSIS — E119 Type 2 diabetes mellitus without complications: Secondary | ICD-10-CM | POA: Diagnosis not present

## 2021-09-11 DIAGNOSIS — I10 Essential (primary) hypertension: Secondary | ICD-10-CM | POA: Diagnosis not present

## 2021-10-16 DIAGNOSIS — I10 Essential (primary) hypertension: Secondary | ICD-10-CM | POA: Diagnosis not present

## 2021-10-16 DIAGNOSIS — R631 Polydipsia: Secondary | ICD-10-CM | POA: Diagnosis not present

## 2021-10-16 DIAGNOSIS — I4891 Unspecified atrial fibrillation: Secondary | ICD-10-CM | POA: Diagnosis not present

## 2021-10-17 ENCOUNTER — Ambulatory Visit: Payer: Medicare Other | Admitting: Cardiology

## 2021-11-09 ENCOUNTER — Other Ambulatory Visit: Payer: Self-pay

## 2021-11-09 ENCOUNTER — Encounter: Payer: Self-pay | Admitting: Cardiology

## 2021-11-09 ENCOUNTER — Ambulatory Visit: Payer: Medicare Other | Admitting: Cardiology

## 2021-11-09 VITALS — BP 148/90 | HR 101 | Ht 63.5 in | Wt 209.0 lb

## 2021-11-09 DIAGNOSIS — I480 Paroxysmal atrial fibrillation: Secondary | ICD-10-CM

## 2021-11-09 DIAGNOSIS — I10 Essential (primary) hypertension: Secondary | ICD-10-CM | POA: Diagnosis not present

## 2021-11-09 DIAGNOSIS — I4819 Other persistent atrial fibrillation: Secondary | ICD-10-CM | POA: Diagnosis not present

## 2021-11-09 MED ORDER — METOPROLOL TARTRATE 25 MG PO TABS
25.0000 mg | ORAL_TABLET | Freq: Two times a day (BID) | ORAL | 3 refills | Status: DC
Start: 2021-11-09 — End: 2022-05-15

## 2021-11-09 NOTE — Progress Notes (Signed)
Cardiology Office Note  Date: 11/09/2021   ID: Bonnie Gray, Bonnie Gray 30-Dec-1940, MRN [id number]  PCP:  Ranae Palms, MD  Cardiologist:  Rozann Lesches, MD Electrophysiologist:  None   Chief Complaint  Patient presents with   Atrial Fibrillation    History of Present Illness: Bonnie Gray is an 81 y.o. female referred for cardiology consultation by Dr. Hampton Abbot for evaluation of atrial fibrillation.  She is here today with her daughter.  Records indicate ER visit at [facility] back in November 2022 with allergic reaction felt to be secondary to lisinopril, medication was discontinued.  She was treated with Benadryl, prednisone, and Pepcid at that time. More recently she saw Dr. Hampton Abbot and was noted to be in rapid atrial fibrillation at routine encounter.  She has no documented history of atrial fibrillation.  ECG over time has shown sinus rhythm with right bundle branch block.  She tells me that she actually stopped all of her medications after ER encounter in November 2022.  This includes metformin, Jardiance, metoprolol, multivitamin, and lisinopril-HCTZ.  She has a pending visit to see Dr. Hampton Abbot for further discussion in terms of blood pressure and her blood sugars.  She is not aware of any sense of palpitations at baseline.  I personally reviewed her ECG today which shows sinus tachycardia with increased voltage and right bundle branch block.  CHA2DS2-VASc score is 5.  We discussed stroke risk and rationale for anticoagulation.  Also plan for follow-up lab work and echocardiogram.  Past Medical History:  Diagnosis Date   Anxiety    Cataract    Chronic pain    Depression    Diverticulitis    Essential hypertension    GERD (gastroesophageal reflux disease)    Hemorrhoids    History of blood transfusion    Macular degeneration    Migraine    Osteoarthritis    Pre-diabetes    Wears dentures     Past Surgical History:  Procedure Laterality Date   APPENDECTOMY   1956   CATARACT EXTRACTION Left 2010   CHOLECYSTECTOMY  1995   COLONOSCOPY  4 years ago   DILATION AND CURETTAGE OF UTERUS     ESOPHAGOGASTRODUODENOSCOPY  4 years ago   EYE SURGERY Left    cataract extraction with IOL   FOOT SURGERY     bunion and growth in arch. ( left foot)   INCISION AND DRAINAGE Left 08/01/2015   Procedure: DEBRIDEMENT OF LEFT KNEE WOUND WITH CLOSURE;  Surgeon: Rod Can, MD;  [facility];  Service: Orthopedics;  Laterality: Left;   INJECTION KNEE Left 01/09/2015   Procedure: KNEE INJECTION;  Surgeon: Gaynelle Arabian, MD;  [facility];  Service: Orthopedics;  Laterality: Left;   KNEE ARTHROSCOPY Right    2009   MALONEY DILATION  01/16/2012   Procedure: MALONEY DILATION;  Surgeon: Rogene Houston, MD;  [facility];  Service: Endoscopy;  Laterality: N/A;   NASAL SINUS SURGERY  1979   TOTAL ABDOMINAL HYSTERECTOMY  1983   endometriosis   TOTAL HIP ARTHROPLASTY Left 01/19/2020   Procedure: TOTAL HIP ARTHROPLASTY ANTERIOR APPROACH;  Surgeon: Gaynelle Arabian, MD;  [facility];  Service: Orthopedics;  Laterality: Left;   TOTAL KNEE ARTHROPLASTY Right 01/09/2015   Procedure: RIGHT TOTAL KNEE ARTHROPLASTY;  Surgeon: Gaynelle Arabian, MD;  [facility];  Service: Orthopedics;  Laterality: Right;   TOTAL KNEE ARTHROPLASTY Left 06/26/2015   Procedure: LEFT TOTAL KNEE ARTHROPLASTY;  Surgeon: Gaynelle Arabian,  MD;  [facility];  Service: Orthopedics;  Laterality: Left;    Current Outpatient Medications  Medication Sig Dispense Refill   metoprolol tartrate (LOPRESSOR) 25 MG tablet Take 1 tablet (25 mg total) by mouth 2 (two) times daily. 180 tablet 3   No current facility-administered medications for this visit.   Allergies:  Exforge [amlodipine besylate-valsartan], Oxycodone hcl, Norvasc [amlodipine besylate], Robaxin [methocarbamol], Caduet [amlodipine-atorvastatin], Celebrex [celecoxib], and Flexeril [cyclobenzaprine]   Social History: The patient   reports that she has never smoked. She has never used smokeless tobacco. She reports that she does not currently use alcohol. She reports that she does not use drugs.   Family History: The patient's family history is not on file.   ROS: Chronic arthritic pains, has undergone hip and knee surgery.  Using a walker.  Physical Exam: VS:  BP (!) 148/90    Pulse (!) 101    Ht 5' 3.5" (1.613 m)    Wt 209 lb (94.8 kg)    SpO2 93%    BMI 36.44 kg/m , BMI Body mass index is 36.44 kg/m.  Wt Readings from Last 3 Encounters:  11/09/21 209 lb (94.8 kg)  01/19/20 205 lb 0.4 oz (93 kg)  01/14/20 205 lb (93 kg)    General: Patient appears comfortable at rest. HEENT: Conjunctiva and lids normal, wearing a mask. Neck: Supple, no elevated JVP or carotid bruits, no thyromegaly. Lungs: Clear to auscultation, nonlabored breathing at rest. Cardiac: Regular rate and rhythm, no S3, 1/6 systolic murmur, no pericardial rub. Abdomen: Soft, nontender, bowel sounds present. Extremities: No pitting edema, distal pulses 2+. Skin: Warm and dry. Musculoskeletal: No kyphosis. Neuropsychiatric: Alert and oriented x3, affect grossly appropriate.  ECG:  An ECG dated 09/12/2021 was personally reviewed today and demonstrated:  Atrial fibrillation with RVR, right bundle branch block.  Recent Labwork:  November 2022: Hemoglobin 15.3, platelets 218, potassium 3.8, BUN 14, creatinine 0.94, AST 17, ALT 20  Other Studies Reviewed Today:  Chest x-ray 09/03/2021 [facility]): FINDINGS:  Cardiomegaly. Both lungs are clear. The visualized skeletal  structures are unremarkable.   IMPRESSION:  Cardiomegaly without acute abnormality of the lungs in [facility] portable  projection.   Assessment and Plan:  1.  Paroxysmal to persistent atrial fibrillation diagnosed incidentally by ECG in December 2022 at routine OV with PCP.  She does not report any regular sense of palpitations.  She is in sinus rhythm today.  CHA2DS2-VASc score  is 5.  I have asked her to resume Lopressor at 25 mg twice daily, we will obtain CBC and BMET with plan to initiate DOAC, also an echocardiogram to assess cardiac structure and function.  Office follow-up arranged.  2.  Essential hypertension.  She is no longer on lisinopril HCTZ.  Metoprolol is being resumed.  3.  Type 2 diabetes mellitus, previously on metformin and Jardiance.  She stopped both medications as discussed above.  I have asked her to review this with Dr. Hampton Abbot.  Medication Adjustments/Labs and Tests Ordered: Current medicines are reviewed at length with the patient today.  Concerns regarding medicines are outlined above.   Tests Ordered: Orders Placed This Encounter  Procedures   CBC   Basic metabolic panel   EKG [id number]   ECHOCARDIOGRAM COMPLETE    Medication Changes: Meds ordered this encounter  Medications   metoprolol tartrate (LOPRESSOR) 25 MG tablet    Sig: Take 1 tablet (25 mg total) by mouth 2 (two) times daily.    Dispense:  180 tablet  Refill:  3    Disposition:  Follow up  6 weeks.  Signed, Satira Sark, MD, [facility] 11/09/2021 1:56 PM  [place] at [place], [place], [place] [address] Phone: [phone]; Fax: [phone]

## 2021-11-09 NOTE — Patient Instructions (Addendum)
Medication Instructions:  START Lopressor 25 mg tablets twice daily  Labwork: CBC BMET  Testing/Procedures: Your physician has requested that you have an echocardiogram. Echocardiography is a painless test that uses sound waves to create images of your heart. It provides your doctor with information about the size and shape of your heart and how well your hearts chambers and valves are working. This procedure takes approximately one hour. There are no restrictions for this procedure.   Follow-Up: Follow up with Dr.McDowell in 6 months  Any Other Special Instructions Will Be Listed Below (If Applicable).     If you need a refill on your cardiac medications before your next appointment, please call your pharmacy.

## 2021-11-13 ENCOUNTER — Other Ambulatory Visit (HOSPITAL_COMMUNITY)
Admission: RE | Admit: 2021-11-13 | Discharge: 2021-11-13 | Disposition: A | Discharge status: Home / Self Care | Payer: Medicare Other | Source: Ambulatory Visit | Attending: Cardiology | Admitting: Cardiology

## 2021-11-13 DIAGNOSIS — I4819 Other persistent atrial fibrillation: Secondary | ICD-10-CM | POA: Insufficient documentation

## 2021-11-13 LAB — BASIC METABOLIC PANEL
Anion gap: 7 (ref 5–15)
BUN: 16 mg/dL (ref 8–23)
CO2: 25 mmol/L (ref 22–32)
Calcium: 9.3 mg/dL (ref 8.9–10.3)
Chloride: 107 mmol/L (ref 98–111)
Creatinine, Ser: 0.79 mg/dL (ref 0.44–1.00)
GFR, Estimated: >60 mL/min (ref >60)
Glucose, Bld: 110 mg/dL — ABNORMAL HIGH (ref 70–99)
Potassium: 3.8 mmol/L (ref 3.5–5.1)
Sodium: 139 mmol/L (ref 135–145)

## 2021-11-13 LAB — CBC
HCT: 44.3 % (ref 36.0–46.0)
Hemoglobin: 14.6 g/dL (ref 12.0–15.0)
MCH: 30.2 pg (ref 26.0–34.0)
MCHC: 33 g/dL (ref 30.0–36.0)
MCV: 91.7 fL (ref 80.0–100.0)
Platelets: 241 10*3/uL (ref 150–400)
RBC: 4.83 MIL/uL (ref 3.87–5.11)
RDW: 13.9 % (ref 11.5–15.5)
WBC: 9.6 10*3/uL (ref 4.0–10.5)
nRBC: 0 % (ref 0.0–0.2)

## 2021-11-20 DIAGNOSIS — Z0000 Encounter for general adult medical examination without abnormal findings: Secondary | ICD-10-CM | POA: Diagnosis not present

## 2021-11-20 DIAGNOSIS — E119 Type 2 diabetes mellitus without complications: Secondary | ICD-10-CM | POA: Diagnosis not present

## 2021-11-21 DIAGNOSIS — R3981 Functional urinary incontinence: Secondary | ICD-10-CM | POA: Diagnosis not present

## 2021-11-21 DIAGNOSIS — Z0000 Encounter for general adult medical examination without abnormal findings: Secondary | ICD-10-CM | POA: Diagnosis not present

## 2021-11-21 DIAGNOSIS — E785 Hyperlipidemia, unspecified: Secondary | ICD-10-CM | POA: Diagnosis not present

## 2021-11-21 DIAGNOSIS — E119 Type 2 diabetes mellitus without complications: Secondary | ICD-10-CM | POA: Diagnosis not present

## 2021-11-22 ENCOUNTER — Telehealth: Payer: Self-pay | Admitting: Cardiology

## 2021-11-22 DIAGNOSIS — Z79899 Other long term (current) drug therapy: Secondary | ICD-10-CM

## 2021-11-22 DIAGNOSIS — I4819 Other persistent atrial fibrillation: Secondary | ICD-10-CM

## 2021-11-22 MED ORDER — APIXABAN 5 MG PO TABS: 5.0000 mg | ORAL_TABLET | Freq: Two times a day (BID) | ORAL | 6 refills | Status: DC

## 2021-11-22 NOTE — Telephone Encounter (Signed)
° °  Pt is returning call to get lab result °

## 2021-11-22 NOTE — Telephone Encounter (Signed)
Patient informed and verbalized understanding of plan. Copy sent to PCP Lab orders left up front for pick up with eliquis voucher

## 2021-11-22 NOTE — Telephone Encounter (Signed)
-----   Message from Satira Sark, MD sent at 11/18/2021  2:28 PM EST ----- Results reviewed.  Renal function, potassium, and hemoglobin are normal.  Could consider initiation of Eliquis 5 mg twice daily for stroke prophylaxis as discussed at recent office visit.Marland Kitchen  Keep office follow-up and would repeat CBC and BMET for that visit.

## 2021-11-27 DIAGNOSIS — N390 Urinary tract infection, site not specified: Secondary | ICD-10-CM | POA: Diagnosis not present

## 2021-12-06 DIAGNOSIS — N390 Urinary tract infection, site not specified: Secondary | ICD-10-CM | POA: Diagnosis not present

## 2021-12-06 DIAGNOSIS — R631 Polydipsia: Secondary | ICD-10-CM | POA: Diagnosis not present

## 2021-12-06 DIAGNOSIS — I10 Essential (primary) hypertension: Secondary | ICD-10-CM | POA: Diagnosis not present

## 2021-12-11 ENCOUNTER — Ambulatory Visit (INDEPENDENT_AMBULATORY_CARE_PROVIDER_SITE_OTHER): Payer: Medicare Other

## 2021-12-11 DIAGNOSIS — I480 Paroxysmal atrial fibrillation: Secondary | ICD-10-CM

## 2021-12-12 LAB — ECHOCARDIOGRAM COMPLETE
AR max vel: 2.27 cm2
AV Area VTI: 2.3 cm2
AV Area mean vel: 1.98 cm2
AV Mean grad: 5.4 mmHg
AV Peak grad: 9.6 mmHg
Ao pk vel: 1.55 m/s
Area-P 1/2: 3.4 cm2
Calc EF: 54 %
MV VTI: 2.43 cm2
S' Lateral: 3.17 cm
Single Plane A2C EF: 52.6 %
Single Plane A4C EF: 57 %

## 2022-01-01 DIAGNOSIS — E1165 Type 2 diabetes mellitus with hyperglycemia: Secondary | ICD-10-CM | POA: Diagnosis not present

## 2022-01-31 DIAGNOSIS — E1165 Type 2 diabetes mellitus with hyperglycemia: Secondary | ICD-10-CM | POA: Diagnosis not present

## 2022-02-07 DIAGNOSIS — R1110 Vomiting, unspecified: Secondary | ICD-10-CM | POA: Diagnosis not present

## 2022-02-11 DIAGNOSIS — I10 Essential (primary) hypertension: Secondary | ICD-10-CM | POA: Diagnosis not present

## 2022-03-14 DIAGNOSIS — E119 Type 2 diabetes mellitus without complications: Secondary | ICD-10-CM | POA: Diagnosis not present

## 2022-04-11 DIAGNOSIS — E119 Type 2 diabetes mellitus without complications: Secondary | ICD-10-CM | POA: Diagnosis not present

## 2022-04-11 DIAGNOSIS — N390 Urinary tract infection, site not specified: Secondary | ICD-10-CM | POA: Diagnosis not present

## 2022-05-08 DIAGNOSIS — I4819 Other persistent atrial fibrillation: Secondary | ICD-10-CM | POA: Diagnosis not present

## 2022-05-14 NOTE — Progress Notes (Unsigned)
Cardiology Office Note  Date: 05/15/2022   ID: Steven, Veazie Dec 12, 1940, MRN [id number]  PCP:  Ranae Palms, MD  Cardiologist:  Rozann Lesches, MD Electrophysiologist:  None   Chief Complaint  Patient presents with   Cardiac follow-up    History of Present Illness: Bonnie Gray is an 81 y.o. female last seen in February.  She is here today with family member for a follow-up visit.  She does not report any palpitations since last visit.  Has been having some trouble with dental caries and loose teeth with plan for dental work.  We have discussed being off Eliquis at least 48 hours prior to any extensive dental extractions.  She otherwise continues on Lopressor.  Echocardiogram in March revealed LVEF 60 to 65% with moderate LVH and mild diastolic dysfunction, severe mitral annular calcification with mild mitral stenosis, sclerotic aortic valve with mild aortic regurgitation.  I reviewed her recent lab work as noted below.  She does not report any spontaneous bleeding problems.  Past Medical History:  Diagnosis Date   Anxiety    Cataract    Chronic pain    Depression    Diverticulitis    Essential hypertension    GERD (gastroesophageal reflux disease)    Hemorrhoids    History of blood transfusion    Macular degeneration    Migraine    Osteoarthritis    Pre-diabetes    Wears dentures     Past Surgical History:  Procedure Laterality Date   APPENDECTOMY  1956   CATARACT EXTRACTION Left 2010   CHOLECYSTECTOMY  1995   COLONOSCOPY  4 years ago   DILATION AND CURETTAGE OF UTERUS     ESOPHAGOGASTRODUODENOSCOPY  4 years ago   EYE SURGERY Left    cataract extraction with IOL   FOOT SURGERY     bunion and growth in arch. ( left foot)   INCISION AND DRAINAGE Left 08/01/2015   Procedure: DEBRIDEMENT OF LEFT KNEE WOUND WITH CLOSURE;  Surgeon: Rod Can, MD;  [facility];  Service: Orthopedics;  Laterality: Left;   INJECTION KNEE Left 01/09/2015    Procedure: KNEE INJECTION;  Surgeon: Gaynelle Arabian, MD;  [facility];  Service: Orthopedics;  Laterality: Left;   KNEE ARTHROSCOPY Right    2009   MALONEY DILATION  01/16/2012   Procedure: MALONEY DILATION;  Surgeon: Rogene Houston, MD;  [facility];  Service: Endoscopy;  Laterality: N/A;   NASAL SINUS SURGERY  1979   TOTAL ABDOMINAL HYSTERECTOMY  1983   endometriosis   TOTAL HIP ARTHROPLASTY Left 01/19/2020   Procedure: TOTAL HIP ARTHROPLASTY ANTERIOR APPROACH;  Surgeon: Gaynelle Arabian, MD;  [facility];  Service: Orthopedics;  Laterality: Left;   TOTAL KNEE ARTHROPLASTY Right 01/09/2015   Procedure: RIGHT TOTAL KNEE ARTHROPLASTY;  Surgeon: Gaynelle Arabian, MD;  [facility];  Service: Orthopedics;  Laterality: Right;   TOTAL KNEE ARTHROPLASTY Left 06/26/2015   Procedure: LEFT TOTAL KNEE ARTHROPLASTY;  Surgeon: Gaynelle Arabian, MD;  [facility];  Service: Orthopedics;  Laterality: Left;    Current Outpatient Medications  Medication Sig Dispense Refill   amoxicillin (AMOXIL) 500 MG capsule Take 1 capsule by mouth. As needed for dental procedures     apixaban (ELIQUIS) 5 MG TABS tablet Take 1 tablet (5 mg total) by mouth 2 (two) times daily. Has voucher 60 tablet 6   diazepam (VALIUM) 2 MG tablet Take 1 tablet by mouth daily as needed.  DM-APAP-CPM (CORICIDIN HBP PO) Take by mouth as needed.     empagliflozin (JARDIANCE) 10 MG TABS tablet Take 1 tablet by mouth daily.     loratadine (CLARITIN) 10 MG tablet Take 1 tablet by mouth daily.     metoprolol tartrate (LOPRESSOR) 25 MG tablet Take 50 mg by mouth in the morning. & 25 mg in the evening     Multiple Vitamins-Minerals (PRESERVISION AREDS PO) Take 1 tablet by mouth daily.     No current facility-administered medications for this visit.   Allergies:  Exforge [amlodipine besylate-valsartan], Oxycodone hcl, Hydromorphone, Norvasc [amlodipine besylate], Robaxin [methocarbamol], Caduet [amlodipine-atorvastatin],  Celebrex [celecoxib], and Flexeril [cyclobenzaprine]   ROS: No dizziness or syncope.  Physical Exam: VS:  BP (!) 140/82   Pulse 80   Ht 5' (1.524 m)   Wt 203 lb 3.2 oz (92.2 kg)   SpO2 94%   BMI 39.68 kg/m , BMI Body mass index is 39.68 kg/m.  Wt Readings from Last 3 Encounters:  05/15/22 203 lb 3.2 oz (92.2 kg)  11/09/21 209 lb (94.8 kg)  01/19/20 205 lb 0.4 oz (93 kg)    General: Patient appears comfortable at rest. HEENT: Conjunctiva and lids normal. Neck: Supple, no elevated JVP or carotid bruits, no thyromegaly. Lungs: Clear to auscultation, nonlabored breathing at rest. Cardiac: Regular rate and rhythm, no S3, 1/6 systolic murmur. Extremities: No pitting edema.  ECG:  An ECG dated 11/09/2021 was personally reviewed today and demonstrated:  Sinus tachycardia with right bundle branch block and LVH.  Recent Labwork: 11/13/2021: BUN 16; Creatinine, Ser 0.79; Hemoglobin 14.6; Platelets 241; Potassium 3.8; Sodium 139  August 2023: Potassium 4.1, BUN 14, creatinine 0.87, hemoglobin 14.4, platelets 205  Other Studies Reviewed Today:  Echocardiogram 12/11/2021:  1. Left ventricular ejection fraction, by estimation, is 60 to 65%. The  left ventricle has normal function. The left ventricle has no regional  wall motion abnormalities. There is moderate left ventricular hypertrophy.  Left ventricular diastolic  parameters are consistent with Grade I diastolic dysfunction (impaired  relaxation). Elevated left atrial pressure.   2. Right ventricular systolic function is normal. The right ventricular  size is normal. Tricuspid regurgitation signal is inadequate for assessing  PA pressure.   3. The mitral valve is abnormal. No evidence of mitral valve  regurgitation. Mild mitral stenosis. Severe mitral annular calcification.   4. The tricuspid valve is abnormal.   5. The aortic valve has an indeterminant number of cusps. There is  moderate calcification of the aortic valve. There is  moderate thickening  of the aortic valve. Aortic valve regurgitation is mild. Aortic valve  sclerosis/calcification is present, without   any evidence of aortic stenosis.   6. Aortic dilatation noted. There is mild dilatation of the ascending  aorta, measuring 37 mm.   7. The inferior vena cava is normal in size with greater than 50%  respiratory variability, suggesting right atrial pressure of 3 mmHg.   Assessment and Plan:  1.  Paroxysmal atrial fibrillation with CHA2DS2-VASc score of 5.  She is tolerating Eliquis for stroke prophylaxis, recent lab work reviewed and stable.  She reports no spontaneous bleeding problems.  Continue Lopressor and observation.  Of note, she does anticipate dental work with several extractions, will need to be off Eliquis at least 48 hours prior to procedure.  I asked her to have her dental provider contact our office for official recommendations.  2.  Essential hypertension, she continues to follow with Dr. Hampton Abbot and is on  Lopressor alone at this time.  Medication Adjustments/Labs and Tests Ordered: Current medicines are reviewed at length with the patient today.  Concerns regarding medicines are outlined above.   Tests Ordered: No orders of the defined types were placed in this encounter.   Medication Changes: No orders of the defined types were placed in this encounter.   Disposition:  Follow up  6 months.  Signed, Satira Sark, MD, [facility] 05/15/2022 1:21 PM    [facility] at [place], [place], [place] [address] Phone: [phone]; Fax: [phone]

## 2022-05-15 ENCOUNTER — Encounter: Payer: Self-pay | Admitting: Cardiology

## 2022-05-15 ENCOUNTER — Ambulatory Visit: Payer: Medicare Other | Admitting: Cardiology

## 2022-05-15 VITALS — BP 140/82 | HR 80 | Ht 60.0 in | Wt 203.2 lb

## 2022-05-15 DIAGNOSIS — I4819 Other persistent atrial fibrillation: Secondary | ICD-10-CM | POA: Diagnosis not present

## 2022-05-15 DIAGNOSIS — I10 Essential (primary) hypertension: Secondary | ICD-10-CM | POA: Diagnosis not present

## 2022-05-15 NOTE — Patient Instructions (Addendum)

## 2022-05-16 DIAGNOSIS — E119 Type 2 diabetes mellitus without complications: Secondary | ICD-10-CM | POA: Diagnosis not present

## 2022-05-20 DIAGNOSIS — E119 Type 2 diabetes mellitus without complications: Secondary | ICD-10-CM | POA: Diagnosis not present

## 2022-05-20 DIAGNOSIS — M19041 Primary osteoarthritis, right hand: Secondary | ICD-10-CM | POA: Diagnosis not present

## 2022-05-20 DIAGNOSIS — M545 Low back pain, unspecified: Secondary | ICD-10-CM | POA: Diagnosis not present

## 2022-05-20 DIAGNOSIS — M19042 Primary osteoarthritis, left hand: Secondary | ICD-10-CM | POA: Diagnosis not present

## 2022-07-18 ENCOUNTER — Other Ambulatory Visit: Payer: Self-pay | Admitting: Cardiology

## 2022-07-18 DIAGNOSIS — I4819 Other persistent atrial fibrillation: Secondary | ICD-10-CM

## 2022-07-18 NOTE — Telephone Encounter (Signed)
Prescription refill request for Eliquis received. Indication: Afib  Last office visit: 05/15/22 Domenic Polite) Scr: 0.87 (05/08/22) Age: 81 Weight: 92.2kg  Appropriate dose and refill sent to requested pharmacy.

## 2022-08-01 DIAGNOSIS — E119 Type 2 diabetes mellitus without complications: Secondary | ICD-10-CM | POA: Diagnosis not present

## 2022-11-14 NOTE — Progress Notes (Signed)
Cardiology Office Note  Date: 11/15/2022   ID: Adorah, Nalbone Nov 21, 1940, MRN [id number]  PCP:  Ranae Palms, MD  Cardiologist:  Rozann Lesches, MD Electrophysiologist:  None   Chief Complaint  Patient presents with   Cardiac follow-up    History of Present Illness: Bonnie Gray is an 82 y.o. female last seen in August 2023.  She is here today with family member for follow-up visit.  Reports no interval palpitations or chest pain.  She is following with Dr. Hampton Abbot on a regular basis.  We went over her medications.  She has recently been taking her Eliquis only once daily to save money due to increased cost and also mentions that she typically only takes Lopressor 25 mg once a day.  Per discussion it sounds like it is harder for her to take medications more than once a day.  We discussed changing to Toprol-XL and looking at the samples for Eliquis.  She does not report any spontaneous bleeding problems.  Past Medical History:  Diagnosis Date   Anxiety    Cataract    Chronic pain    Depression    Diverticulitis    Essential hypertension    GERD (gastroesophageal reflux disease)    Hemorrhoids    History of blood transfusion    Macular degeneration    Migraine    Osteoarthritis    Pre-diabetes    Wears dentures     Current Outpatient Medications  Medication Sig Dispense Refill   amoxicillin (AMOXIL) 500 MG capsule Take 1 capsule by mouth. As needed for dental procedures     apixaban (ELIQUIS) 5 MG TABS tablet Take 1 tablet by mouth twice daily 60 tablet 5   diazepam (VALIUM) 2 MG tablet Take 1 tablet by mouth daily as needed.     DM-APAP-CPM (CORICIDIN HBP PO) Take by mouth as needed.     empagliflozin (JARDIANCE) 10 MG TABS tablet Take 1 tablet by mouth daily.     loratadine (CLARITIN) 10 MG tablet Take 1 tablet by mouth daily.     metoprolol succinate (TOPROL XL) 25 MG 24 hr tablet Take 1 tablet (25 mg total) by mouth daily. 90 tablet 1   Multiple  Vitamins-Minerals (PRESERVISION AREDS PO) Take 1 tablet by mouth daily.     No current facility-administered medications for this visit.   Allergies:  Amlodipine besylate-valsartan, Oxycodone hcl, Amlodipine besylate, Hydromorphone, Robaxin [methocarbamol], Amlodipine-atorvastatin, Celecoxib, and Cyclobenzaprine   ROS: No orthopnea or PND.  Physical Exam: VS:  BP (!) 134/90   Pulse 65   Ht 5' 3"$  (1.6 m)   Wt 202 lb 6.4 oz (91.8 kg)   BMI 35.85 kg/m , BMI Body mass index is 35.85 kg/m.  Wt Readings from Last 3 Encounters:  11/15/22 202 lb 6.4 oz (91.8 kg)  05/15/22 203 lb 3.2 oz (92.2 kg)  11/09/21 209 lb (94.8 kg)    General: Patient appears comfortable at rest. HEENT: Conjunctiva and lids normal. Neck: Supple, no elevated JVP or carotid bruits. Lungs: Clear to auscultation, nonlabored breathing at rest. Cardiac: Regular rate and rhythm, no S3, 1/6 systolic murmur. Extremities: No pitting edema.  ECG:  An ECG dated 11/09/2021 was personally reviewed today and demonstrated:  Sinus tachycardia with right bundle branch block and LVH.  Recent Labwork:  February 2023: Cholesterol 207, triglycerides 196, HDL 44, LDL 128 August 2023: Potassium 4.1, BUN 14, creatinine 0.87, hemoglobin 14.4, platelets 205   Other Studies Reviewed Today:  Echocardiogram  12/11/2021:  1. Left ventricular ejection fraction, by estimation, is 60 to 65%. The  left ventricle has normal function. The left ventricle has no regional  wall motion abnormalities. There is moderate left ventricular hypertrophy.  Left ventricular diastolic  parameters are consistent with Grade I diastolic dysfunction (impaired  relaxation). Elevated left atrial pressure.   2. Right ventricular systolic function is normal. The right ventricular  size is normal. Tricuspid regurgitation signal is inadequate for assessing  PA pressure.   3. The mitral valve is abnormal. No evidence of mitral valve  regurgitation. Mild mitral  stenosis. Severe mitral annular calcification.   4. The tricuspid valve is abnormal.   5. The aortic valve has an indeterminant number of cusps. There is  moderate calcification of the aortic valve. There is moderate thickening  of the aortic valve. Aortic valve regurgitation is mild. Aortic valve  sclerosis/calcification is present, without   any evidence of aortic stenosis.   6. Aortic dilatation noted. There is mild dilatation of the ascending  aorta, measuring 37 mm.   7. The inferior vena cava is normal in size with greater than 50%  respiratory variability, suggesting right atrial pressure of 3 mmHg.   Assessment and Plan:  1.  Paroxysmal atrial fibrillation with CHA2DS2-VASc score of 5.  Continue Eliquis 5 mg twice daily, we are looking into samples for her as well.  Change Lopressor to Toprol-XL 25 mg daily.  She continues to follow lab work with Dr. Hampton Abbot, denies any spontaneous bleeding problems.  2.  Essential hypertension, blood pressure mildly elevated today.  Changing beta-blocker therapy as discussed above.  Continue to follow regularly with Dr. Hampton Abbot.  Medication Adjustments/Labs and Tests Ordered: Current medicines are reviewed at length with the patient today.  Concerns regarding medicines are outlined above.   Tests Ordered: Orders Placed This Encounter  Procedures   EKG 12-Lead    Medication Changes: Meds ordered this encounter  Medications   metoprolol succinate (TOPROL XL) 25 MG 24 hr tablet    Sig: Take 1 tablet (25 mg total) by mouth daily.    Dispense:  90 tablet    Refill:  1    11/15/2022 NEW-stop lopressor    Disposition:  Follow up  6 months.  Signed, Satira Sark, MD, [facility] 11/15/2022 11:30 AM    [place] at [place], [place], [place] [address] Phone: [phone]; Fax: [phone]

## 2022-11-15 ENCOUNTER — Other Ambulatory Visit: Payer: Self-pay | Admitting: *Deleted

## 2022-11-15 ENCOUNTER — Ambulatory Visit: Payer: Medicare HMO | Attending: Cardiology | Admitting: Cardiology

## 2022-11-15 ENCOUNTER — Encounter: Payer: Self-pay | Admitting: Cardiology

## 2022-11-15 VITALS — BP 134/90 | HR 65 | Ht 63.0 in | Wt 202.4 lb

## 2022-11-15 DIAGNOSIS — I4819 Other persistent atrial fibrillation: Secondary | ICD-10-CM

## 2022-11-15 DIAGNOSIS — I480 Paroxysmal atrial fibrillation: Secondary | ICD-10-CM

## 2022-11-15 DIAGNOSIS — I10 Essential (primary) hypertension: Secondary | ICD-10-CM | POA: Diagnosis not present

## 2022-11-15 MED ORDER — APIXABAN 5 MG PO TABS: 5.0000 mg | ORAL_TABLET | Freq: Two times a day (BID) | ORAL | 3 refills | Status: DC

## 2022-11-15 MED ORDER — METOPROLOL SUCCINATE ER 25 MG PO TB24: 25.0000 mg | ORAL_TABLET | Freq: Every day | ORAL | 1 refills | Status: DC

## 2022-11-15 MED ORDER — APIXABAN 5 MG PO TABS: 5.0000 mg | ORAL_TABLET | Freq: Two times a day (BID) | ORAL | 0 refills | Status: DC

## 2022-11-15 NOTE — Telephone Encounter (Signed)
Reports Co-pay too expensive BMS-PAF application given Aware to bring 2024 proof of income and prescription out of pocket expense

## 2022-11-15 NOTE — Patient Instructions (Addendum)
Medication Instructions:  Your physician has recommended you make the following change in your medication:  Stop metoprolol tartrate Start metoprolol succinate 25 mg daily Continue other medications the same  Labwork: none  Testing/Procedures: none  Follow-Up: Your physician recommends that you schedule a follow-up appointment in: 6 months  Any Other Special Instructions Will Be Listed Below (If Applicable).  If you need a refill on your cardiac medications before your next appointment, please call your pharmacy.

## 2022-11-19 ENCOUNTER — Telehealth: Payer: Self-pay | Admitting: Cardiology

## 2022-11-19 NOTE — Telephone Encounter (Signed)
Than name of the medication in question is eliquis.

## 2022-11-19 NOTE — Telephone Encounter (Signed)
Estill Bamberg - [facility] calling, she wants to verify which medication we are helping the pt for pt assistance

## 2022-11-20 ENCOUNTER — Telehealth: Payer: Self-pay

## 2022-11-20 DIAGNOSIS — Z596 Low income: Secondary | ICD-10-CM

## 2022-11-20 NOTE — Progress Notes (Signed)
[place] [facility])                                            [place] Team    11/20/2022  Bonnie Gray [birth year] [id number]                                         [place] [facility])                                            [place] Team                                                                         Medication Assistance Referral  Referral From: [place]  Medication/Company: [employer] / [employer] Patient application portion:  Mailed Provider application portion: Faxed  to Dr. Hardie Shackleton Provider address/fax verified via: Call to office  Lelon Huh, [place] [phone]

## 2022-11-20 NOTE — Telephone Encounter (Signed)
Bonnie Gray informed.

## 2023-01-02 ENCOUNTER — Telehealth: Payer: Self-pay | Admitting: Pharmacy Technician

## 2023-01-02 DIAGNOSIS — Z596 Low income: Secondary | ICD-10-CM

## 2023-01-02 NOTE — Progress Notes (Signed)
[facility])                                            [place] Team    01/02/2023  [facility] PIVONKA 03-16-1941 [id number]  Received both patient and provider portion(s) of patient assistance application(s) for Farxiga. Faxed completed application and required documents into [employer].    Miroslava Santellan P. Jericca Russett, [place]  [phone]

## 2023-01-14 ENCOUNTER — Telehealth: Payer: Self-pay | Admitting: Pharmacy Technician

## 2023-01-14 DIAGNOSIS — Z5986 Financial insecurity: Secondary | ICD-10-CM

## 2023-01-14 NOTE — Progress Notes (Signed)
[facility] Customer service manager [facility])                                            [facility] Quality Pharmacy Team    01/14/2023  BRITTANEE VALDIVIA 1940-10-18 [id number]  Care coordination call placed to [employer] in regard to [facility] application.  Spoke to [place] who informs patient is APPROVED 01/02/23-10/07/23. Medication will auto fill and ship to patient's home initially and then again approximately every 3 months until enrollment period ends. If patient at any time feels supply is not adequate until next shipment, then patient can call [employer] at [phone].  Joplin Canty P. Jeryl Wilbourn, CPhT [employer] [employer]  [phone]

## 2023-01-22 ENCOUNTER — Telehealth: Payer: Self-pay

## 2023-01-22 NOTE — Telephone Encounter (Signed)
Eliquis has been approved, free of charge from 01/22/2023 through 10/07/2023 Application Case #: [id number]

## 2023-05-12 ENCOUNTER — Other Ambulatory Visit: Payer: Self-pay | Admitting: Cardiology

## 2023-05-22 ENCOUNTER — Encounter: Payer: Self-pay | Admitting: Cardiology

## 2023-05-22 ENCOUNTER — Ambulatory Visit: Payer: Medicare HMO | Attending: Cardiology | Admitting: Cardiology

## 2023-05-22 VITALS — BP 170/110 | HR 76 | Ht 63.0 in | Wt 209.4 lb

## 2023-05-22 DIAGNOSIS — I10 Essential (primary) hypertension: Secondary | ICD-10-CM

## 2023-05-22 DIAGNOSIS — I480 Paroxysmal atrial fibrillation: Secondary | ICD-10-CM

## 2023-05-22 DIAGNOSIS — Z79899 Other long term (current) drug therapy: Secondary | ICD-10-CM | POA: Diagnosis not present

## 2023-05-22 MED ORDER — LOSARTAN POTASSIUM 25 MG PO TABS: 25.0000 mg | ORAL_TABLET | Freq: Every day | ORAL | 3 refills | Status: DC

## 2023-05-22 NOTE — Progress Notes (Signed)
Cardiology Office Note  Date: 05/22/2023   ID: Bonnie, Gray 29-Mar-1941, MRN [id number]  History of Present Illness: Bonnie Gray is an 82 y.o. female last seen in February.  She is here today with her daughter for a follow-up visit.  Reports no palpitations or chest pain.  States that she is following regularly with Dr. Willaim Bane.  Blood pressure control remains an issue, she states that her systolic is as high as 200 when she checks it at home.  I went over her medications, she is tolerating Toprol-XL along with Eliquis.  She has prior reported allergy to amlodipine/Lipitor listed as rash, also intolerance to amlodipine/valsartan listed as flushing and headache.  She has not yet been tried on any other antihypertensive agents by her PCP.  I reviewed her lab work from August of last year.  Renal function and potassium were normal at that time.  Physical Exam: VS:  BP (!) 170/110 (BP Location: Right Arm)   Pulse 76   Ht 5\' 3"  (1.6 m)   Wt 209 lb 6.4 oz (95 kg)   SpO2 95%   BMI 37.09 kg/m , BMI Body mass index is 37.09 kg/m.  Wt Readings from Last 3 Encounters:  05/22/23 209 lb 6.4 oz (95 kg)  11/15/22 202 lb 6.4 oz (91.8 kg)  05/15/22 203 lb 3.2 oz (92.2 kg)    General: Patient appears comfortable at rest. HEENT: Conjunctiva and lids normal. Neck: Supple, no elevated JVP or carotid bruits. Lungs: Clear to auscultation, nonlabored breathing at rest. Cardiac: Regular rate and rhythm, no S3, 1/6 systolic murmur. Extremities: No pitting edema.  ECG:  An ECG dated 11/15/2022 was personally reviewed today and demonstrated:  Sinus rhythm with right bundle branch block.  Labwork:  August 2023: Potassium 4.1, BUN 14, creatinine 0.87, hemoglobin 14.4, platelets 205  Other Studies Reviewed Today:  Echocardiogram 12/11/2021:  1. Left ventricular ejection fraction, by estimation, is 60 to 65%. The  left ventricle has normal function. The left ventricle has no regional   wall motion abnormalities. There is moderate left ventricular hypertrophy.  Left ventricular diastolic  parameters are consistent with Grade I diastolic dysfunction (impaired  relaxation). Elevated left atrial pressure.   2. Right ventricular systolic function is normal. The right ventricular  size is normal. Tricuspid regurgitation signal is inadequate for assessing  PA pressure.   3. The mitral valve is abnormal. No evidence of mitral valve  regurgitation. Mild mitral stenosis. Severe mitral annular calcification.   4. The tricuspid valve is abnormal.   5. The aortic valve has an indeterminant number of cusps. There is  moderate calcification of the aortic valve. There is moderate thickening  of the aortic valve. Aortic valve regurgitation is mild. Aortic valve  sclerosis/calcification is present, without   any evidence of aortic stenosis.   6. Aortic dilatation noted. There is mild dilatation of the ascending  aorta, measuring 37 mm.   7. The inferior vena cava is normal in size with greater than 50%  respiratory variability, suggesting right atrial pressure of 3 mmHg.   Assessment and Plan:  1.  Paroxysmal atrial fibrillation with CHA2DS2-VASc score of 5.  Continue Eliquis for stroke prophylaxis, she denies any bleeding problems.  Tolerating Toprol-XL as well.  She does not describe any increasing palpitations.   2.  Essential hypertension.  Uncontrolled.  Plan to initiate Cozaar 25 mg daily, check BMET in 2 weeks.  If tolerated, continue to track blood pressure and titrate as  needed.  Disposition:  Follow up  6 months.  Signed, Jonelle Sidle, M.D., F.A.C.C. [facility]

## 2023-05-22 NOTE — Patient Instructions (Signed)
Medication Instructions:   START Cozaar 25 mg once a day    Labwork: BMET in 2 weeks (8/29)  Testing/Procedures: None today  Follow-Up: 6 months  Any Other Special Instructions Will Be Listed Below (If Applicable).  If you need a refill on your cardiac medications before your next appointment, please call your pharmacy.

## 2023-05-22 NOTE — Addendum Note (Signed)
Addended by: Marlyn Corporal A on: 05/22/2023 11:10 AM   Modules accepted: Orders

## 2023-05-23 ENCOUNTER — Telehealth: Payer: Self-pay | Admitting: Cardiology

## 2023-05-23 NOTE — Telephone Encounter (Signed)
[facility] spoke to [employer]- Asked to see why the Losartan wasn't able to be filled? Explained that patient's helper was told it was due to an allergy, he stated that there wasn't any issue as far as allergies. We didn't have it listed either. He would approved request and fill medication. Called patient to advised her that medication will be filled.

## 2023-05-23 NOTE — Telephone Encounter (Signed)
Pt c/o medication issue:  1. Name of Medication: losartan (COZAAR) 25 MG tablet   2. How are you currently taking this medication (dosage and times per day)?   3. Are you having a reaction (difficulty breathing--STAT)?   4. What is your medication issue? Patient states she was written a script for this medication yesterday and the pharmacy wouldn't fill it because they state it's listed on her allergy list.  She not sure if it's on her list or if she is allergic to one of the ingredients in it. Please advise.

## 2023-05-28 NOTE — Addendum Note (Signed)
Addended by: Eustace Moore on: 05/28/2023 02:56 PM   Modules accepted: Orders

## 2023-06-18 ENCOUNTER — Telehealth: Payer: Self-pay | Admitting: Cardiology

## 2023-06-18 NOTE — Telephone Encounter (Signed)
Patient returned call for her test results.  

## 2023-06-18 NOTE — Telephone Encounter (Signed)
Patient verbalized understanding of results  

## 2023-08-18 ENCOUNTER — Other Ambulatory Visit: Payer: Self-pay | Admitting: Cardiology

## 2023-10-23 ENCOUNTER — Telehealth: Payer: Self-pay | Admitting: Cardiology

## 2023-10-23 NOTE — Telephone Encounter (Signed)
New Message:    Dr Doreene Eland called for Dr Diona Browner. He said since Dr Diona Browner was not her, he would talk to his nurse. I offer to get another doctor for him,

## 2023-10-23 NOTE — Telephone Encounter (Signed)
Dr. Doreene Eland would like to speak with provider regarding echo results done today at [facility]. Please call [facility] ED @[phone].

## 2023-10-24 ENCOUNTER — Telehealth: Payer: Self-pay | Admitting: Cardiology

## 2023-10-24 NOTE — Telephone Encounter (Signed)
Sarah from home health calling to talk with Dr. Diona Browner and nurse patient is currently in the hospital at [facility]. There for cardiac related issue. Discharge will be today or tomorrow. Please call [phone]

## 2023-10-27 NOTE — Telephone Encounter (Signed)
Issue taken care of - hospitalist agreed to take care of her home health orders.

## 2023-11-25 ENCOUNTER — Encounter: Payer: Self-pay | Admitting: *Deleted

## 2023-11-27 ENCOUNTER — Encounter: Payer: Self-pay | Admitting: Cardiology

## 2023-11-27 ENCOUNTER — Ambulatory Visit: Payer: Medicare HMO | Attending: Cardiology | Admitting: Cardiology

## 2023-11-28 ENCOUNTER — Ambulatory Visit: Payer: Medicare HMO | Admitting: Nurse Practitioner

## 2024-01-30 ENCOUNTER — Encounter: Payer: Self-pay | Admitting: Cardiology

## 2024-01-30 ENCOUNTER — Telehealth: Payer: Self-pay | Admitting: Pharmacy Technician

## 2024-01-30 ENCOUNTER — Ambulatory Visit: Payer: Medicare HMO | Attending: Cardiology | Admitting: Cardiology

## 2024-01-30 VITALS — BP 140/82 | HR 75 | Ht 62.0 in | Wt 201.8 lb

## 2024-01-30 DIAGNOSIS — I4819 Other persistent atrial fibrillation: Secondary | ICD-10-CM

## 2024-01-30 DIAGNOSIS — I5032 Chronic diastolic (congestive) heart failure: Secondary | ICD-10-CM

## 2024-01-30 DIAGNOSIS — I10 Essential (primary) hypertension: Secondary | ICD-10-CM

## 2024-01-30 DIAGNOSIS — Z7901 Long term (current) use of anticoagulants: Secondary | ICD-10-CM

## 2024-01-30 MED ORDER — DAPAGLIFLOZIN PROPANEDIOL 10 MG PO TABS
10.0000 mg | ORAL_TABLET | Freq: Every day | ORAL | Status: AC
Start: 2024-01-30 — End: ?

## 2024-01-30 MED ORDER — APIXABAN 5 MG PO TABS: 5.0000 mg | ORAL_TABLET | Freq: Two times a day (BID) | ORAL | Status: DC

## 2024-01-30 NOTE — Progress Notes (Signed)
    Cardiology Office Note  Date: 01/30/2024   ID: [name], [name] 11/03/1940, MRN [id number]  History of Present Illness: Bonnie Gray is an 83 y.o. female last seen in August 2024.  She is here today with her daughter for a follow-up visit.  She states that she has had trouble with balance and leg weakness, also arthritic pains.  She had a fall recently as well.  She does not report any sense of palpitations or chest pain.  We went over her medications.  She has no more Eliquis  left to refill, states that she cannot afford it now.  Also off [place].  She has Lasix 20 mg tablets with KCl 20 mEq tablets to take as needed.  Leg swelling has been stable and her weight has also not climbed significantly.  I reviewed her echocardiogram done at [facility] back in January during hospital stay.  She had evidence of fluid retention at that time, HFpEF with LVEF 65 to 70%.  Physical Exam: VS:  BP (!) 140/82   Pulse 75   Ht 5\' 2"  (1.575 m)   Wt 201 lb 12.8 oz (91.5 kg)   SpO2 94%   BMI 36.91 kg/m , BMI Body mass index is 36.91 kg/m.  Wt Readings from Last 3 Encounters:  01/30/24 201 lb 12.8 oz (91.5 kg)  05/22/23 209 lb 6.4 oz (95 kg)  11/15/22 202 lb 6.4 oz (91.8 kg)    General: Patient appears comfortable at rest. HEENT: Conjunctiva and lids normal. Neck: Supple, no elevated JVP or carotid bruits. Lungs: Clear to auscultation, nonlabored breathing at rest. Cardiac: Regular rate and rhythm, no S3, 1/6 systolic murmur. Extremities: No pitting edema.  ECG:  An ECG dated 10/23/2023 was personally reviewed today and demonstrated:  Sinus bradycardia with PACs, incomplete right bundle branch block.  Labwork:  January 2025: Potassium 3.0, BUN 17, creatinine 0.84, magnesium  1.8, hemoglobin 13.8, platelets 226  Other Studies Reviewed Today:  Echocardiogram 10/23/2023 [facility]): Summary   1. The left ventricle is normal in size with mildly increased wall   thickness.   2. The left ventricular systolic function is normal, LVEF is visually  estimated at 65-70%.    3. Mitral annular calcification is present (moderate).    4. The aortic valve is probably trileaflet with moderately thickened  leaflets with mildly reduced excursion.    5. There is mild aortic regurgitation.    6. The right ventricle is normal in size, with normal systolic function.    7. The aorta at the ascending aorta is mildly dilated.   Assessment and Plan:  1.  Paroxysmal atrial fibrillation with CHA2DS2-VASc score of 5.  Heart rate is regular today and she does not report any significant palpitations.  Plan to look into assistance program for Eliquis .  Otherwise continue Toprol  XL 25 mg daily.   2.  Primary hypertension.  Blood pressure control better since addition of Cozaar  25 mg daily.  3.  Intermittent fluid retention with HFpEF, LVEF 65 to 70% by echocardiogram done at [facility] in January.  Would presume Farxiga 10 mg daily.  She has Lasix 20 mg tablets and KCl 20 mEq tablets to take as needed.  Disposition:  Follow up  6 months.  Signed, Gerard Knight, M.D., F.A.C.C.  [facility] at [facility]

## 2024-01-30 NOTE — Patient Instructions (Addendum)
 Medication Instructions:  Your physician recommends that you continue on your current medications as directed. Please refer to the Current Medication list given to you today. You have been referred to our medication assistance department.  Labwork: none  Testing/Procedures: none  Follow-Up: Your physician recommends that you schedule a follow-up appointment in: 6 months  Any Other Special Instructions Will Be Listed Below (If Applicable).  If you need a refill on your cardiac medications before your next appointment, please call your pharmacy.

## 2024-01-30 NOTE — Telephone Encounter (Signed)
 PAP: Patient assistance application for Eliquis  through [employer] ([employer]) has been mailed to pt's home address on file. Provider portion of application will be faxed to provider's office.   I called pt and made her aware

## 2024-01-30 NOTE — Telephone Encounter (Signed)
 PAP: Patient assistance application for Farxiga through [employer] ([employer]) has been mailed to pt's home address on file. Provider portion of application will be faxed to provider's office.

## 2024-02-19 NOTE — Telephone Encounter (Signed)
 Signed and faxed to provided fax number

## 2024-02-19 NOTE — Telephone Encounter (Signed)
 Faxed application but missing oop and proof of income. Lmom for patient

## 2024-02-19 NOTE — Telephone Encounter (Signed)
 Spoke to patient and she will find her proof and send to me or come in the office to drop off

## 2024-02-19 NOTE — Telephone Encounter (Signed)
 Hi in media under "ELIQUIS  [employer] BLANK PROVIDER FORM" there is a blank provider form if someone can get the provider to sign and fax back to [phone] please and thank you!

## 2024-02-19 NOTE — Telephone Encounter (Signed)
 Hi, scanned in media under "FARXIGA  AZME BLANK PROVIDER FORM" is the provider form if someone can get the provider to sign and fax back to [phone] please and thank you!

## 2024-02-23 NOTE — Telephone Encounter (Signed)
 Hi, just following up on the farxiga  provider form being signed. Thank you! (We received the eliquis  one just not the farxiga  one)

## 2024-02-24 NOTE — Telephone Encounter (Signed)
 Completed and faxed to [facility] team @[phone].

## 2024-02-24 NOTE — Telephone Encounter (Signed)
 Faxed azme farxiga  app missing oop and proof

## 2024-02-24 NOTE — Telephone Encounter (Signed)
 02/19/24  5:39 PM Note Spoke to patient and she will find her proof and send to me or come in the office to drop off

## 2024-02-24 NOTE — Telephone Encounter (Signed)
 02/19/24  5:39 PM Note Spoke to patient and she will find her proof and send to me or come in the office to drop off      I called the patient to check on proof of income and oop. She said her daughter couldn't get the oop. She said there wouldn't be much on her oop since she has been getting her meds as samples. She will see if she can send me the proof of income. She has to get with her daughter

## 2024-02-25 ENCOUNTER — Other Ambulatory Visit (HOSPITAL_COMMUNITY): Payer: Self-pay

## 2024-02-25 NOTE — Telephone Encounter (Signed)
 Bonnie Gray

## 2024-02-25 NOTE — Telephone Encounter (Signed)
 Hi, I spoke to the patient and she said she would like to try warfarin. I am sending to see if that is ok? The patient is aware if she goes to warfarin she would have to be check routinely. She is available to talk about this as well at [phone]. She said the other options are not affordable even with the medicare payment plan. Thank you!

## 2024-02-25 NOTE — Telephone Encounter (Signed)
I called the patient and she is aware

## 2024-02-26 ENCOUNTER — Other Ambulatory Visit (HOSPITAL_COMMUNITY): Payer: Self-pay

## 2024-03-02 DIAGNOSIS — Z7901 Long term (current) use of anticoagulants: Secondary | ICD-10-CM | POA: Insufficient documentation

## 2024-03-02 NOTE — Telephone Encounter (Signed)
 Patient daughter informed and verbalized understanding of plan. Referral placed for patient to begin coumadin

## 2024-03-02 NOTE — Addendum Note (Signed)
 Addended by: Lysander Calixte on: 03/02/2024 02:18 PM   Modules accepted: Orders

## 2024-03-02 NOTE — Telephone Encounter (Signed)
 Left message for patient to call back

## 2024-03-08 ENCOUNTER — Ambulatory Visit: Attending: Internal Medicine | Admitting: *Deleted

## 2024-03-08 DIAGNOSIS — Z7901 Long term (current) use of anticoagulants: Secondary | ICD-10-CM | POA: Diagnosis not present

## 2024-03-08 DIAGNOSIS — I4819 Other persistent atrial fibrillation: Secondary | ICD-10-CM | POA: Diagnosis not present

## 2024-03-08 DIAGNOSIS — I4891 Unspecified atrial fibrillation: Secondary | ICD-10-CM | POA: Insufficient documentation

## 2024-03-08 DIAGNOSIS — Z5181 Encounter for therapeutic drug level monitoring: Secondary | ICD-10-CM | POA: Diagnosis not present

## 2024-03-08 LAB — POCT INR: INR: 1 — AB (ref 2.0–3.0)

## 2024-03-08 MED ORDER — WARFARIN SODIUM 5 MG PO TABS: 5.0000 mg | ORAL_TABLET | Freq: Every day | ORAL | 3 refills | Status: DC

## 2024-03-08 NOTE — Patient Instructions (Signed)
 Starting 6/3 overlap Eliquid 5mg  twice daily with warfarin 5mg  daily.  Starting 6/6 stop Eliquis  and continue only warfarin 5mg  daily in the evening. Recheck INR on 03/15/24

## 2024-03-15 ENCOUNTER — Ambulatory Visit: Attending: Internal Medicine | Admitting: *Deleted

## 2024-03-15 DIAGNOSIS — I4819 Other persistent atrial fibrillation: Secondary | ICD-10-CM

## 2024-03-15 DIAGNOSIS — Z7901 Long term (current) use of anticoagulants: Secondary | ICD-10-CM | POA: Diagnosis not present

## 2024-03-15 DIAGNOSIS — Z5181 Encounter for therapeutic drug level monitoring: Secondary | ICD-10-CM | POA: Diagnosis not present

## 2024-03-15 LAB — POCT INR: INR: 1.3 — AB (ref 2.0–3.0)

## 2024-03-15 NOTE — Patient Instructions (Signed)
 Increase warfarin to 1 tablet daily except 1 1/2 tablets on Mondays, Wednesdays and Fridays Recheck in 1 week.

## 2024-03-24 ENCOUNTER — Ambulatory Visit: Attending: Cardiology

## 2024-03-24 DIAGNOSIS — Z7901 Long term (current) use of anticoagulants: Secondary | ICD-10-CM | POA: Diagnosis not present

## 2024-03-24 DIAGNOSIS — Z5181 Encounter for therapeutic drug level monitoring: Secondary | ICD-10-CM

## 2024-03-24 DIAGNOSIS — I4819 Other persistent atrial fibrillation: Secondary | ICD-10-CM

## 2024-03-24 LAB — POCT INR: INR: 2.4 (ref 2.0–3.0)

## 2024-03-24 NOTE — Patient Instructions (Signed)
 Description   Continue on same dosage of Warfarin 1 tablet daily except 1 1/2 tablets on Mondays, Wednesdays and Fridays Recheck in 1 week.

## 2024-03-31 ENCOUNTER — Ambulatory Visit: Attending: Cardiology | Admitting: *Deleted

## 2024-03-31 DIAGNOSIS — Z7901 Long term (current) use of anticoagulants: Secondary | ICD-10-CM | POA: Diagnosis not present

## 2024-03-31 DIAGNOSIS — I4819 Other persistent atrial fibrillation: Secondary | ICD-10-CM

## 2024-03-31 DIAGNOSIS — Z5181 Encounter for therapeutic drug level monitoring: Secondary | ICD-10-CM

## 2024-03-31 LAB — POCT INR: INR: 2 (ref 2.0–3.0)

## 2024-03-31 NOTE — Patient Instructions (Signed)
 Continue on same dosage of Warfarin 1 tablet daily except 1 1/2 tablets on Mondays, Wednesdays and Fridays Recheck in 2 weeks.

## 2024-03-31 NOTE — Progress Notes (Signed)
Please see anticoagulation encounter.

## 2024-04-01 ENCOUNTER — Telehealth: Payer: Self-pay | Admitting: Cardiology

## 2024-04-01 DIAGNOSIS — I4819 Other persistent atrial fibrillation: Secondary | ICD-10-CM

## 2024-04-01 MED ORDER — WARFARIN SODIUM 5 MG PO TABS
ORAL_TABLET | ORAL | 1 refills | Status: AC
Start: 2024-04-01 — End: ?

## 2024-04-01 NOTE — Telephone Encounter (Signed)
 Called and spoke with pt. Pt states she only has 3 tablets of Warfarin left and needs a refill. Refill sent to requested pharmacy.

## 2024-04-01 NOTE — Telephone Encounter (Signed)
 New Message:      Pt c/o medication issue:  1. Name of Medication: Warfarin  2. How are you currently taking this medication (dosage and times per day)?  3. Are you having a reaction (difficulty breathing--STAT)?   4. What is your medication issue? Patient says she have some questions about her Warfarin

## 2024-04-13 ENCOUNTER — Ambulatory Visit: Attending: Cardiology | Admitting: *Deleted

## 2024-04-13 DIAGNOSIS — I4819 Other persistent atrial fibrillation: Secondary | ICD-10-CM | POA: Diagnosis not present

## 2024-04-13 DIAGNOSIS — Z7901 Long term (current) use of anticoagulants: Secondary | ICD-10-CM

## 2024-04-13 DIAGNOSIS — Z5181 Encounter for therapeutic drug level monitoring: Secondary | ICD-10-CM

## 2024-04-13 LAB — POCT INR: INR: 1.6 — AB (ref 2.0–3.0)

## 2024-04-13 NOTE — Patient Instructions (Signed)
 Increase warfarin to 1 1/2 tablets daily except 1 tablet on Sundays Recheck in 2 weeks.

## 2024-04-13 NOTE — Progress Notes (Signed)
Please see anticoagulation encounter.

## 2024-04-27 ENCOUNTER — Other Ambulatory Visit: Payer: Self-pay

## 2024-04-27 MED ORDER — LOSARTAN POTASSIUM 25 MG PO TABS: 25.0000 mg | ORAL_TABLET | Freq: Every day | ORAL | 2 refills | Status: DC

## 2024-04-28 ENCOUNTER — Ambulatory Visit: Attending: Cardiology | Admitting: *Deleted

## 2024-04-28 DIAGNOSIS — Z5181 Encounter for therapeutic drug level monitoring: Secondary | ICD-10-CM

## 2024-04-28 DIAGNOSIS — I4819 Other persistent atrial fibrillation: Secondary | ICD-10-CM | POA: Diagnosis not present

## 2024-04-28 DIAGNOSIS — Z7901 Long term (current) use of anticoagulants: Secondary | ICD-10-CM | POA: Diagnosis not present

## 2024-04-28 LAB — POCT INR: INR: 1.7 — AB (ref 2.0–3.0)

## 2024-04-28 NOTE — Progress Notes (Signed)
 Please see anticoagulation encounter 1.7

## 2024-04-28 NOTE — Patient Instructions (Signed)
 Increase warfarin to 1 1/2 tablets daily except 2 tablets on Mondays, Wednesdays and Fridays Recheck in 2 weeks.

## 2024-04-30 ENCOUNTER — Telehealth: Payer: Self-pay | Admitting: Pharmacy Technician

## 2024-04-30 NOTE — Telephone Encounter (Signed)
 Received shipment notification that shipment has been mailed to patient 's home:

## 2024-04-30 NOTE — ED Provider Notes (Signed)
 Emergency Department Provider Note    ED Clinical Impression   Final diagnoses:  Fall, initial encounter (Primary)  Lumbar strain, initial encounter  Contusion of right hip, initial encounter    ED Assessment/Plan    History   Chief Complaint  Patient presents with  . 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  She complains of back and right hip pain.  The patient required extrication by EMS.    Past Medical History[1]  Past Surgical History[2]  Family History[3]  Social History[4]  Review of Systems  Constitutional:  Negative for fever.  Respiratory:  Negative for shortness of breath.   Cardiovascular:  Negative for chest pain.  Gastrointestinal:  Negative for abdominal pain.  Musculoskeletal:  Negative for neck pain.  Neurological:  Positive for numbness (Chronic, both legs). Negative for syncope.    Physical Exam   BP 207/170   Pulse 62   Temp 36.6 C (97.8 F) (Oral)   Resp 16   Ht 162.6 cm (5' 4)   Wt 95.3 kg (210 lb)   SpO2 94%   BMI 36.05 kg/m   Physical Exam Vitals and nursing note reviewed.  Constitutional:      General: She is in acute distress (Mild to moderate).  HENT:     Head: Normocephalic and atraumatic.  Cardiovascular:     Rate and Rhythm: Normal rate and regular rhythm.  Pulmonary:     Effort: Pulmonary effort is normal.     Breath sounds: Normal breath sounds.  Musculoskeletal:     Right lower leg: Edema (3+) present.     Left lower leg: Edema (3+) present.     Comments: Moderate lumbar and bilateral SI tenderness, mild thoracic midline tenderness.  Straight leg raising positive bilateral.  Distal neurovascular intact  in the extremities  Neurological:     General: No focal deficit present.     Mental Status: She is alert and oriented to person, place, and time.     Motor: Weakness (Generalized) present.     ED Course   ED Course as of 04/30/24 1511  Fri Apr 30, 2024  0803 After x-rays the patient is experiencing more pain.  The patient her daughter and I discussed allergies.  IV fentanyl  will be given  0820 Pain improved after IV fentanyl .  The patient is more comfortable but her O2 sat is 89%.  O2 ordered.  1039 The patient required sedation for an MRI.  Additional fentanyl  ordered     Medical Decision Making The patient suffered a slip and fall with a background of arthritis and chronic back pain secondary to remote injuries; the concern is for soft tissue injury, fracture/dislocation involving the pelvis hip and spine.   X-ray of the hip and lumbar spine showed no acute fracture or dislocation.  Anterolisthesis was noted.  Subsequent MRI showed no acute fracture or dislocation The patient's pain was treated successfully with fentanyl , but she and her daughter insisted that she has been exposed to multiple pain medications in the past and only incur side effects.  Steroids and Tylenol  will be used to treat the patient's pain. Case management consult was placed because of the patient was a fall risk and  has difficulty ambulating.                 [1] Past Medical History: Diagnosis Date  . A-fib      . Arthritis   . Diabetes mellitus      . Hypertension   [2] Past Surgical History: Procedure Laterality Date  . hip replacment    . REPLACEMENT TOTAL KNEE    [3] History reviewed. No pertinent family history. [4] Social History Socioeconomic History  . Marital status: Divorced    Spouse name: None  . Number of children: None  . Years of education: None  . Highest education level: None  Tobacco Use  . Smoking status: Never  . Smokeless tobacco: Never  Substance and Sexual  Activity  . Alcohol  use: Never  . Drug use: Never  . Sexual activity: Not Currently   Social Drivers of Health   Financial Resource Strain: Medium Risk (10/24/2023)   Overall Financial Resource Strain (CARDIA)   . Difficulty of Paying Living Expenses: Somewhat hard  Food Insecurity: Food Insecurity Present (10/24/2023)   Hunger Vital Sign   . Worried About Programme researcher, broadcasting/film/video in the Last Year: Sometimes true   . Ran Out of Food in the Last Year: Sometimes true  Transportation Needs: No Transportation Needs (10/24/2023)   PRAPARE - Transportation   . Lack of Transportation (Medical): No   . Lack of Transportation (Non-Medical): No  Physical Activity: Inactive (10/24/2023)   Exercise Vital Sign   . Days of Exercise per Week: 0 days   . Minutes of Exercise per Session: 0 min  Stress: No Stress Concern Present (10/24/2023)   [employer] of Occupational Health - Occupational Stress Questionnaire   . Feeling of Stress : Not at all  Social Connections: Socially Isolated (10/24/2023)   Social Connection and Isolation Panel   . Frequency of Social Gatherings with Friends and Family: More than three times a week   . Attends Religious Services: Never   . Active Member of Clubs or Organizations: No   . Attends Banker Meetings: Never   . Marital Status: Divorced  Housing: Low Risk  (10/24/2023)   Housing   . Within the past 12 months, have you ever stayed: outside, in a car, in a tent, in an overnight shelter, or temporarily in someone else's home (i.e. couch-surfing)?: No   . Are you worried about losing your housing?: No   Rosamond Lyndy Beagle, MD 04/30/24 1511

## 2024-05-12 ENCOUNTER — Ambulatory Visit: Attending: Cardiology | Admitting: *Deleted

## 2024-05-12 DIAGNOSIS — I4819 Other persistent atrial fibrillation: Secondary | ICD-10-CM | POA: Diagnosis not present

## 2024-05-12 DIAGNOSIS — Z5181 Encounter for therapeutic drug level monitoring: Secondary | ICD-10-CM

## 2024-05-12 DIAGNOSIS — Z7901 Long term (current) use of anticoagulants: Secondary | ICD-10-CM | POA: Diagnosis not present

## 2024-05-12 LAB — POCT INR: INR: 4.6 — AB (ref 2.0–3.0)

## 2024-05-12 NOTE — Patient Instructions (Signed)
 Hold warfarin tonight and tomorrow night then decrease dose to 1 1/2 tablets daily except 2 tablets on Mondays Recheck in 1 week

## 2024-05-12 NOTE — Progress Notes (Signed)
 INR 4.6 Please see anticoagulation encounter

## 2024-05-19 ENCOUNTER — Ambulatory Visit: Attending: Cardiology | Admitting: *Deleted

## 2024-05-19 DIAGNOSIS — I4819 Other persistent atrial fibrillation: Secondary | ICD-10-CM | POA: Diagnosis not present

## 2024-05-19 DIAGNOSIS — Z7901 Long term (current) use of anticoagulants: Secondary | ICD-10-CM

## 2024-05-19 DIAGNOSIS — Z5181 Encounter for therapeutic drug level monitoring: Secondary | ICD-10-CM | POA: Diagnosis not present

## 2024-05-19 LAB — POCT INR: INR: 1.4 — AB (ref 2.0–3.0)

## 2024-05-19 NOTE — Progress Notes (Signed)
 INR 1.4. Please see anticoagulation encounter

## 2024-05-19 NOTE — Patient Instructions (Signed)
 Take warfarin 2 tablets tonight then increase dose to 1 1/2 tablets daily except 2 tablets on Mondays and Thursdays Recheck in 2 week

## 2024-06-01 ENCOUNTER — Ambulatory Visit: Attending: Cardiology | Admitting: *Deleted

## 2024-06-01 DIAGNOSIS — Z7901 Long term (current) use of anticoagulants: Secondary | ICD-10-CM | POA: Diagnosis not present

## 2024-06-01 DIAGNOSIS — I4819 Other persistent atrial fibrillation: Secondary | ICD-10-CM | POA: Diagnosis not present

## 2024-06-01 DIAGNOSIS — Z5181 Encounter for therapeutic drug level monitoring: Secondary | ICD-10-CM | POA: Diagnosis not present

## 2024-06-01 LAB — POCT INR: INR: 2.1 (ref 2.0–3.0)

## 2024-06-01 NOTE — Patient Instructions (Signed)
 Continue warfarin 1 1/2 tablets daily except 2 tablets on Mondays and Thursdays Recheck in 3 weeks

## 2024-06-01 NOTE — Progress Notes (Signed)
 INR 2.1; Please see anticoagulation encounter

## 2024-06-22 ENCOUNTER — Ambulatory Visit: Attending: Cardiology

## 2024-06-23 ENCOUNTER — Encounter: Payer: Self-pay | Admitting: *Deleted

## 2024-07-09 ENCOUNTER — Telehealth: Payer: Self-pay | Admitting: Cardiology

## 2024-07-09 NOTE — Telephone Encounter (Signed)
 Daughter says patient missed 9/16 Coumadin  because she had a brain bleed and was rushed to the hospital. She is currently admitted in [facility].

## 2024-07-12 NOTE — Telephone Encounter (Signed)
 Noted.  Will resume INR checks when patient is discharged from Rehab.

## 2024-08-13 ENCOUNTER — Other Ambulatory Visit: Payer: Self-pay | Admitting: Cardiology

## 2024-08-16 ENCOUNTER — Other Ambulatory Visit: Payer: Self-pay

## 2024-08-16 MED ORDER — LOSARTAN POTASSIUM 25 MG PO TABS
25.0000 mg | ORAL_TABLET | Freq: Every day | ORAL | 1 refills | Status: AC
Start: 2024-08-16 — End: ?

## 2024-08-18 NOTE — Telephone Encounter (Signed)
 Are we managing this? I do not see where we filled. Ok for refill?

## 2024-08-19 MED ORDER — FUROSEMIDE 20 MG PO TABS: 20.0000 mg | ORAL_TABLET | Freq: Every day | ORAL | 6 refills | Status: AC | PRN

## 2024-09-22 ENCOUNTER — Other Ambulatory Visit: Payer: Self-pay | Admitting: *Deleted

## 2024-09-22 MED ORDER — DAPAGLIFLOZIN PROPANEDIOL 10 MG PO TABS: 10.0000 mg | ORAL_TABLET | Freq: Every day | ORAL | 0 refills | Status: AC
